# Patient Record
Sex: Female | Born: 1960 | Race: White | Hispanic: No | Marital: Married | Smoking: Former smoker
Health system: Southern US, Community
[De-identification: ages and names within clinical notes are randomized; demographics above are authoritative.]

## PROBLEM LIST (undated history)

## (undated) DIAGNOSIS — I10 Essential (primary) hypertension: Secondary | ICD-10-CM

## (undated) DIAGNOSIS — E785 Hyperlipidemia, unspecified: Secondary | ICD-10-CM

## (undated) DIAGNOSIS — I219 Acute myocardial infarction, unspecified: Secondary | ICD-10-CM

## (undated) HISTORY — DX: Hyperlipidemia, unspecified: E78.5

## (undated) HISTORY — DX: Essential (primary) hypertension: I10

## (undated) HISTORY — DX: Acute myocardial infarction, unspecified: I21.9

## (undated) HISTORY — PX: SPINE SURGERY: SHX786

---

## 1999-05-10 ENCOUNTER — Ambulatory Visit (HOSPITAL_COMMUNITY): Admission: RE | Admit: 1999-05-10 | Discharge: 1999-05-11 | Payer: Self-pay | Admitting: Neurosurgery

## 1999-07-21 ENCOUNTER — Encounter: Payer: Self-pay | Admitting: Urology

## 1999-07-21 ENCOUNTER — Encounter: Admission: RE | Admit: 1999-07-21 | Discharge: 1999-07-21 | Payer: Self-pay | Admitting: Urology

## 2007-02-21 ENCOUNTER — Other Ambulatory Visit: Payer: Self-pay

## 2007-02-21 ENCOUNTER — Emergency Department: Payer: Self-pay | Admitting: Internal Medicine

## 2007-02-21 DIAGNOSIS — I219 Acute myocardial infarction, unspecified: Secondary | ICD-10-CM

## 2007-02-21 HISTORY — DX: Acute myocardial infarction, unspecified: I21.9

## 2007-05-09 ENCOUNTER — Ambulatory Visit: Payer: Self-pay | Admitting: Internal Medicine

## 2007-05-09 LAB — HM MAMMOGRAPHY: HM Mammogram: NORMAL

## 2009-10-21 ENCOUNTER — Ambulatory Visit: Payer: Self-pay | Admitting: Internal Medicine

## 2009-10-27 ENCOUNTER — Ambulatory Visit: Payer: Self-pay | Admitting: Internal Medicine

## 2009-11-05 ENCOUNTER — Ambulatory Visit: Payer: Self-pay | Admitting: Internal Medicine

## 2010-09-30 IMAGING — CR DG KNEE COMPLETE 4+V*R*
1 series · 4 of 4 positions shown · non-contrast
Comparison: none

REASON FOR EXAM: fell twisted not able to bear weight
COMMENTS:

PROCEDURE:     MDR - MDR KNEE RT COMPLETE W/OBLIQUES  - October 21, 2009  [DATE]
RESULT:     No fracture, dislocation or other acute bony abnormality is
identified. The knee joint space is well maintained. The patella is intact.

[Series 1: view not recorded · 0.17mm/px · 4 of 4 slices shown]
[im 1/4]
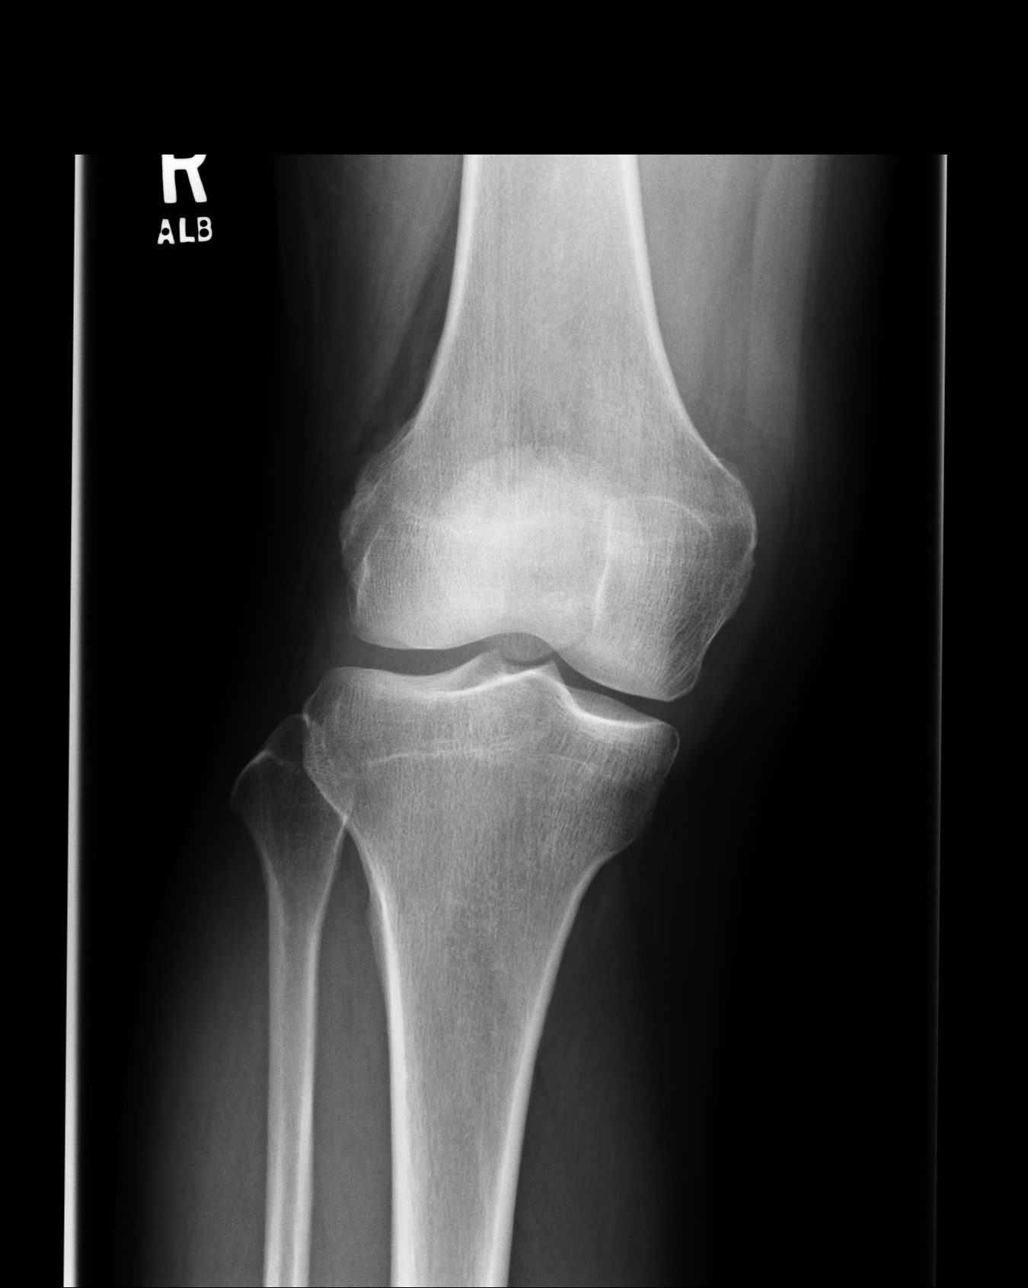
[im 2/4]
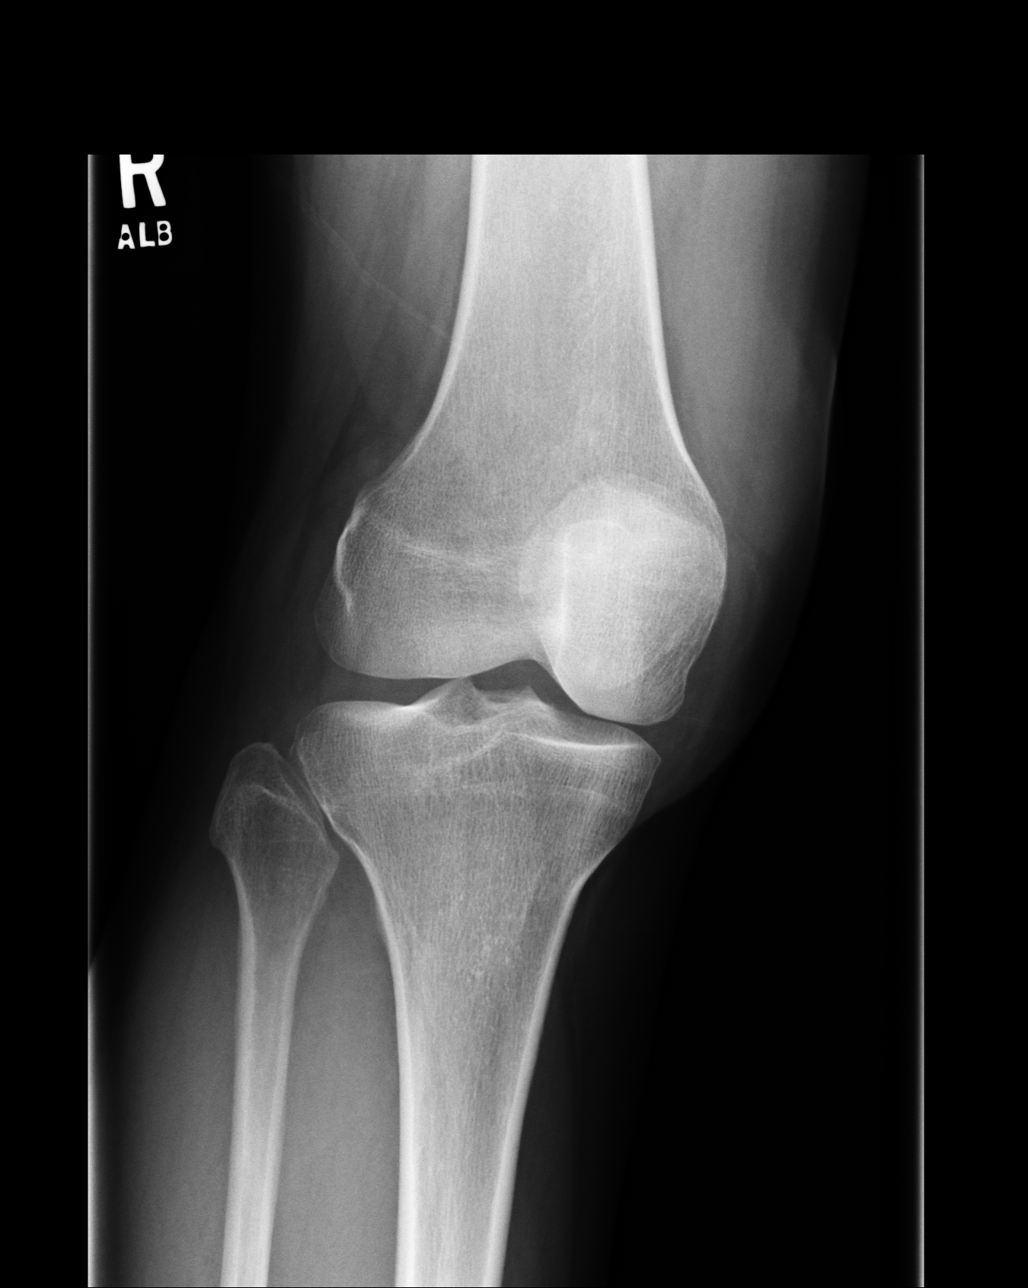
[im 3/4]
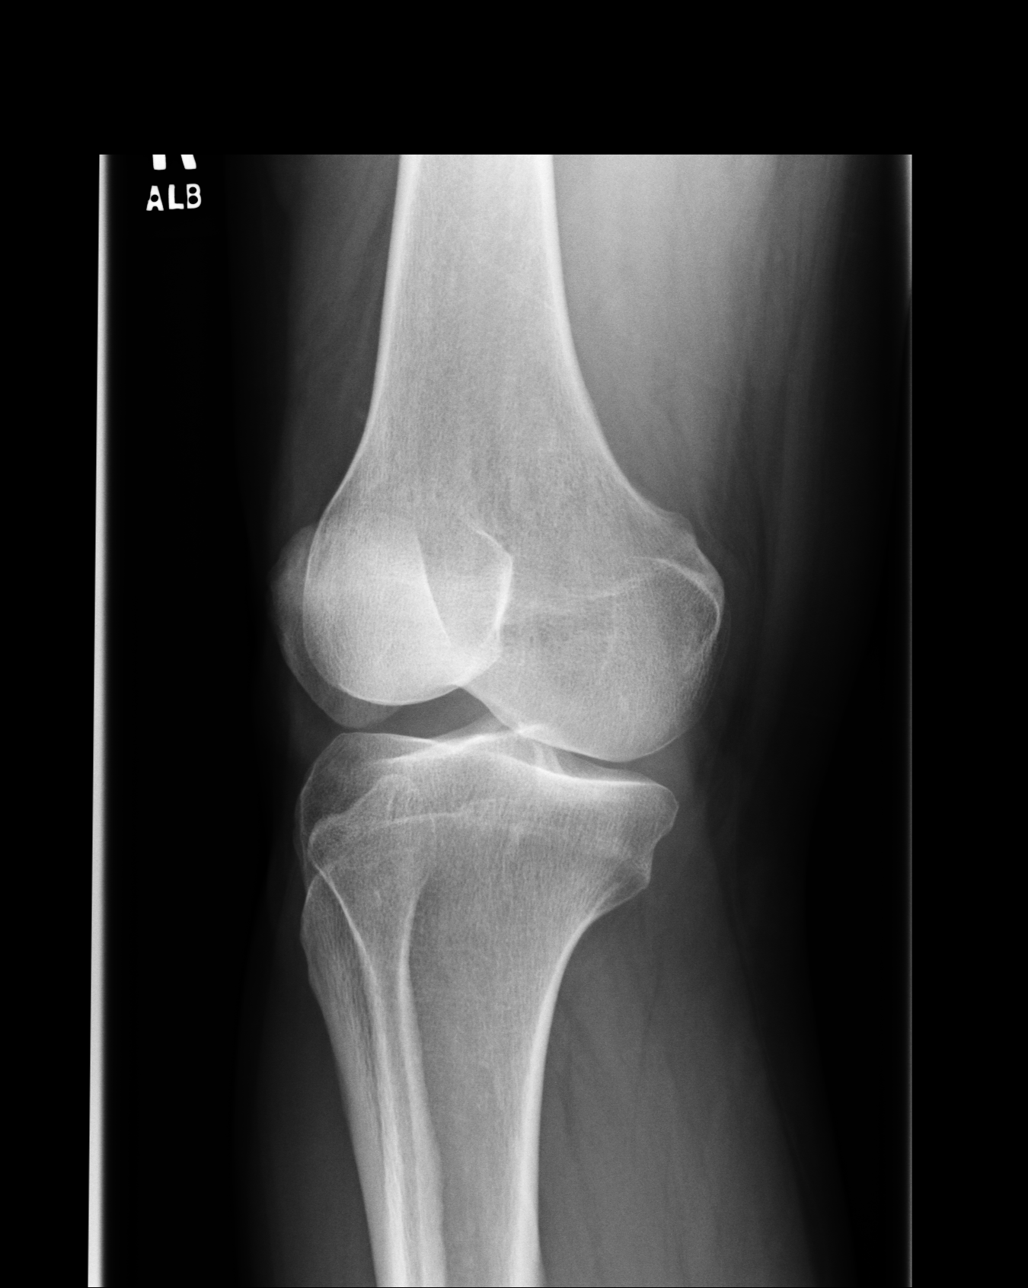
[im 4/4]
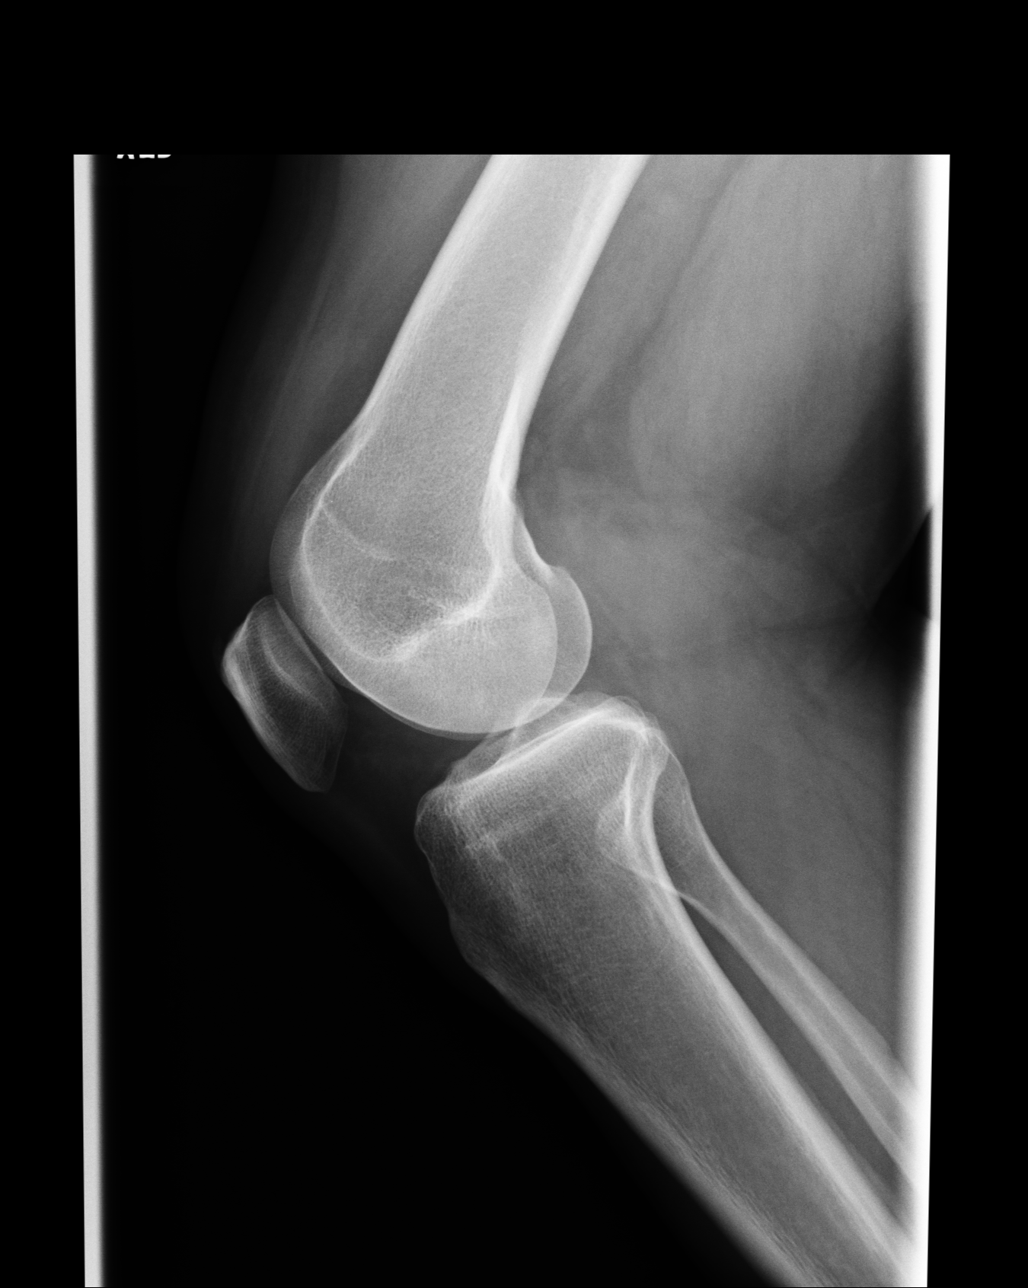

[4 of 4 positions shown; findings below may reference images not displayed]

IMPRESSION: No significant abnormalities are noted.

## 2010-11-17 ENCOUNTER — Ambulatory Visit: Payer: Self-pay | Admitting: Internal Medicine

## 2010-12-14 ENCOUNTER — Ambulatory Visit: Payer: Self-pay | Admitting: Internal Medicine

## 2011-01-14 ENCOUNTER — Other Ambulatory Visit: Payer: Self-pay | Admitting: *Deleted

## 2011-01-15 MED ORDER — ALPRAZOLAM 0.5 MG PO TABS: ORAL_TABLET | ORAL | Status: DC

## 2011-05-02 ENCOUNTER — Other Ambulatory Visit: Payer: Self-pay | Admitting: Internal Medicine

## 2011-05-03 ENCOUNTER — Other Ambulatory Visit: Payer: Self-pay | Admitting: Internal Medicine

## 2011-05-04 ENCOUNTER — Other Ambulatory Visit: Payer: Self-pay | Admitting: Internal Medicine

## 2011-05-04 MED ORDER — ATORVASTATIN CALCIUM 20 MG PO TABS: 20.0000 mg | ORAL_TABLET | Freq: Every day | ORAL | Status: DC

## 2011-05-04 MED ORDER — METOPROLOL SUCCINATE ER 100 MG PO TB24
100.0000 mg | ORAL_TABLET | Freq: Every day | ORAL | Status: DC
Start: 2011-05-03 — End: 2012-05-02

## 2011-05-04 MED ORDER — AMLODIPINE BESYLATE 10 MG PO TABS: 10.0000 mg | ORAL_TABLET | Freq: Every day | ORAL | Status: DC

## 2011-05-04 MED ORDER — CLOPIDOGREL BISULFATE 75 MG PO TABS: 75.0000 mg | ORAL_TABLET | Freq: Every day | ORAL | Status: AC

## 2011-05-18 ENCOUNTER — Telehealth: Payer: Self-pay | Admitting: Internal Medicine

## 2011-05-18 NOTE — Telephone Encounter (Signed)
Yes, ok to refill for 30 days only.  She has not been seen since I changed offices so i do not have her rx on file re frequency of dosing,

## 2011-05-18 NOTE — Telephone Encounter (Signed)
Patient requesting RF of xanax to [facility] [place]. OK to call in?

## 2011-05-19 NOTE — Telephone Encounter (Signed)
Called in.

## 2011-06-14 ENCOUNTER — Other Ambulatory Visit: Payer: Self-pay | Admitting: *Deleted

## 2011-06-15 ENCOUNTER — Other Ambulatory Visit: Payer: Self-pay | Admitting: *Deleted

## 2011-06-15 MED ORDER — ALPRAZOLAM 0.5 MG PO TABS: ORAL_TABLET | ORAL | Status: DC

## 2011-06-15 NOTE — Telephone Encounter (Signed)
Faxed request from [facility], last filled date not given.

## 2011-06-17 MED ORDER — ALPRAZOLAM 0.5 MG PO TABS: ORAL_TABLET | ORAL | Status: DC

## 2011-06-17 NOTE — Telephone Encounter (Signed)
Ok to refill per order.  Patient needs to be seen priore to any more refills

## 2011-06-17 NOTE — Telephone Encounter (Signed)
Medicine called to pharmacy.  They will let patient know that she needs to be seen.

## 2011-08-01 ENCOUNTER — Encounter: Payer: Self-pay | Admitting: Internal Medicine

## 2011-08-01 ENCOUNTER — Ambulatory Visit (INDEPENDENT_AMBULATORY_CARE_PROVIDER_SITE_OTHER): Payer: Managed Care, Other (non HMO) | Admitting: Internal Medicine

## 2011-08-01 DIAGNOSIS — Z79899 Other long term (current) drug therapy: Secondary | ICD-10-CM

## 2011-08-01 DIAGNOSIS — I10 Essential (primary) hypertension: Secondary | ICD-10-CM | POA: Insufficient documentation

## 2011-08-01 DIAGNOSIS — E785 Hyperlipidemia, unspecified: Secondary | ICD-10-CM | POA: Insufficient documentation

## 2011-08-01 DIAGNOSIS — F411 Generalized anxiety disorder: Secondary | ICD-10-CM

## 2011-08-01 DIAGNOSIS — R5383 Other fatigue: Secondary | ICD-10-CM

## 2011-08-01 DIAGNOSIS — I2510 Atherosclerotic heart disease of native coronary artery without angina pectoris: Secondary | ICD-10-CM | POA: Insufficient documentation

## 2011-08-01 DIAGNOSIS — R5381 Other malaise: Secondary | ICD-10-CM

## 2011-08-01 LAB — TSH: TSH: 0.94 u[IU]/mL (ref 0.35–5.50)

## 2011-08-01 MED ORDER — ALPRAZOLAM 0.5 MG PO TABS: ORAL_TABLET | ORAL | Status: DC

## 2011-08-01 NOTE — Assessment & Plan Note (Signed)
Well controlled on current medications.  No changes today. 

## 2011-08-01 NOTE — Assessment & Plan Note (Addendum)
Managed with low dose alprazolam once daily.  No changes today.  Refills given

## 2011-08-01 NOTE — Assessment & Plan Note (Signed)
Her LDL is 133, not at goal, on current Lipitor dose.  Willincrease Lipitor for goal LDl 70.

## 2011-08-01 NOTE — Progress Notes (Signed)
Subjective:    Patient ID: Sharon Melton, female    DOB: [birth year], 51 y.o.   MRN: [id number]  HPI Ms. Krass iis a 51 yr old white female with a history of CAD,  Remote tobacco abuse, and generalized anxiety who is here for 6 month follow up.  Her anxiety has improved.  She is sleeping better and experiencing less road rage on her current medication. She Continues to abstain from  Tobacco. No new issues,  No chest pain, exertional dyspnea.   Past Medical History  Diagnosis Date  . Myocardial infarction 02/2007    Inferior STEMI with bare metal stent to the PDA placed at [facility]  . Hypertension   . Hyperlipidemia    Current Outpatient Prescriptions on File Prior to Visit  Medication Sig Dispense Refill  . amLODipine (NORVASC) 10 MG tablet Take 1 tablet (10 mg total) by mouth daily.  30 tablet  3  . atorvastatin (LIPITOR) 20 MG tablet Take 1 tablet (20 mg total) by mouth daily.  30 tablet  5  . clopidogrel (PLAVIX) 75 MG tablet Take 1 tablet (75 mg total) by mouth daily.  90 tablet  3  . metoprolol (TOPROL XL) 100 MG 24 hr tablet Take 1 tablet (100 mg total) by mouth daily.  90 tablet  3   Review of Systems  Constitutional: Negative for fever, chills and unexpected weight change.  HENT: Negative for hearing loss, ear pain, nosebleeds, congestion, sore throat, facial swelling, rhinorrhea, sneezing, mouth sores, trouble swallowing, neck pain, neck stiffness, voice change, postnasal drip, sinus pressure, tinnitus and ear discharge.   Eyes: Negative for pain, discharge, redness and visual disturbance.  Respiratory: Negative for cough, chest tightness, shortness of breath, wheezing and stridor.   Cardiovascular: Negative for chest pain, palpitations and leg swelling.  Musculoskeletal: Negative for myalgias and arthralgias.  Skin: Negative for color change and rash.  Neurological: Negative for dizziness, weakness, light-headedness and headaches.  Hematological: Negative for adenopathy.      Objective:   Physical Exam  Constitutional: She is oriented to person, place, and time. She appears well-developed and well-nourished.  HENT:  Mouth/Throat: Oropharynx is clear and moist.  Eyes: EOM are normal. Pupils are equal, round, and reactive to light. No scleral icterus.  Neck: Normal range of motion. Neck supple. No JVD present. No thyromegaly present.  Cardiovascular: Normal rate, regular rhythm, normal heart sounds and intact distal pulses.   Pulmonary/Chest: Effort normal and breath sounds normal.  Abdominal: Soft. Bowel sounds are normal. She exhibits no mass. There is no tenderness.  Musculoskeletal: Normal range of motion. She exhibits no edema.  Lymphadenopathy:    She has no cervical adenopathy.  Neurological: She is alert and oriented to person, place, and time.  Skin: Skin is warm and dry.  Psychiatric: She has a normal mood and affect.      Assessment & Plan:   Generalized anxiety disorder Managed with low dose alprazolam once daily.  No changes today.  Refills given  Hypertension diastolic Well controlled on current medications.  No changes today.  Hyperlipidemia LDL goal < 70 Her LDL is 133, not at goal, on current Lipitor dose.  Willincrease Lipitor for goal LDl 70.  Coronary artery disease With prior PTCA/stent ,  No recent follwoup but no recent chest pain.  Continue statin, asa plavix, and beta blocker.      Updated Medication List Outpatient Encounter Prescriptions as of 08/01/2011  Medication Sig Dispense Refill  . ALPRAZolam (XANAX) 0.5 MG  tablet Take 1/2 to 1 tablet by mouth daily as needed for panic symptoms.  30 tablet  5  . amLODipine (NORVASC) 10 MG tablet Take 1 tablet (10 mg total) by mouth daily.  30 tablet  3  . atorvastatin (LIPITOR) 20 MG tablet Take 1 tablet (20 mg total) by mouth daily.  30 tablet  5  . clopidogrel (PLAVIX) 75 MG tablet Take 1 tablet (75 mg total) by mouth daily.  90 tablet  3  . metoprolol (TOPROL XL) 100 MG 24 hr  tablet Take 1 tablet (100 mg total) by mouth daily.  90 tablet  3  . DISCONTD: ALPRAZolam (XANAX) 0.5 MG tablet Take 1/2 to 1 tablet by mouth daily as needed for panic symptoms.  30 tablet  1  . DISCONTD: atorvastatin (LIPITOR) 20 MG tablet Take 1 tablet (20 mg total) by mouth daily.  90 tablet  3

## 2011-08-01 NOTE — Patient Instructions (Signed)
Return in July/August for your annual physical with smear.   We will send your your labs via MyChart if you sign up today

## 2011-08-01 NOTE — Assessment & Plan Note (Signed)
With prior PTCA/stent ,  No recent follwoup but no recent chest pain.  Continue statin, asa plavix, and beta blocker.

## 2011-08-02 ENCOUNTER — Other Ambulatory Visit: Payer: Self-pay | Admitting: Internal Medicine

## 2011-08-02 DIAGNOSIS — E785 Hyperlipidemia, unspecified: Secondary | ICD-10-CM

## 2011-08-02 LAB — COMPLETE METABOLIC PANEL WITH GFR
Albumin: 4.7 g/dL (ref 3.5–5.2)
CO2: 24 mEq/L (ref 19–32)
Chloride: 105 mEq/L (ref 96–112)
GFR, Est African American: >89 mL/min
GFR, Est Non African American: >89 mL/min
Glucose, Bld: 173 mg/dL — ABNORMAL HIGH (ref 70–99)
Potassium: 4.3 mEq/L (ref 3.5–5.3)
Sodium: 138 mEq/L (ref 135–145)
Total Protein: 6.9 g/dL (ref 6.0–8.3)

## 2011-08-02 MED ORDER — ATORVASTATIN CALCIUM 40 MG PO TABS: 40.0000 mg | ORAL_TABLET | Freq: Every day | ORAL | Status: DC

## 2011-08-12 ENCOUNTER — Encounter: Payer: Self-pay | Admitting: Internal Medicine

## 2011-08-25 ENCOUNTER — Other Ambulatory Visit: Payer: Self-pay | Admitting: Internal Medicine

## 2011-08-25 MED ORDER — LOSARTAN POTASSIUM 100 MG PO TABS: 100.0000 mg | ORAL_TABLET | Freq: Every day | ORAL | Status: DC

## 2011-10-03 ENCOUNTER — Other Ambulatory Visit: Payer: Self-pay | Admitting: Internal Medicine

## 2011-10-03 MED ORDER — ESCITALOPRAM OXALATE 10 MG PO TABS: 10.0000 mg | ORAL_TABLET | Freq: Every day | ORAL | Status: DC

## 2011-10-03 MED ORDER — AMLODIPINE BESYLATE 10 MG PO TABS: 10.0000 mg | ORAL_TABLET | Freq: Every day | ORAL | Status: DC

## 2011-12-01 ENCOUNTER — Encounter: Payer: Self-pay | Admitting: Internal Medicine

## 2011-12-01 ENCOUNTER — Telehealth: Payer: Self-pay | Admitting: Internal Medicine

## 2011-12-01 ENCOUNTER — Ambulatory Visit (INDEPENDENT_AMBULATORY_CARE_PROVIDER_SITE_OTHER): Payer: Managed Care, Other (non HMO) | Admitting: Internal Medicine

## 2011-12-01 VITALS — BP 122/66 | HR 66 | Temp 98.4°F | Resp 16 | Wt 179.5 lb

## 2011-12-01 DIAGNOSIS — Z1239 Encounter for other screening for malignant neoplasm of breast: Secondary | ICD-10-CM

## 2011-12-01 DIAGNOSIS — E119 Type 2 diabetes mellitus without complications: Secondary | ICD-10-CM | POA: Insufficient documentation

## 2011-12-01 DIAGNOSIS — R7309 Other abnormal glucose: Secondary | ICD-10-CM

## 2011-12-01 DIAGNOSIS — Z1211 Encounter for screening for malignant neoplasm of colon: Secondary | ICD-10-CM

## 2011-12-01 DIAGNOSIS — E1165 Type 2 diabetes mellitus with hyperglycemia: Secondary | ICD-10-CM

## 2011-12-01 DIAGNOSIS — R739 Hyperglycemia, unspecified: Secondary | ICD-10-CM

## 2011-12-01 DIAGNOSIS — I2510 Atherosclerotic heart disease of native coronary artery without angina pectoris: Secondary | ICD-10-CM

## 2011-12-01 DIAGNOSIS — E669 Obesity, unspecified: Secondary | ICD-10-CM | POA: Insufficient documentation

## 2011-12-01 DIAGNOSIS — IMO0001 Reserved for inherently not codable concepts without codable children: Secondary | ICD-10-CM

## 2011-12-01 DIAGNOSIS — E785 Hyperlipidemia, unspecified: Secondary | ICD-10-CM

## 2011-12-01 DIAGNOSIS — I10 Essential (primary) hypertension: Secondary | ICD-10-CM

## 2011-12-01 LAB — COMPREHENSIVE METABOLIC PANEL
ALT: 25 U/L (ref 0–35)
Albumin: 4.2 g/dL (ref 3.5–5.2)
CO2: 25 mEq/L (ref 19–32)
Chloride: 102 mEq/L (ref 96–112)
GFR: 87.36 mL/min (ref >60.00)
Glucose, Bld: 220 mg/dL — ABNORMAL HIGH (ref 70–99)
Potassium: 4 mEq/L (ref 3.5–5.1)
Sodium: 137 mEq/L (ref 135–145)
Total Bilirubin: 0.4 mg/dL (ref 0.3–1.2)
Total Protein: 7.3 g/dL (ref 6.0–8.3)

## 2011-12-01 LAB — HEMOGLOBIN A1C: Hgb A1c MFr Bld: 9.9 % — ABNORMAL HIGH (ref 4.6–6.5)

## 2011-12-01 MED ORDER — INSULIN DETEMIR 100 UNIT/ML ~~LOC~~ SOLN: 15.0000 [IU] | Freq: Every day | SUBCUTANEOUS | Status: DC

## 2011-12-01 MED ORDER — METFORMIN HCL 500 MG PO TABS: 500.0000 mg | ORAL_TABLET | Freq: Two times a day (BID) | ORAL | Status: DC

## 2011-12-01 NOTE — Assessment & Plan Note (Signed)
Last LDl was above goal and lipitor dose was increased.  Repeat is due.

## 2011-12-01 NOTE — Telephone Encounter (Signed)
Sharon Melton's test for diabetes was positive.  She will need to take insulin for a little while to get her sugars under control ASAP while we get medications (opral) on board.  I have called metfromin in to her pharamcy, to taek twice daily, but pleaase have her come back fora nurse visit to 1) learn how to check blood suagrs and 2) give herself insulin  Shot with Levemir use, if we have samples, preferably, Or Lantus, starting with 15 units daily. Also needs an appt in 3 weeks with me to see how things are going.  The diet I printed out for her at her visit is going to help a lot.

## 2011-12-01 NOTE — Assessment & Plan Note (Signed)
Screening discussed.,  Deferring colonoscopy.  Stool cards.

## 2011-12-01 NOTE — Assessment & Plan Note (Addendum)
2 prior stents placed at [facility] 2008.  She is asymptomatic,  Has had no cardiology follow up in years,  Has been tobacco free for 5 years,  Since her

## 2011-12-01 NOTE — Assessment & Plan Note (Signed)
I have addressed  BMI and recommended a low glycemic index diet utilizing smaller more frequent meals to increase metabolism.  I have also recommended that patient start exercising with a goal of 30 minutes of aerobic exercise a minimum of 5 days per week. Screening for lipid disorders, thyroid and diabetes to be done today.   

## 2011-12-01 NOTE — Assessment & Plan Note (Signed)
New diagnosis, with A1c 9.9.  Will start basal for initial management until INR is < 7.5 and transition to oral meds and low GI diet.

## 2011-12-01 NOTE — Assessment & Plan Note (Signed)
Well controlled on current regimen. Renal function stable, no changes today. 

## 2011-12-01 NOTE — Progress Notes (Signed)
Patient ID: Sharon Melton, female   DOB: 1960/08/29, 51 y.o.   MRN: [id number]  Patient Active Problem List  Diagnosis  . Generalized anxiety disorder  . Hypertension diastolic  . Hyperlipidemia LDL goal < 70  . Coronary artery disease  . Screening for colon cancer  . Obesity  . Diabetes mellitus type II, uncontrolled    Subjective:  CC:   Chief Complaint  Patient presents with  . Follow-up    4 month    HPI:   Sharon Melton a 51 y.o. female who presents for 6 month follow up on chronic conditions.  She has had a lot of family stressors recently, and continues to worry about her job being cut.  Her maternal uncle in [place] became very ill,  And she had to go there to sign a DNR.  Her MGM passed away 3 months ago and because she did not have the $5K for attorney/court fees to obtain general guardianship,  her grandmother's estate went to the state.  Despite these setbacks she reports continued adequate sleep using the medication prescribed .  She does report fatigue band wight gain but is not watching her diet or getting regular or even sporadic exercise. She has a history of CAD but denies chest pain, orthopnea and fluid retention. Continued abstinence from tobacco and alcohol.      Past Medical History  Diagnosis Date  . Myocardial infarction 02/2007    Inferior STEMI with bare metal stent to the PDA placed at [facility]  . Hypertension   . Hyperlipidemia     Past Surgical History  Procedure Date  . Spine surgery     lumbar      The following portions of the patient's history were reviewed and updated as appropriate: Allergies, current medications, and problem list.    Review of Systems:   12 Pt  review of systems was negative except those addressed in the HPI,     History   Social History  . Marital Status: Married    Spouse Name: N/A    Number of Children: N/A  . Years of Education: N/A   Occupational History  . Not on file.   Social History Main  Topics  . Smoking status: Former Smoker    Quit date: 02/21/2007  . Smokeless tobacco: Never Used  . Alcohol Use: No  . Drug Use: Yes     occasional  . Sexually Active: Not on file   Other Topics Concern  . Not on file   Social History Narrative  . No narrative on file    Objective:  BP 122/66  Pulse 66  Temp 98.4 F (36.9 C) (Oral)  Resp 16  Wt 179 lb 8 oz (81.421 kg)  SpO2 95%  General appearance: alert, cooperative and appears stated age Ears: normal TM's and external ear canals both ears Throat: lips, mucosa, and tongue normal; teeth and gums normal Neck: no adenopathy, no carotid bruit, supple, symmetrical, trachea midline and thyroid not enlarged, symmetric, no tenderness/mass/nodules Back: symmetric, no curvature. ROM normal. No CVA tenderness. Lungs: clear to auscultation bilaterally Heart: regular rate and rhythm, S1, S2 normal, no murmur, click, rub or gallop Abdomen: soft, non-tender; bowel sounds normal; no masses,  no organomegaly Pulses: 2+ and symmetric Skin: Skin color, texture, turgor normal. No rashes or lesions Lymph nodes: Cervical, supraclavicular, and axillary nodes normal.  Assessment and Plan:  Screening for colon cancer Screening discussed.,  Deferring colonoscopy.  Stool cards.  Coronary artery disease 2 prior stents placed at [facility] 2008.  She is asymptomatic,  Has had no cardiology follow up in years,  Has been tobacco free for 5 years,  Since her   Hyperlipidemia LDL goal < 70 Last LDl was above goal and lipitor dose was increased.  Repeat is due.   Hypertension diastolic Well controlled on current regimen. Renal function stable, no changes today.  Obesity I have addressed  BMI and recommended a low glycemic index diet utilizing smaller more frequent meals to increase metabolism.  I have also recommended that patient start exercising with a goal of 30 minutes of aerobic exercise a minimum of 5 days per week. Screening for lipid  disorders, thyroid and diabetes to be done today.    Diabetes mellitus type II, uncontrolled New diagnosis, with A1c 9.9.  Will start basal for initial management until INR is < 7.5 and transition to oral meds and low GI diet.    Updated Medication List Outpatient Encounter Prescriptions as of 12/01/2011  Medication Sig Dispense Refill  . ALPRAZolam (XANAX) 0.5 MG tablet Take 1/2 to 1 tablet by mouth daily as needed for panic symptoms.  30 tablet  5  . amLODipine (NORVASC) 10 MG tablet Take 1 tablet (10 mg total) by mouth daily.  30 tablet  3  . atorvastatin (LIPITOR) 40 MG tablet Take 1 tablet (40 mg total) by mouth daily.  30 tablet  5  . clopidogrel (PLAVIX) 75 MG tablet Take 1 tablet (75 mg total) by mouth daily.  90 tablet  3  . escitalopram (LEXAPRO) 10 MG tablet Take 1 tablet (10 mg total) by mouth daily.  30 tablet  3  . losartan (COZAAR) 100 MG tablet Take 1 tablet (100 mg total) by mouth daily.  30 tablet  6  . metoprolol (TOPROL XL) 100 MG 24 hr tablet Take 1 tablet (100 mg total) by mouth daily.  90 tablet  3  . insulin detemir (LEVEMIR) 100 UNIT/ML injection Inject 15 Units into the skin at bedtime.  10 mL  12  . metFORMIN (GLUCOPHAGE) 500 MG tablet Take 1 tablet (500 mg total) by mouth 2 (two) times daily with a meal.  180 tablet  3     Orders Placed This Encounter  Procedures  . HM MAMMOGRAPHY  . MM Digital Screening  . Hemoglobin A1c  . Comprehensive metabolic panel    No Follow-up on file.

## 2011-12-01 NOTE — Patient Instructions (Addendum)
Consider the Low Glycemic Index Diet and 6 smaller meals daily .  This boosts your metabolism and regulates your sugars:   7 AM Low carbohydrate Protein  Shakes (EAS AdvantEdge Carb Control  Or Atkins ,  Available everywhere,   In  cases at [employer] )  2.5 carbs  (Add or substitute a toasted sandwhich thin w/ peanut butter) Avoid, cereal/bananas.  Try scrambled eggs in a low carb tortilla  10 AM: Protein bar by Atkins (snack size,  Chocolate lover's variety at  [employer])    Lunch: sandwich on pita bread or flatbread (Joseph's makes a pita bread and a flat bread , available at [employer] and [employer]; Toufayan makes a low carb flatbread available at [employer] and [employer]) Mission makes a low carb whole wheat tortilla available at [employer] most grocery stores   3 PM:  Mid day :  Another protein bar,  Or a  cheese stick, 1/4 cup of almonds, walnuts, pistachios, pecans, peanuts,  Macadamia nuts  6 PM  Dinner:  "mean and green:"  Meat/chicken/fish, salad, and green veggie : use ranch, vinagrette,  Blue cheese, etc  9 PM snack : Breyer's low carb fudgsicle or  ice cream bar (Carb Smart), or  Weight Watcher's ice cream bar , or another protein shake

## 2011-12-02 NOTE — Telephone Encounter (Signed)
Left message asking patient to call back

## 2011-12-07 NOTE — Telephone Encounter (Signed)
Patient notified. She will come in tomorrow for nurse visit. Samples of Levemir set aside

## 2011-12-08 ENCOUNTER — Ambulatory Visit (INDEPENDENT_AMBULATORY_CARE_PROVIDER_SITE_OTHER): Payer: Managed Care, Other (non HMO) | Admitting: *Deleted

## 2011-12-08 DIAGNOSIS — E119 Type 2 diabetes mellitus without complications: Secondary | ICD-10-CM

## 2011-12-08 MED ORDER — INSULIN PEN NEEDLE 32G X 4 MM MISC: Status: DC

## 2011-12-12 ENCOUNTER — Ambulatory Visit: Payer: Managed Care, Other (non HMO)

## 2011-12-28 ENCOUNTER — Ambulatory Visit: Payer: Self-pay | Admitting: Internal Medicine

## 2011-12-28 LAB — FECAL OCCULT BLOOD, GUAIAC: Fecal Occult Blood: NEGATIVE

## 2011-12-29 ENCOUNTER — Encounter: Payer: Self-pay | Admitting: Internal Medicine

## 2011-12-29 ENCOUNTER — Other Ambulatory Visit: Payer: Managed Care, Other (non HMO)

## 2011-12-29 ENCOUNTER — Other Ambulatory Visit: Payer: Self-pay | Admitting: Internal Medicine

## 2011-12-29 ENCOUNTER — Ambulatory Visit (INDEPENDENT_AMBULATORY_CARE_PROVIDER_SITE_OTHER): Payer: Managed Care, Other (non HMO) | Admitting: Internal Medicine

## 2011-12-29 VITALS — BP 116/62 | HR 70 | Temp 98.0°F | Resp 16 | Wt 172.0 lb

## 2011-12-29 DIAGNOSIS — E669 Obesity, unspecified: Secondary | ICD-10-CM

## 2011-12-29 DIAGNOSIS — E785 Hyperlipidemia, unspecified: Secondary | ICD-10-CM

## 2011-12-29 DIAGNOSIS — E1165 Type 2 diabetes mellitus with hyperglycemia: Secondary | ICD-10-CM

## 2011-12-29 DIAGNOSIS — Z1211 Encounter for screening for malignant neoplasm of colon: Secondary | ICD-10-CM

## 2011-12-29 DIAGNOSIS — IMO0002 Reserved for concepts with insufficient information to code with codable children: Secondary | ICD-10-CM

## 2011-12-29 LAB — FECAL OCCULT BLOOD, IMMUNOCHEMICAL: Fecal Occult Bld: NEGATIVE

## 2011-12-29 MED ORDER — ATORVASTATIN CALCIUM 80 MG PO TABS: 80.0000 mg | ORAL_TABLET | Freq: Every day | ORAL | Status: DC

## 2011-12-29 NOTE — Patient Instructions (Addendum)
Please check your blood sugars twice daily for the next week, at the following times:  1) fasting (in the morning before you eat)  2) 2 hours after any meal (this is called a "post prandial" )  Record the readings in your log book .  Send me the readings either by mail, fax or e mail so we can decide if it is time to stop your insulin   If your readings are all over 200,  Increase the insulin dose by 5 units immediately.     I have increased your Lipitor dose to 80 mg to lower your LDL to the accepted goal of 70.  We will recheck your A1c and fasting lipids in October., before you return

## 2011-12-31 ENCOUNTER — Encounter: Payer: Self-pay | Admitting: Internal Medicine

## 2011-12-31 NOTE — Assessment & Plan Note (Signed)
Lipitor dose was increased in July to 80 mg for LDL of 133.

## 2011-12-31 NOTE — Assessment & Plan Note (Signed)
She has already lost 5 lbs following a low GI diet.  Encouragement and pointers given

## 2011-12-31 NOTE — Assessment & Plan Note (Addendum)
Diagonsed with hgba1c of 9.9 and fasting glucose of 220 on July 22 .  She has been using Levemir  and metformin now for 3 weeks and has lost 5 lbs following a low glycemic index diet. Will transition to glipizide and metformin in the next few weeks depending on whether her blood sugars are consistently < 200 on current regimen.  LDL was above goal at that time at 133 and lipitor was increased to 80 mg daily .  Repeat a1c and lipids  In November .  Reminder for annual eye exam given.

## 2011-12-31 NOTE — Progress Notes (Signed)
Patient ID: Sharon Melton, female   DOB: [birth year], 51 y.o.   MRN: [id number]  Subjective:     Sharon Melton is a 51 y.o. female who presents with new onset of Type 2 diabetes.. Current symptoms include: none. Patient denies foot ulcerations, paresthesia of the feet, visual disturbances and weight loss. Evaluation to date has been: fasting blood sugar, fasting lipid panel and hemoglobin A1C. Home sugars: patient does not check sugars. Current treatments: more intensive attention to diet which has been too early to assess effectiveness, Started insulin which has been too early to assess effectiveness and Started metformin which has been too early to assess effectiveness. Last dilated eye exam unknown.  The following portions of the patient's history were reviewed and updated as appropriate: allergies, current medications, past family history, past medical history, past social history, past surgical history and problem list.  Review of Systems A comprehensive review of systems was negative.    Objective:    BP 116/62  Pulse 70  Temp 98 F (36.7 C) (Oral)  Resp 16  Wt 172 lb (78.019 kg)  SpO2 95%  General Appearance:    Alert, cooperative, no distress, appears stated age  Throat:   Lips, mucosa, and tongue normal; teeth and gums normal  Neck:   Supple, symmetrical, trachea midline, no adenopathy;    thyroid:  no enlargement/tenderness/nodules; no carotid   bruit or JVD  Lungs:     Clear to auscultation bilaterally, respirations unlabored   Heart:    Regular rate and rhythm, S1 and S2 normal, no murmur, rub   or gallop  Abdomen:     Soft, non-tender, bowel sounds active all four quadrants,    no masses, no organomegaly  Extremities:   Extremities normal, atraumatic, no cyanosis or edema  Pulses:   2+ and symmetric all extremities  Skin:   Skin color, texture, turgor normal, no rashes or lesions  Lymph nodes:   Cervical, supraclavicular, and axillary nodes normal  Neurologic:   Foot  exam:  Nails are well trimmed,  No callouses,  Sensation intact to microfilament        Patient was evaluated for proper footwear and sizing.  Laboratory: No components found with this basename: A1C      Assessment:   Diabetes mellitus type II, uncontrolled Diagonsed with hgba1c of 9.9 and fasting glucose of 220 on July 22 .  She has been using Levemir  and metformin now for 3 weeks and has lost 5 lbs following a low glycemic index diet. Will transition to glipizide and metformin in the next few weeks depending on whether her blood sugars are consistently < 200 on current regimen.  LDL was above goal at that time at 133 and lipitor was increased to 80 mg daily .  Repeat a1c and lipids  In November .  Reminder for annual eye exam given.    Hyperlipidemia LDL goal < 70 Lipitor dose was increased in July to 80 mg for LDL of 133.    Obesity She has already lost 5 lbs following a low GI diet.  Encouragement and pointers given    Updated Medication List Outpatient Encounter Prescriptions as of 12/29/2011  Medication Sig Dispense Refill  . ALPRAZolam (XANAX) 0.5 MG tablet Take 1/2 to 1 tablet by mouth daily as needed for panic symptoms.  30 tablet  5  . amLODipine (NORVASC) 10 MG tablet Take 1 tablet (10 mg total) by mouth daily.  30 tablet  3  .  atorvastatin (LIPITOR) 80 MG tablet Take 1 tablet (80 mg total) by mouth daily.  30 tablet  5  . clopidogrel (PLAVIX) 75 MG tablet Take 1 tablet (75 mg total) by mouth daily.  90 tablet  3  . escitalopram (LEXAPRO) 10 MG tablet Take 1 tablet (10 mg total) by mouth daily.  30 tablet  3  . insulin detemir (LEVEMIR) 100 UNIT/ML injection Inject 15 Units into the skin at bedtime.  10 mL  12  . Insulin Pen Needle 32G X 4 MM MISC Use as directed once daily  100 each  1  . losartan (COZAAR) 100 MG tablet Take 1 tablet (100 mg total) by mouth daily.  30 tablet  6  . metFORMIN (GLUCOPHAGE) 500 MG tablet Take 1 tablet (500 mg total) by mouth 2 (two) times  daily with a meal.  180 tablet  3  . metoprolol (TOPROL XL) 100 MG 24 hr tablet Take 1 tablet (100 mg total) by mouth daily.  90 tablet  3  . DISCONTD: atorvastatin (LIPITOR) 40 MG tablet Take 1 tablet (40 mg total) by mouth daily.  30 tablet  5

## 2012-01-10 ENCOUNTER — Encounter: Payer: Self-pay | Admitting: Internal Medicine

## 2012-01-14 ENCOUNTER — Encounter: Payer: Self-pay | Admitting: Internal Medicine

## 2012-01-30 ENCOUNTER — Other Ambulatory Visit: Payer: Self-pay | Admitting: Internal Medicine

## 2012-01-31 ENCOUNTER — Other Ambulatory Visit: Payer: Self-pay | Admitting: Internal Medicine

## 2012-02-01 MED ORDER — ALPRAZOLAM 0.5 MG PO TABS: ORAL_TABLET | ORAL | Status: DC

## 2012-03-06 ENCOUNTER — Encounter: Payer: Self-pay | Admitting: Internal Medicine

## 2012-03-08 ENCOUNTER — Telehealth: Payer: Self-pay

## 2012-03-08 ENCOUNTER — Encounter: Payer: Self-pay | Admitting: Internal Medicine

## 2012-03-08 ENCOUNTER — Ambulatory Visit: Payer: Self-pay | Admitting: Internal Medicine

## 2012-03-08 NOTE — Telephone Encounter (Signed)
Patient came in office today to pick up samples Janumet, Bystolic 5 mg, Edarbi, Livalo. Patient stated that you ca

## 2012-03-08 NOTE — Progress Notes (Signed)
Patient ID: Sharon Melton, female   DOB: 1961-03-22, 51 y.o.   MRN: [id number]   Patient Active Problem List  Diagnosis  . Generalized anxiety disorder  . Hypertension diastolic  . Hyperlipidemia LDL goal < 70  . Coronary artery disease  . Screening for colon cancer  . Obesity  . Diabetes mellitus type II, uncontrolled    Subjective:  CC:   No chief complaint on file.   HPI:   Sharon Oler Mullenis a 51 y.o. female who was brought in today to make substitutions for her medications since she has lost her job and cannot afford her medications without assistance.    Past Medical History  Diagnosis Date  . Myocardial infarction 02/2007    Inferior STEMI with bare metal stent to the PDA placed at [facility]  . Hypertension   . Hyperlipidemia   . Diabetes mellitus     Past Surgical History  Procedure Date  . Spine surgery     lumbar      The following portions of the patient's history were reviewed and updated as appropriate: Allergies, current medications, and problem list.    History   Social History  . Marital Status: Married    Spouse Name: N/A    Number of Children: N/A  . Years of Education: N/A   Occupational History  . Not on file.   Social History Main Topics  . Smoking status: Former Smoker    Quit date: 02/21/2007  . Smokeless tobacco: Never Used  . Alcohol Use: No  . Drug Use: Yes     occasional  . Sexually Active: Not on file   Other Topics Concern  . Not on file   Social History Narrative  . No narrative on file    Objective:  There were no vitals taken for this visit. Mr exam was done today  Assessment and Plan:  No problem-specific assessment & plan notes found for this encounter.   Updated Medication List Outpatient Encounter Prescriptions as of 03/08/2012  Medication Sig Dispense Refill  . ALPRAZolam (XANAX) 0.5 MG tablet Take 1/2 to 1 tablet by mouth daily as needed for panic symptoms.  30 tablet  5  . amLODipine (NORVASC)  10 MG tablet TAKE 1 TABLET (10 MG TOTAL) BY MOUTH DAILY.  30 tablet  11  . atorvastatin (LIPITOR) 80 MG tablet Take 1 tablet (80 mg total) by mouth daily.  30 tablet  5  . clopidogrel (PLAVIX) 75 MG tablet Take 1 tablet (75 mg total) by mouth daily.  90 tablet  3  . escitalopram (LEXAPRO) 10 MG tablet TAKE 1 TABLET (10 MG TOTAL) BY MOUTH DAILY.  30 tablet  3  . insulin detemir (LEVEMIR) 100 UNIT/ML injection Inject 15 Units into the skin at bedtime.  10 mL  12  . Insulin Pen Needle 32G X 4 MM MISC Use as directed once daily  100 each  1  . losartan (COZAAR) 100 MG tablet Take 1 tablet (100 mg total) by mouth daily.  30 tablet  6  . metFORMIN (GLUCOPHAGE) 500 MG tablet Take 1 tablet (500 mg total) by mouth 2 (two) times daily with a meal.  180 tablet  3  . metoprolol (TOPROL XL) 100 MG 24 hr tablet Take 1 tablet (100 mg total) by mouth daily.  90 tablet  3   For her cholesterol:  She was given samples of Livalo 4 mg as a substitution for her age atorvastatin 1 tablet daily.  For  her hypertension:  She was given samples of Edarbi 80 mg as a substitution for losartan 1 tablet daily.  She was given samples of Bystolic c 5 mg as a substitution for metoprolol succinate 1 tablet daily.  She was instructed to suspend amlodipine and metoprolol initially when she substitutes Edarbi for losartan.  If her blood pressure was greater than 140/80 after one week of the [place] she was instructed to resume metoprolol or starch by systolic.  If after an additional week of Edarbi and Bystolic her blood pressure was still elevated she will call for further instructions  For her diabetes:  She was given samples of Janumet  50/1000 as a substitution for metformin and Levemir. One tablet twice daily. She was instructed to suspend her Levemir until she has been on the Janumet for one week. If her blood sugars were were still above 150 she would resume the Levemir. She was given 3 Levemir quickpens

## 2012-03-08 NOTE — Patient Instructions (Addendum)
BP: We are substituting Edarbi for Losartan. One tablet daily  Do not take amlodipine or metoprolol until you have been taking the Edarbi for a week.  If your BP is > 140 /80 after one week.  Start the Bystolic (once daily)  which takes the place of metoprolol.  You may not need amlodipine anymore but if after 1 week of edarbi and bystolic your bp is still > 150,   email me your pulse and bp  .   We are substituting Livalo for atorvastatin for cholesterol.  One tablet daily   For your diabetes,  We are going to try Janumet instead of metformin ,  One pill Twice daily .To see if we can get you off the levemir (or at least make the insulin pens last longer) Suspend the levemir until you are on the janumet for a week to prevent low blood sugars.     Livalo   4mg   #60  Janumet 50/1000   #56  Edarbi 80 mg    Around #84  Bystolic 5 mg   #56

## 2012-03-12 ENCOUNTER — Ambulatory Visit: Payer: Managed Care, Other (non HMO) | Admitting: Internal Medicine

## 2012-03-26 ENCOUNTER — Encounter: Payer: Self-pay | Admitting: Internal Medicine

## 2012-07-07 ENCOUNTER — Other Ambulatory Visit: Payer: Self-pay

## 2012-07-09 ENCOUNTER — Encounter: Payer: Self-pay | Admitting: Internal Medicine

## 2012-07-09 ENCOUNTER — Ambulatory Visit (INDEPENDENT_AMBULATORY_CARE_PROVIDER_SITE_OTHER): Payer: Self-pay | Admitting: Internal Medicine

## 2012-07-09 VITALS — BP 148/82 | HR 64 | Temp 97.6°F | Resp 16 | Wt 164.5 lb

## 2012-07-09 DIAGNOSIS — I10 Essential (primary) hypertension: Secondary | ICD-10-CM

## 2012-07-09 DIAGNOSIS — I2510 Atherosclerotic heart disease of native coronary artery without angina pectoris: Secondary | ICD-10-CM

## 2012-07-09 DIAGNOSIS — E1165 Type 2 diabetes mellitus with hyperglycemia: Secondary | ICD-10-CM

## 2012-07-09 DIAGNOSIS — IMO0001 Reserved for inherently not codable concepts without codable children: Secondary | ICD-10-CM

## 2012-07-09 LAB — COMPREHENSIVE METABOLIC PANEL
Albumin: 4.2 g/dL (ref 3.5–5.2)
BUN: 11 mg/dL (ref 6–23)
CO2: 25 mEq/L (ref 19–32)
Calcium: 9.8 mg/dL (ref 8.4–10.5)
Chloride: 104 mEq/L (ref 96–112)
Creatinine, Ser: 0.8 mg/dL (ref 0.4–1.2)
GFR: 80.83 mL/min (ref >60.00)
Glucose, Bld: 144 mg/dL — ABNORMAL HIGH (ref 70–99)
Potassium: 3.8 mEq/L (ref 3.5–5.1)

## 2012-07-09 LAB — MICROALBUMIN / CREATININE URINE RATIO
Creatinine,U: 128.6 mg/dL
Microalb Creat Ratio: 0.9 mg/g (ref 0.0–30.0)
Microalb, Ur: 1.1 mg/dL (ref 0.0–1.9)

## 2012-07-09 LAB — HM DIABETES FOOT EXAM: HM Diabetic Foot Exam: NORMAL

## 2012-07-09 MED ORDER — CLOPIDOGREL BISULFATE 75 MG PO TABS: 75.0000 mg | ORAL_TABLET | Freq: Every day | ORAL | Status: DC

## 2012-07-09 MED ORDER — AZILSARTAN-CHLORTHALIDONE 40-12.5 MG PO TABS: 1.0000 | ORAL_TABLET | Freq: Every day | ORAL | Status: DC

## 2012-07-09 MED ORDER — ROSUVASTATIN CALCIUM 20 MG PO TABS: 20.0000 mg | ORAL_TABLET | Freq: Every day | ORAL | Status: DC

## 2012-07-09 NOTE — Assessment & Plan Note (Signed)
Untreated currently,  Needs ARV, previously on losartan,  Starting edarbyclor   6 weeks of samples given.

## 2012-07-09 NOTE — Patient Instructions (Addendum)
I have given you samples of :  crestor 20 mg one tablet daily for cholesterol  janumet   50/1000 one tablet daily for diabetes   edarbyclor 40/12.5  One tablet daily for blood pressure  We will make changes based on your bloodwork and your formulary coverage   Return in 3 months for your annual physical with PAP smear

## 2012-07-09 NOTE — Progress Notes (Signed)
Patient ID: Sharon Melton, female   DOB: 26-Jun-1960, 52 y.o.   MRN: [id number]   Patient Active Problem List  Diagnosis  . Generalized anxiety disorder  . Hypertension diastolic  . Hyperlipidemia LDL goal < 70  . Coronary artery disease  . Screening for colon cancer  . Obesity  . Diabetes mellitus type II, uncontrolled    Subjective:  CC:   Chief Complaint  Patient presents with  . Follow-up    Med Refills    HPI:   Sharon Sheperd Mullenis a 52 y.o. female who presents  For 4 month follow up on diabetes, hypertension, hyperlipidemia.  She has been untreated since since November due to lack of insurance.  Has been avoiding starches.    Some insomnia  due to finances and bankruptcy . Using alprazolam for middle of the night early wakeups but not otherwise.  Taking 2 baby aspirin bc she stopped the plavix, which has been taking for over 2 years for CAD s/p PTCA/stent.     Past Medical History  Diagnosis Date  . Myocardial infarction 02/2007    Inferior STEMI with bare metal stent to the PDA placed at [facility]  . Hypertension   . Hyperlipidemia   . Diabetes mellitus     Past Surgical History  Procedure Laterality Date  . Spine surgery      lumbar     The following portions of the patient's history were reviewed and updated as appropriate: Allergies, current medications, and problem list.    Review of Systems:   Patient denies headache, fevers, malaise, unintentional weight loss, skin rash, eye pain, sinus congestion and sinus pain, sore throat, dysphagia,  hemoptysis , cough, dyspnea, wheezing, chest pain, palpitations, orthopnea, edema, abdominal pain, nausea, melena, diarrhea, constipation, flank pain, dysuria, hematuria, urinary  Frequency, nocturia, numbness, tingling, seizures,  Focal weakness, Loss of consciousness,  Tremor, insomnia, depression, anxiety, and suicidal ideation.     History   Social History  . Marital Status: Married    Spouse Name: N/A    Number  of Children: N/A  . Years of Education: N/A   Occupational History  . Not on file.   Social History Main Topics  . Smoking status: Former Smoker    Quit date: 02/21/2007  . Smokeless tobacco: Never Used  . Alcohol Use: No  . Drug Use: Yes     Comment: occasional  . Sexually Active: Not on file   Other Topics Concern  . Not on file   Social History Narrative  . No narrative on file    Objective:  BP 148/82  Pulse 64  Temp(Src) 97.6 F (36.4 C) (Oral)  Resp 16  Wt 164 lb 8 oz (74.617 kg)  BMI 27.37 kg/m2  SpO2 96%  General appearance: alert, cooperative and appears stated age Ears: normal TM's and external ear canals both ears Throat: lips, mucosa, and tongue normal; teeth and gums normal Neck: no adenopathy, no carotid bruit, supple, symmetrical, trachea midline and thyroid not enlarged, symmetric, no tenderness/mass/nodules Back: symmetric, no curvature. ROM normal. No CVA tenderness. Lungs: clear to auscultation bilaterally Heart: regular rate and rhythm, S1, S2 normal, no murmur, click, rub or gallop Abdomen: soft, non-tender; bowel sounds normal; no masses,  no organomegaly Pulses: 2+ and symmetric Skin: Skin color, texture, turgor normal. No rashes or lesions Lymph nodes: Cervical, supraclavicular, and axillary nodes normal. Foot exam:  Nails are well trimmed,  No callouses,  Sensation intact to microfilament   Assessment and Plan:  Coronary artery disease Resume Plavix, statin , and ARB.  Crestor 20 mg and edarvychlor 40 mg samples given  To tide her over for 6 weeks,   Fasting lipids today  Diabetes mellitus type II, uncontrolled Previously controlled with metformin and levemir,  Off meds since November.  hgba1c is 6.5 .  No meds needed urine negative for protein , continue ARB with samples of edarbyclor for now with urine negative for protein   Diabetic eye exam advised,  LF 188, crestor rx samples given. Foot exam normal    Hypertension,  diastolic Untreated currently,  Needs ARB, previously on losartan,  Starting edarbyclor   6 weeks of samples given.    Updated Medication List Outpatient Encounter Prescriptions as of 07/09/2012  Medication Sig Dispense Refill  . ALPRAZolam (XANAX) 0.5 MG tablet Take 1/2 to 1 tablet by mouth daily as needed for panic symptoms.  30 tablet  5  . Azilsartan-Chlorthalidone 40-12.5 MG TABS Take 1 tablet by mouth daily.  42 tablet  0  . clopidogrel (PLAVIX) 75 MG tablet Take 1 tablet (75 mg total) by mouth daily.  30 tablet  3  . Insulin Pen Needle 32G X 4 MM MISC Use as directed once daily  100 each  1  . rosuvastatin (CRESTOR) 20 MG tablet Take 1 tablet (20 mg total) by mouth daily.  42 tablet  0  . [DISCONTINUED] amLODipine (NORVASC) 10 MG tablet TAKE 1 TABLET (10 MG TOTAL) BY MOUTH DAILY.  30 tablet  11  . [DISCONTINUED] atorvastatin (LIPITOR) 80 MG tablet Take 1 tablet (80 mg total) by mouth daily.  30 tablet  5  . [DISCONTINUED] escitalopram (LEXAPRO) 10 MG tablet TAKE 1 TABLET (10 MG TOTAL) BY MOUTH DAILY.  30 tablet  3  . [DISCONTINUED] insulin detemir (LEVEMIR) 100 UNIT/ML injection Inject 15 Units into the skin at bedtime.  10 mL  12  . [DISCONTINUED] losartan (COZAAR) 100 MG tablet Take 1 tablet (100 mg total) by mouth daily.  30 tablet  6  . [DISCONTINUED] metFORMIN (GLUCOPHAGE) 500 MG tablet Take 1 tablet (500 mg total) by mouth 2 (two) times daily with a meal.  180 tablet  3   No facility-administered encounter medications on file as of 07/09/2012.

## 2012-07-09 NOTE — Assessment & Plan Note (Addendum)
Previously controlled with metformin and levemir,  Off meds since November.  hgba1c is 6.5 .  No meds needed urine negative for protein , continue ARB with samples of edarbyclor for now with urine negative for protein   Diabetic eye exam advised,  LF 188, crestor rx samples given. Foot exam normal

## 2012-07-09 NOTE — Assessment & Plan Note (Addendum)
Resume Plavix, statin , and ARB.  Crestor 20 mg and edarvychlor 40 mg samples given  To tide her over for 6 weeks,   Fasting lipids today

## 2012-07-10 ENCOUNTER — Encounter: Payer: Self-pay | Admitting: Internal Medicine

## 2012-07-19 ENCOUNTER — Other Ambulatory Visit: Payer: Self-pay | Admitting: Internal Medicine

## 2012-07-20 NOTE — Telephone Encounter (Signed)
Ok to refill,  Authorized in epic 

## 2012-09-14 ENCOUNTER — Emergency Department: Payer: Self-pay | Admitting: Emergency Medicine

## 2012-10-01 ENCOUNTER — Other Ambulatory Visit: Payer: Self-pay | Admitting: Internal Medicine

## 2012-10-02 MED ORDER — AZILSARTAN-CHLORTHALIDONE 40-12.5 MG PO TABS: 1.0000 | ORAL_TABLET | Freq: Every day | ORAL | Status: DC

## 2012-10-02 MED ORDER — ROSUVASTATIN CALCIUM 20 MG PO TABS: 20.0000 mg | ORAL_TABLET | Freq: Every day | ORAL | Status: DC

## 2012-10-02 NOTE — Telephone Encounter (Signed)
Medication filled last ov 2/14

## 2012-10-04 ENCOUNTER — Telehealth: Payer: Self-pay | Admitting: Internal Medicine

## 2012-10-04 NOTE — Telephone Encounter (Signed)
Pt checking on her 2 rx  crestor and bp meds.  Pt stated [facility] said this was denied.  Please confirm with pt

## 2012-10-05 ENCOUNTER — Telehealth: Payer: Self-pay | Admitting: *Deleted

## 2012-10-05 ENCOUNTER — Encounter: Payer: Self-pay | Admitting: Internal Medicine

## 2012-10-05 MED ORDER — AZILSARTAN-CHLORTHALIDONE 40-12.5 MG PO TABS: 1.0000 | ORAL_TABLET | Freq: Every day | ORAL | Status: DC

## 2012-10-05 MED ORDER — ROSUVASTATIN CALCIUM 20 MG PO TABS: 20.0000 mg | ORAL_TABLET | Freq: Every day | ORAL | Status: DC

## 2012-10-05 NOTE — Telephone Encounter (Signed)
Patient medications refilled and patient notified,

## 2012-10-05 NOTE — Telephone Encounter (Signed)
Pharmacy states that pt's medications did not need to be refilled. They need a prior auth through [place]. Medications requiring PA are: Crestor & Azilsartan-Chlorthalidine Please call [employer] @ [phone].

## 2012-10-08 NOTE — Telephone Encounter (Signed)
Edarbyclor Prior authorization fax form is being faxed over now as well

## 2012-10-08 NOTE — Telephone Encounter (Signed)
Called [place] [employer] [place] [phone] for prior authorization on the Crestor, form is being faxed over now

## 2012-10-22 ENCOUNTER — Encounter: Payer: BC Managed Care – PPO | Admitting: Internal Medicine

## 2012-10-29 ENCOUNTER — Encounter: Payer: Self-pay | Admitting: *Deleted

## 2012-11-07 ENCOUNTER — Other Ambulatory Visit: Payer: Self-pay | Admitting: Internal Medicine

## 2012-11-17 MED ORDER — LOSARTAN POTASSIUM-HCTZ 100-25 MG PO TABS: 1.0000 | ORAL_TABLET | Freq: Every day | ORAL | Status: DC

## 2012-11-17 MED ORDER — ATORVASTATIN CALCIUM 40 MG PO TABS: 40.0000 mg | ORAL_TABLET | Freq: Every day | ORAL | Status: DC

## 2012-11-22 ENCOUNTER — Other Ambulatory Visit (HOSPITAL_COMMUNITY)
Admission: RE | Admit: 2012-11-22 | Discharge: 2012-11-22 | Disposition: A | Discharge status: Home / Self Care | Payer: BC Managed Care – PPO | Source: Ambulatory Visit | Attending: Internal Medicine | Admitting: Internal Medicine

## 2012-11-22 ENCOUNTER — Encounter: Payer: Self-pay | Admitting: Internal Medicine

## 2012-11-22 ENCOUNTER — Ambulatory Visit (INDEPENDENT_AMBULATORY_CARE_PROVIDER_SITE_OTHER): Payer: BC Managed Care – PPO | Admitting: Internal Medicine

## 2012-11-22 VITALS — BP 120/78 | HR 83 | Temp 98.7°F | Resp 16 | Ht 65.0 in | Wt 162.0 lb

## 2012-11-22 DIAGNOSIS — R5381 Other malaise: Secondary | ICD-10-CM

## 2012-11-22 DIAGNOSIS — Z01419 Encounter for gynecological examination (general) (routine) without abnormal findings: Secondary | ICD-10-CM | POA: Insufficient documentation

## 2012-11-22 DIAGNOSIS — E785 Hyperlipidemia, unspecified: Secondary | ICD-10-CM

## 2012-11-22 DIAGNOSIS — Z124 Encounter for screening for malignant neoplasm of cervix: Secondary | ICD-10-CM

## 2012-11-22 DIAGNOSIS — E1165 Type 2 diabetes mellitus with hyperglycemia: Secondary | ICD-10-CM

## 2012-11-22 DIAGNOSIS — Z0000 Encounter for general adult medical examination without abnormal findings: Secondary | ICD-10-CM

## 2012-11-22 DIAGNOSIS — R5383 Other fatigue: Secondary | ICD-10-CM

## 2012-11-22 DIAGNOSIS — E119 Type 2 diabetes mellitus without complications: Secondary | ICD-10-CM

## 2012-11-22 DIAGNOSIS — E559 Vitamin D deficiency, unspecified: Secondary | ICD-10-CM

## 2012-11-22 DIAGNOSIS — I2510 Atherosclerotic heart disease of native coronary artery without angina pectoris: Secondary | ICD-10-CM

## 2012-11-22 DIAGNOSIS — Z1151 Encounter for screening for human papillomavirus (HPV): Secondary | ICD-10-CM | POA: Insufficient documentation

## 2012-11-22 NOTE — Progress Notes (Signed)
Patient ID: Sharon Melton, female   DOB: February 25, 1961, 52 y.o.   MRN: [id number]    Subjective:     Sharon Melton is a 52 y.o. female and is here for a comprehensive physical exam. The patient reports no problems.  History   Social History  . Marital Status: Married    Spouse Name: N/A    Number of Children: N/A  . Years of Education: N/A   Occupational History  . Not on file.   Social History Main Topics  . Smoking status: Former Smoker    Quit date: 02/21/2007  . Smokeless tobacco: Never Used  . Alcohol Use: No  . Drug Use: Yes     Comment: occasional  . Sexually Active: Not on file   Other Topics Concern  . Not on file   Social History Narrative  . No narrative on file   Health Maintenance  Topic Date Due  . Colonoscopy  03/25/2009  . Influenza Vaccine  01/21/2013  . Mammogram  01/06/2014  . Pap Smear  11/23/2015  . Tetanus/tdap  10/24/2022    The following portions of the patient's history were reviewed and updated as appropriate: allergies, current medications, past family history, past medical history, past social history, past surgical history and problem list.  Review of Systems A comprehensive review of systems was negative.   Objective:   BP 120/78  Pulse 83  Temp(Src) 98.7 F (37.1 C) (Oral)  Resp 16  Ht 5\' 5"  (1.651 m)  Wt 162 lb (73.483 kg)  BMI 26.96 kg/m2  SpO2 98% General Appearance:    Alert, cooperative, no distress, appears stated age  Head:    Normocephalic, without obvious abnormality, atraumatic  Eyes:    PERRL, conjunctiva/corneas clear, EOM's intact, fundi    benign, both eyes  Ears:    Normal TM's and external ear canals, both ears  Nose:   Nares normal, septum midline, mucosa normal, no drainage    or sinus tenderness  Throat:   Lips, mucosa, and tongue normal; teeth and gums normal  Neck:   Supple, symmetrical, trachea midline, no adenopathy;    thyroid:  no enlargement/tenderness/nodules; no carotid   bruit or JVD   Back:     Symmetric, no curvature, ROM normal, no CVA tenderness  Lungs:     Clear to auscultation bilaterally, respirations unlabored  Chest Wall:    No tenderness or deformity   Heart:    Regular rate and rhythm, S1 and S2 normal, no murmur, rub   or gallop  Breast Exam:    No tenderness, masses, or nipple abnormality  Abdomen:     Soft, non-tender, bowel sounds active all four quadrants,    no masses, no organomegaly  Genitalia:    Pelvic: cervix normal in appearance, external genitalia normal, no adnexal masses or tenderness, no cervical motion tenderness, rectovaginal septum normal, uterus normal size, shape, and consistency and vagina normal without discharge  Extremities:   Extremities normal, atraumatic, no cyanosis or edema  Pulses:   2+ and symmetric all extremities  Skin:   Skin color, texture, turgor normal, no rashes or lesions  Lymph nodes:   Cervical, supraclavicular, and axillary nodes normal  Neurologic:   CNII-XII intact, normal strength, sensation and reflexes    throughout     Assessment:   Diabetes mellitus type II, uncontrolled Previously controlled with metformin and levemir,  Off meds since November.  Last hgba1c is 6.5 .  No meds needed.   urine  negative for protein , continue losartin and lipitor.   Diabetic eye exam advised,   Foot exam normal  .  Return for fasting labs.     Coronary artery disease Asymptomatic currently.  Recommended asa daily, continue statin and ARB.   Routine general medical examination at a health care facility Annual comprehensive exam was done including breast, pelvic and PAP smear. All screenings have been addressed .    Updated Medication List Outpatient Encounter Prescriptions as of 11/22/2012  Medication Sig Dispense Refill  . ALPRAZolam (XANAX) 0.5 MG tablet TAKE 1/2 - 1 TABLET BY MOUTH DAILY AS NEEDED FOR PANIC SYMPTOMS  30 tablet  5  . atorvastatin (LIPITOR) 40 MG tablet Take 1 tablet (40 mg total) by mouth daily.  90  tablet  3  . clopidogrel (PLAVIX) 75 MG tablet TAKE 1 TABLET BY MOUTH EVERY DAY  30 tablet  5  . losartan-hydrochlorothiazide (HYZAAR) 100-25 MG per tablet Take 1 tablet by mouth daily.  90 tablet  3  . Insulin Pen Needle 32G X 4 MM MISC Use as directed once daily  100 each  1   No facility-administered encounter medications on file as of 11/22/2012.

## 2012-11-22 NOTE — Patient Instructions (Addendum)
Return for fasting labs ASAP  I recommend taking a baby aspirin daily   You need to have a diabetic eye exam every 12 months,  Please let me known if we cna assist you with that scheduling  Return in 3 months  See the diet below for other low carb /low GI foods that will help control your diabetes without medications    All of the foods can be found at grocery stores and in bulk at [employer].  The Atkins protein bars and shakes are available in more varieties at Target, WalMart and [employer]'s Foods.     7 AM Breakfast:  Choose from the following:  Low carbohydrate Protein  Shakes (I recommend the EAS AdvantEdge "Carb Control" shakes  Or the low carb shakes by Atkins.    2.5 carbs   Arnold's "Sandwhich Thin"toasted  w/ peanut butter (no jelly: about 20 net carbs  "Bagel Thin" with cream cheese and salmon: about 20 carbs   a scrambled egg/bacon/cheese burrito made with Mission's "carb balance" whole wheat tortilla  (about 10 net carbs )   Avoid cereal and bananas, oatmeal and cream of wheat and grits. They are loaded with carbohydrates!   10 AM: high protein snack  Protein bar by Atkins (the snack size, under 200 cal, usually < 6 net carbs).    A stick of cheese:  Around 1 carb,  100 cal     Dannon Light n Fit [place] Yogurt  (80 cal, 8 carbs)  Other so called "protein bars" and Greek yogurts tend to be loaded with carbohydrates.  Remember, in food advertising, the word "energy" is synonymous for " carbohydrate."  Lunch:   A Sandwich using the bread choices listed, Can use any  Eggs,  lunchmeat, grilled meat or canned tuna), avocado, regular mayo/mustard  and cheese.  A Salad using blue cheese, ranch,  Goddess or vinagrette,  No croutons or "confetti" and no "candied nuts" but regular nuts OK.   No pretzels or chips.  Pickles and miniature sweet peppers are a good low carb alternative that provide a "crunch"  The bread is the only source of carbohydrate in a sandwich and  can be decreased by  trying some of these alternatives to traditional loaf bread  Joseph's makes a pita bread and a flat bread that are 50 cal and 4 net carbs available at BJs and WalMart.  This can be toasted to use with hummous as well  Toufayan makes a low carb flatbread that's 100 cal and 9 net carbs available at [employer] and [employer] makes 2 sizes of  Low carb whole wheat tortilla  (The large one is 210 cal and 6 net carbs) Avoid "Low fat dressings, as well as Reyne Dumas and [address] dressings They are loaded with sugar!   3 PM/ Mid day  Snack:  Consider  1 ounce of  almonds, walnuts, pistachios, pecans, peanuts,  Macadamia nuts or a nut medley.  Avoid "granola"; the dried cranberries and raisins are loaded with carbohydrates. Mixed nuts as long as there are no raisins,  cranberries or dried fruit.     6 PM  Dinner:     Meat/fowl/fish with a green salad, and either broccoli, cauliflower, green beans, spinach, brussel sprouts or  Lima beans. DO NOT BREAD THE PROTEIN!!      There is a low carb pasta by Dreamfield's that is acceptable and tastes great: only 5 digestible carbs/serving.( All grocery stores but BJs carry it )  Try Michel Angelo's chicken piccata or chicken or eggplant parm over low carb pasta.(Lowes and BJs)   Clifton Custard Sanchez's "Carnitas" (pulled pork, no sauce,  0 carbs) or his beef pot roast to make a dinner burrito (at BJ's)  Pesto over low carb pasta (bj's sells a good quality pesto in the center refrigerated section of the deli   Whole wheat pasta is still full of digestible carbs and  Not as low in glycemic index as Dreamfield's.   Brown rice is still rice,  So skip the rice and noodles if you eat [place] or [place] (or at least limit to 1/2 cup)  9 PM snack :   Breyer's "low carb" fudgsicle or  ice cream bar (Carb Smart line), or  Weight Watcher's ice cream bar , or another "no sugar added" ice cream;  a serving of fresh berries/cherries with whipped cream   Cheese or DANNON'S LlGHT  N FIT GREEK YOGURT  Avoid bananas, pineapple, grapes  and watermelon on a regular basis because they are high in sugar.  THINK OF THEM AS DESSERT  Remember that snack Substitutions should be less than 10 NET carbs per serving and meals < 20 carbs. Remember to subtract fiber grams to get the "net carbs."

## 2012-11-24 ENCOUNTER — Encounter: Payer: Self-pay | Admitting: Internal Medicine

## 2012-11-24 DIAGNOSIS — Z299 Encounter for prophylactic measures, unspecified: Secondary | ICD-10-CM | POA: Insufficient documentation

## 2012-11-24 NOTE — Assessment & Plan Note (Signed)
Annual comprehensive exam was done including breast, pelvic and PAP smear. All screenings have been addressed .  

## 2012-11-24 NOTE — Assessment & Plan Note (Signed)
Asymptomatic currently.  Recommended asa daily, continue statin and ARB.

## 2012-11-24 NOTE — Assessment & Plan Note (Addendum)
Previously controlled with metformin and levemir,  Off meds since November.  Last hgba1c is 6.5 .  No meds needed.   urine negative for protein , continue losartin and lipitor.   Diabetic eye exam advised,   Foot exam normal  .  Return for fasting labs.

## 2012-11-27 ENCOUNTER — Encounter: Payer: Self-pay | Admitting: *Deleted

## 2012-12-01 LAB — HM PAP SMEAR: HM Pap smear: NORMAL

## 2012-12-02 ENCOUNTER — Other Ambulatory Visit: Payer: Self-pay | Admitting: Internal Medicine

## 2012-12-07 ENCOUNTER — Telehealth: Payer: Self-pay | Admitting: Internal Medicine

## 2012-12-07 ENCOUNTER — Other Ambulatory Visit (INDEPENDENT_AMBULATORY_CARE_PROVIDER_SITE_OTHER): Payer: BC Managed Care – PPO

## 2012-12-07 DIAGNOSIS — E119 Type 2 diabetes mellitus without complications: Secondary | ICD-10-CM

## 2012-12-07 DIAGNOSIS — E559 Vitamin D deficiency, unspecified: Secondary | ICD-10-CM

## 2012-12-07 DIAGNOSIS — IMO0001 Reserved for inherently not codable concepts without codable children: Secondary | ICD-10-CM

## 2012-12-07 DIAGNOSIS — E785 Hyperlipidemia, unspecified: Secondary | ICD-10-CM

## 2012-12-07 DIAGNOSIS — R5381 Other malaise: Secondary | ICD-10-CM

## 2012-12-07 LAB — COMPREHENSIVE METABOLIC PANEL
AST: 24 U/L (ref 0–37)
Albumin: 4.5 g/dL (ref 3.5–5.2)
BUN: 20 mg/dL (ref 6–23)
CO2: 28 mEq/L (ref 19–32)
Calcium: 10.3 mg/dL (ref 8.4–10.5)
Chloride: 98 mEq/L (ref 96–112)
GFR: 74.16 mL/min (ref >60.00)
Potassium: 3.8 mEq/L (ref 3.5–5.1)

## 2012-12-07 LAB — MICROALBUMIN / CREATININE URINE RATIO
Creatinine,U: 146.7 mg/dL
Microalb Creat Ratio: 0.6 mg/g (ref 0.0–30.0)
Microalb, Ur: 0.9 mg/dL (ref 0.0–1.9)

## 2012-12-07 LAB — LIPID PANEL
Cholesterol: 144 mg/dL (ref 0–200)
LDL Cholesterol: 86 mg/dL (ref 0–99)
Total CHOL/HDL Ratio: 4

## 2012-12-07 MED ORDER — ESCITALOPRAM OXALATE 10 MG PO TABS: 10.0000 mg | ORAL_TABLET | Freq: Every day | ORAL | Status: DC

## 2012-12-07 NOTE — Telephone Encounter (Signed)
Pt says at her last visit you all had discussed a medication for hot flashes and depression and a medication was going to be called in but never was ??? [facility] uses [facility] in [place].

## 2012-12-07 NOTE — Telephone Encounter (Signed)
Nothing note about medication for hot flashes on AVS or notes and patient has no medication listed for Hot flashes please advise.

## 2012-12-07 NOTE — Telephone Encounter (Signed)
Generic lexapro,  Sent to pharmacy.  Start with 1/2 tablet dailr for one week, then increase to whole tablet

## 2012-12-07 NOTE — Telephone Encounter (Signed)
Left message, advising pt and to call back with any questions or concerns.

## 2012-12-09 ENCOUNTER — Encounter: Payer: Self-pay | Admitting: Internal Medicine

## 2012-12-10 ENCOUNTER — Other Ambulatory Visit: Payer: Self-pay | Admitting: *Deleted

## 2012-12-10 ENCOUNTER — Ambulatory Visit (INDEPENDENT_AMBULATORY_CARE_PROVIDER_SITE_OTHER): Payer: BC Managed Care – PPO | Admitting: *Deleted

## 2012-12-10 DIAGNOSIS — Z1211 Encounter for screening for malignant neoplasm of colon: Secondary | ICD-10-CM

## 2012-12-11 ENCOUNTER — Encounter: Payer: Self-pay | Admitting: *Deleted

## 2012-12-11 LAB — FECAL OCCULT BLOOD, IMMUNOCHEMICAL: Fecal Occult Bld: NEGATIVE

## 2012-12-12 ENCOUNTER — Encounter: Payer: Self-pay | Admitting: Internal Medicine

## 2012-12-12 LAB — FECAL OCCULT BLOOD, GUAIAC: Fecal Occult Blood: NEGATIVE

## 2012-12-14 NOTE — Assessment & Plan Note (Signed)
Fecal occult blood test was normal.

## 2013-01-03 ENCOUNTER — Other Ambulatory Visit: Payer: Self-pay | Admitting: Internal Medicine

## 2013-01-04 LAB — HM DIABETES EYE EXAM

## 2013-03-01 ENCOUNTER — Other Ambulatory Visit: Payer: Self-pay | Admitting: Internal Medicine

## 2013-03-05 ENCOUNTER — Encounter: Payer: Self-pay | Admitting: *Deleted

## 2013-03-06 ENCOUNTER — Ambulatory Visit: Payer: BC Managed Care – PPO | Admitting: Internal Medicine

## 2013-03-26 ENCOUNTER — Encounter: Payer: Self-pay | Admitting: *Deleted

## 2013-03-27 ENCOUNTER — Ambulatory Visit: Payer: Self-pay | Admitting: Internal Medicine

## 2013-03-28 ENCOUNTER — Other Ambulatory Visit: Payer: Self-pay

## 2013-04-28 ENCOUNTER — Other Ambulatory Visit: Payer: Self-pay | Admitting: Internal Medicine

## 2013-05-27 ENCOUNTER — Other Ambulatory Visit: Payer: Self-pay | Admitting: *Deleted

## 2013-05-27 MED ORDER — ESCITALOPRAM OXALATE 10 MG PO TABS: ORAL_TABLET | ORAL | Status: DC

## 2013-06-21 ENCOUNTER — Other Ambulatory Visit: Payer: Self-pay | Admitting: Internal Medicine

## 2013-06-22 NOTE — Telephone Encounter (Signed)
Okay to refill? Pt no showed last 2 appts. Last seen in July

## 2013-06-24 ENCOUNTER — Other Ambulatory Visit: Payer: Self-pay | Admitting: Internal Medicine

## 2013-06-25 ENCOUNTER — Other Ambulatory Visit: Payer: Self-pay | Admitting: Internal Medicine

## 2013-08-20 ENCOUNTER — Other Ambulatory Visit: Payer: Self-pay | Admitting: Internal Medicine

## 2013-08-20 DIAGNOSIS — E785 Hyperlipidemia, unspecified: Secondary | ICD-10-CM

## 2013-08-20 DIAGNOSIS — E1165 Type 2 diabetes mellitus with hyperglycemia: Secondary | ICD-10-CM

## 2013-08-20 DIAGNOSIS — IMO0002 Reserved for concepts with insufficient information to code with codable children: Secondary | ICD-10-CM

## 2013-08-22 NOTE — Telephone Encounter (Signed)
Last visit 11/22/12, pt has DM, sent mychart message on need for appointment. Refill?

## 2013-08-22 NOTE — Telephone Encounter (Signed)
Mailed unread message to pt  

## 2013-08-25 NOTE — Telephone Encounter (Signed)
Refill one 30 days only.  Has not been seen in over 6 months so needs fast labs  prior to office visit.

## 2013-08-26 ENCOUNTER — Other Ambulatory Visit: Payer: Self-pay | Admitting: *Deleted

## 2013-08-26 NOTE — Telephone Encounter (Signed)
Okay to refill? Last seen in July 2014. Has an appot on 09/04/13 that was scheduled via mychart to refill medications

## 2013-08-27 MED ORDER — ESCITALOPRAM OXALATE 10 MG PO TABS: ORAL_TABLET | ORAL | Status: DC

## 2013-09-04 ENCOUNTER — Encounter: Payer: Self-pay | Admitting: Internal Medicine

## 2013-09-04 ENCOUNTER — Ambulatory Visit (INDEPENDENT_AMBULATORY_CARE_PROVIDER_SITE_OTHER): Payer: BC Managed Care – PPO | Admitting: Internal Medicine

## 2013-09-04 VITALS — BP 128/78 | HR 67 | Temp 98.4°F | Resp 16 | Wt 173.5 lb

## 2013-09-04 DIAGNOSIS — E785 Hyperlipidemia, unspecified: Secondary | ICD-10-CM

## 2013-09-04 DIAGNOSIS — I10 Essential (primary) hypertension: Secondary | ICD-10-CM

## 2013-09-04 DIAGNOSIS — E1165 Type 2 diabetes mellitus with hyperglycemia: Secondary | ICD-10-CM

## 2013-09-04 DIAGNOSIS — E669 Obesity, unspecified: Secondary | ICD-10-CM

## 2013-09-04 DIAGNOSIS — IMO0001 Reserved for inherently not codable concepts without codable children: Secondary | ICD-10-CM

## 2013-09-04 DIAGNOSIS — Z0000 Encounter for general adult medical examination without abnormal findings: Secondary | ICD-10-CM

## 2013-09-04 DIAGNOSIS — IMO0002 Reserved for concepts with insufficient information to code with codable children: Secondary | ICD-10-CM

## 2013-09-04 DIAGNOSIS — I70219 Atherosclerosis of native arteries of extremities with intermittent claudication, unspecified extremity: Secondary | ICD-10-CM

## 2013-09-04 LAB — LIPID PANEL
CHOL/HDL RATIO: 4
Cholesterol: 160 mg/dL (ref 0–200)
HDL: 36.2 mg/dL — ABNORMAL LOW (ref >39.00)
LDL CALC: 87 mg/dL (ref 0–99)
Triglycerides: 182 mg/dL — ABNORMAL HIGH (ref 0.0–149.0)
VLDL: 36.4 mg/dL (ref 0.0–40.0)

## 2013-09-04 LAB — COMPREHENSIVE METABOLIC PANEL
ALK PHOS: 60 U/L (ref 39–117)
ALT: 23 U/L (ref 0–35)
AST: 31 U/L (ref 0–37)
Albumin: 4.6 g/dL (ref 3.5–5.2)
BUN: 17 mg/dL (ref 6–23)
CHLORIDE: 96 meq/L (ref 96–112)
CO2: 29 mEq/L (ref 19–32)
Calcium: 10.7 mg/dL — ABNORMAL HIGH (ref 8.4–10.5)
Creatinine, Ser: 0.8 mg/dL (ref 0.4–1.2)
GFR: 82.89 mL/min (ref >60.00)
Glucose, Bld: 106 mg/dL — ABNORMAL HIGH (ref 70–99)
Potassium: 3.7 mEq/L (ref 3.5–5.1)
Sodium: 135 mEq/L (ref 135–145)
Total Bilirubin: 1 mg/dL (ref 0.3–1.2)
Total Protein: 7.3 g/dL (ref 6.0–8.3)

## 2013-09-04 LAB — LDL CHOLESTEROL, DIRECT: Direct LDL: 98.3 mg/dL

## 2013-09-04 LAB — MICROALBUMIN / CREATININE URINE RATIO
CREATININE, U: 36 mg/dL
Microalb Creat Ratio: 0.3 mg/g (ref 0.0–30.0)
Microalb, Ur: 0.1 mg/dL (ref 0.0–1.9)

## 2013-09-04 LAB — HEMOGLOBIN A1C: Hgb A1c MFr Bld: 7.8 % — ABNORMAL HIGH (ref 4.6–6.5)

## 2013-09-04 LAB — HM DIABETES FOOT EXAM: HM Diabetic Foot Exam: NORMAL

## 2013-09-04 NOTE — Progress Notes (Signed)
Patient ID: Sharon Melton, female   DOB: 12/04/1960, 53 y.o.   MRN: [id number]  Patient Active Problem List   Diagnosis Date Noted  . Atherosclerosis of native arteries of the extremities with intermittent claudication 09/04/2013  . Routine general medical examination at a health care facility 11/24/2012  . Screening for colon cancer 12/01/2011  . Obesity 12/01/2011  . Diabetes mellitus type II, uncontrolled 12/01/2011  . Generalized anxiety disorder 08/01/2011  . Essential hypertension 08/01/2011  . Hyperlipidemia LDL goal < 70 08/01/2011  . Coronary artery disease 08/01/2011    Subjective:  CC:   Chief Complaint  Patient presents with  . Follow-up    medication management,  medication refills    HPI:   Sharon Melton is a [age] y.o. female who presents for  follow up on multiple medical issues including DM, hyperlipidemia and hypertension,  He was last seen in July.  She and husband are going through bankruptcy.  She is depressed, anxious and having insomnia.  Using xanax to help her sleep  Taking  Metformin daily but does not check blood sugars .  No neuropathic symptoms. .  Has been developing  Bilateral thigh and hip cramping when walking .     Past Medical History  Diagnosis Date  . Myocardial infarction 02/2007    Inferior STEMI with [facility]  . Hypertension   . Hyperlipidemia   . Diabetes mellitus     Past Surgical History  Procedure Laterality Date  . Spine surgery      lumbar       The following portions of the patient's history were reviewed and updated as appropriate: Allergies, current medications, and problem list.    Review of Systems:   Patient denies headache, fevers, malaise, unintentional weight loss, skin rash, eye pain, sinus congestion and sinus pain, sore throat, dysphagia,  hemoptysis , cough, dyspnea, wheezing, chest pain, palpitations, orthopnea, edema, abdominal pain, nausea, melena, diarrhea,  constipation, flank pain, dysuria, hematuria, urinary  Frequency, nocturia, numbness, tingling, seizures,  Focal weakness, Loss of consciousness,  Tremor, insomnia, depression, anxiety, and suicidal ideation.     History   Social History  . Marital Status: Married    Spouse Name: N/A    Number of Children: N/A  . Years of Education: N/A   Occupational History  . Not on file.   Social History Main Topics  . Smoking status: Former Smoker    Quit date: 02/21/2007  . Smokeless tobacco: Never Used  . Alcohol Use: No  . Drug Use: Yes     Comment: occasional  . Sexual Activity: Not on file   Other Topics Concern  . Not on file   Social History Narrative  . No narrative on file    Objective:  Filed Vitals:   09/04/13 1420  BP: 128/78  Pulse: 67  Temp: 98.4 F (36.9 C)  Resp: 16     General appearance: alert, cooperative and appears stated age Ears: normal TM's and external ear canals both ears Throat: lips, mucosa, and tongue normal; teeth and gums normal Neck: no adenopathy, no carotid bruit, supple, symmetrical, trachea midline and thyroid not enlarged, symmetric, no tenderness/mass/nodules Back: symmetric, no curvature. ROM normal. No CVA tenderness. Lungs: clear to auscultation bilaterally Heart: regular rate and rhythm, S1, S2 normal, no murmur, click, rub or gallop Abdomen: soft, non-tender; bowel sounds normal; no masses,  no organomegaly Pulses: 2+ and symmetric Skin: Skin color, texture, turgor  normal. No rashes or lesions Lymph nodes: Cervical, supraclavicular, and axillary nodes normal.  Assessment and Plan:  Atherosclerosis of native arteries of the extremities with intermittent claudication Exertional claudication symptoms are preventing her from exercising. Referral to [facility] for arterial ultrasound.   Diabetes mellitus type II, uncontrolled Previously controlled with metformin and levemir,  Now on metformin and low GI diet..No proteinuria .   Diabetic  eye exam advised,. Foot exam normal .  Lab Results  Component Value Date   HGBA1C 7.8* 09/04/2013     Lab Results  Component Value Date   ALT 23 09/04/2013   AST 31 09/04/2013   ALKPHOS 60 09/04/2013   BILITOT 1.0 09/04/2013   Lab Results  Component Value Date   MICROALBUR 0.1 09/04/2013   Lab Results  Component Value Date   CHOL 160 09/04/2013   HDL 36.20* 09/04/2013   LDLCALC 87 09/04/2013   LDLDIRECT 98.3 09/04/2013   TRIG 182.0* 09/04/2013   CHOLHDL 4 09/04/2013      Hyperlipidemia LDL goal < 70 LDL and triglycerides are at goal on current medications. He has no side effects and liver enzymes are normal. No changes today   Essential hypertension Well controlled on current regimen. Renal function stable, no changes today.  Lab Results  Component Value Date   CREATININE 0.8 09/04/2013   Lab Results  Component Value Date   NA 135 09/04/2013   K 3.7 09/04/2013   CL 96 09/04/2013   CO2 29 09/04/2013     Obesity BMI has been reduced to 28 with low glycemic index diet. I have congratulated her in reduction of   BMI and encouraged  Continued weight loss with goal of 10% of body weigh over the next 6 months using a low glycemic index diet and regular exercise a minimum of 5 days per week.     Updated Medication List Outpatient Encounter Prescriptions as of 09/04/2013  Medication Sig  . ALPRAZolam (XANAX) 0.5 MG tablet TAKE 1/2-1 TABLET BY MOUTH AS NEEDED FOR PANIC ATTACKS  . atorvastatin (LIPITOR) 40 MG tablet Take 1 tablet (40 mg total) by mouth daily.  . clopidogrel (PLAVIX) 75 MG tablet TAKE 1 TABLET BY MOUTH EVERY DAY  . escitalopram (LEXAPRO) 10 MG tablet TAKE 1 TABLET (10 MG TOTAL) BY MOUTH DAILY.  Marland Kitchen. losartan-hydrochlorothiazide (HYZAAR) 100-25 MG per tablet Take 1 tablet by mouth daily.  . metFORMIN (GLUCOPHAGE) 500 MG tablet TAKE 1 TABLET BY MOUTH 2 TIMES DAILY WITH A MEAL.  Marland Kitchen. Insulin Pen Needle 32G X 4 MM MISC Use as directed once daily

## 2013-09-04 NOTE — Progress Notes (Signed)
Pre-visit discussion using our clinic review tool. No additional management support is needed unless otherwise documented below in the visit note.  

## 2013-09-04 NOTE — Patient Instructions (Signed)
Your thigh pain may be a circulation problem, so I am sending you for a vascular evaluation  Amber will call you with the appt  We will call you with the results of you labs today  Please return in 3 months

## 2013-09-05 ENCOUNTER — Encounter: Payer: Self-pay | Admitting: Internal Medicine

## 2013-09-05 ENCOUNTER — Telehealth: Payer: Self-pay | Admitting: Internal Medicine

## 2013-09-05 NOTE — Telephone Encounter (Signed)
Relevant patient education assigned to patient using Emmi. ° °

## 2013-09-07 NOTE — Assessment & Plan Note (Signed)
Well controlled on current regimen. Renal function stable, no changes today.  Lab Results  Component Value Date   CREATININE 0.8 09/04/2013   Lab Results  Component Value Date   NA 135 09/04/2013   K 3.7 09/04/2013   CL 96 09/04/2013   CO2 29 09/04/2013

## 2013-09-07 NOTE — Assessment & Plan Note (Signed)
Previously controlled with metformin and levemir,  Now on metformin and low GI diet..No proteinuria .   Diabetic eye exam advised,. Foot exam normal .  Lab Results  Component Value Date   HGBA1C 7.8* 09/04/2013     Lab Results  Component Value Date   ALT 23 09/04/2013   AST 31 09/04/2013   ALKPHOS 60 09/04/2013   BILITOT 1.0 09/04/2013   Lab Results  Component Value Date   MICROALBUR 0.1 09/04/2013   Lab Results  Component Value Date   CHOL 160 09/04/2013   HDL 36.20* 09/04/2013   LDLCALC 87 09/04/2013   LDLDIRECT 98.3 09/04/2013   TRIG 182.0* 09/04/2013   CHOLHDL 4 09/04/2013

## 2013-09-07 NOTE — Assessment & Plan Note (Signed)
BMI has been reduced to 28 with low glycemic index diet. I have congratulated her in reduction of   BMI and encouraged  Continued weight loss with goal of 10% of body weigh over the next 6 months using a low glycemic index diet and regular exercise a minimum of 5 days per week.

## 2013-09-07 NOTE — Assessment & Plan Note (Signed)
Exertional claudication symptoms are preventing her from exercising. Referral to [facility] for arterial ultrasound.

## 2013-09-07 NOTE — Assessment & Plan Note (Signed)
LDL and triglycerides are at goal on current medications. He has no side effects and liver enzymes are normal. No changes today  

## 2013-09-11 ENCOUNTER — Encounter: Payer: Self-pay | Admitting: Emergency Medicine

## 2013-09-17 ENCOUNTER — Other Ambulatory Visit: Payer: Self-pay | Admitting: *Deleted

## 2013-09-17 MED ORDER — CLOPIDOGREL BISULFATE 75 MG PO TABS: ORAL_TABLET | ORAL | Status: DC

## 2013-09-18 ENCOUNTER — Telehealth: Payer: Self-pay

## 2013-09-18 NOTE — Telephone Encounter (Signed)
Relevant patient education assigned to patient using Emmi. ° °

## 2013-09-29 ENCOUNTER — Other Ambulatory Visit: Payer: Self-pay | Admitting: Internal Medicine

## 2013-10-10 ENCOUNTER — Other Ambulatory Visit: Payer: Self-pay | Admitting: Internal Medicine

## 2013-11-15 ENCOUNTER — Other Ambulatory Visit: Payer: Self-pay | Admitting: Internal Medicine

## 2013-11-27 ENCOUNTER — Ambulatory Visit (INDEPENDENT_AMBULATORY_CARE_PROVIDER_SITE_OTHER): Payer: BC Managed Care – PPO | Admitting: Internal Medicine

## 2013-11-27 DIAGNOSIS — Z0000 Encounter for general adult medical examination without abnormal findings: Secondary | ICD-10-CM

## 2013-11-30 ENCOUNTER — Other Ambulatory Visit: Payer: Self-pay | Admitting: Internal Medicine

## 2013-11-30 DIAGNOSIS — E119 Type 2 diabetes mellitus without complications: Secondary | ICD-10-CM

## 2013-11-30 NOTE — Progress Notes (Signed)
Patient ID: Sharon Melton, female   DOB: 01/12/1961, 53 y.o.   MRN: [id number]  Patient no showed for a 30 minute appt (annual exam)

## 2013-11-30 NOTE — Assessment & Plan Note (Signed)
Patient no showed

## 2013-12-02 NOTE — Telephone Encounter (Signed)
Ok refill? Last in 11/27/13

## 2013-12-04 ENCOUNTER — Other Ambulatory Visit: Payer: Self-pay | Admitting: *Deleted

## 2013-12-04 NOTE — Telephone Encounter (Signed)
Last visit 11/27/13, ok refill?

## 2013-12-05 ENCOUNTER — Encounter: Payer: Self-pay | Admitting: Internal Medicine

## 2013-12-06 NOTE — Telephone Encounter (Signed)
AND she needs fasting  labs prior to next diabetes visit

## 2013-12-06 NOTE — Telephone Encounter (Signed)
Pt has appt sch 01/01/14

## 2013-12-06 NOTE — Telephone Encounter (Signed)
Correction,  She no showed for her annual exam  In July.  And has diabetes .  i will not refill the controlled substance on patients who no show  I will refill the diabetes meds but she needs to be seen in the next 60 days

## 2013-12-06 NOTE — Telephone Encounter (Signed)
Yes, the reason I did not refill the alprazolam is bc it is a controlled substance, and you No showed for your recent follow up appt. The other medicatiosn can be refilled  But per office policy I will not refill controlled substances if you don't keep your appts.   Regards,  Dr. Darrick Huntsmanullo

## 2014-01-01 ENCOUNTER — Ambulatory Visit (INDEPENDENT_AMBULATORY_CARE_PROVIDER_SITE_OTHER): Payer: BC Managed Care – PPO | Admitting: Internal Medicine

## 2014-01-01 ENCOUNTER — Encounter: Payer: Self-pay | Admitting: Internal Medicine

## 2014-01-01 VITALS — BP 134/78 | HR 81 | Temp 98.9°F | Resp 16 | Ht 66.5 in | Wt 171.5 lb

## 2014-01-01 DIAGNOSIS — IMO0001 Reserved for inherently not codable concepts without codable children: Secondary | ICD-10-CM

## 2014-01-01 DIAGNOSIS — E1165 Type 2 diabetes mellitus with hyperglycemia: Secondary | ICD-10-CM

## 2014-01-01 DIAGNOSIS — Z0000 Encounter for general adult medical examination without abnormal findings: Secondary | ICD-10-CM

## 2014-01-01 DIAGNOSIS — Z1239 Encounter for other screening for malignant neoplasm of breast: Secondary | ICD-10-CM

## 2014-01-01 DIAGNOSIS — E119 Type 2 diabetes mellitus without complications: Secondary | ICD-10-CM

## 2014-01-01 DIAGNOSIS — Z87891 Personal history of nicotine dependence: Secondary | ICD-10-CM

## 2014-01-01 DIAGNOSIS — IMO0002 Reserved for concepts with insufficient information to code with codable children: Secondary | ICD-10-CM

## 2014-01-01 MED ORDER — ALPRAZOLAM 0.5 MG PO TABS: ORAL_TABLET | ORAL | Status: DC

## 2014-01-01 NOTE — Patient Instructions (Addendum)
You had your annual  wellness exam today.  We will repeat your PAP smear in 2017, sooner if needed   We will schedule your mammogram at [facility].  We will contact you with the bloodwork results  .Health Maintenance Adopting a healthy lifestyle and getting preventive care can go a long way to promote health and wellness. Talk with your health care provider about what schedule of regular examinations is right for you. This is a good chance for you to check in with your provider about disease prevention and staying healthy. In between checkups, there are plenty of things you can do on your own. Experts have done a lot of research about which lifestyle changes and preventive measures are most likely to keep you healthy. Ask your health care provider for more information. WEIGHT AND DIET  Eat a healthy diet  Be sure to include plenty of vegetables, fruits, low-fat dairy products, and lean protein.  Do not eat a lot of foods high in solid fats, added sugars, or salt.  Get regular exercise. This is one of the most important things you can do for your health.  Most adults should exercise for at least 150 minutes each week. The exercise should increase your heart rate and make you sweat (moderate-intensity exercise).  Most adults should also do strengthening exercises at least twice a week. This is in addition to the moderate-intensity exercise.  Maintain a healthy weight  Body mass index (BMI) is a measurement that can be used to identify possible weight problems. It estimates body fat based on height and weight. Your health care provider can help determine your BMI and help you achieve or maintain a healthy weight.  For females 72 years of age and older:   A BMI below 18.5 is considered underweight.  A BMI of 18.5 to 24.9 is normal.  A BMI of 25 to 29.9 is considered overweight.  A BMI of 30 and above is considered obese.  Watch levels of cholesterol and blood lipids  You should start  having your blood tested for lipids and cholesterol at 55 years of age, then have this test every 5 years.  You may need to have your cholesterol levels checked more often if:  Your lipid or cholesterol levels are high.  You are older than 55 years of age.  You are at high risk for heart disease.  CANCER SCREENING   Lung Cancer  Lung cancer screening is recommended for adults 60-66 years old who are at high risk for lung cancer because of a history of smoking.  A yearly low-dose CT scan of the lungs is recommended for people who:  Currently smoke.  Have quit within the past 15 years.  Have at least a 30-pack-year history of smoking. A pack year is smoking an average of one pack of cigarettes a day for 1 year.  Yearly screening should continue until it has been 15 years since you quit.  Yearly screening should stop if you develop a health problem that would prevent you from having lung cancer treatment.  Breast Cancer  Practice breast self-awareness. This means understanding how your breasts normally appear and feel.  It also means doing regular breast self-exams. Let your health care provider know about any changes, no matter how small.  If you are in your 20s or 30s, you should have a clinical breast exam (CBE) by a health care provider every 1-3 years as part of a regular health exam.  If you are 40  or older, have a CBE every year. Also consider having a breast X-ray (mammogram) every year.  If you have a family history of breast cancer, talk to your health care provider about genetic screening.  If you are at high risk for breast cancer, talk to your health care provider about having an MRI and a mammogram every year.  Breast cancer gene (BRCA) assessment is recommended for women who have family members with BRCA-related cancers. BRCA-related cancers include:  Breast.  Ovarian.  Tubal.  Peritoneal cancers.  Results of the assessment will determine the need for  genetic counseling and BRCA1 and BRCA2 testing. Cervical Cancer Routine pelvic examinations to screen for cervical cancer are no longer recommended for nonpregnant women who are considered low risk for cancer of the pelvic organs (ovaries, uterus, and vagina) and who do not have symptoms. A pelvic examination may be necessary if you have symptoms including those associated with pelvic infections. Ask your health care provider if a screening pelvic exam is right for you.   The Pap test is the screening test for cervical cancer for women who are considered at risk.  If you had a hysterectomy for a problem that was not cancer or a condition that could lead to cancer, then you no longer need Pap tests.  If you are older than 65 years, and you have had normal Pap tests for the past 10 years, you no longer need to have Pap tests.  If you have had past treatment for cervical cancer or a condition that could lead to cancer, you need Pap tests and screening for cancer for at least 20 years after your treatment.  If you no longer get a Pap test, assess your risk factors if they change (such as having a new sexual partner). This can affect whether you should start being screened again.  Some women have medical problems that increase their chance of getting cervical cancer. If this is the case for you, your health care provider may recommend more frequent screening and Pap tests.  The human papillomavirus (HPV) test is another test that may be used for cervical cancer screening. The HPV test looks for the virus that can cause cell changes in the cervix. The cells collected during the Pap test can be tested for HPV.  The HPV test can be used to screen women 65 years of age and older. Getting tested for HPV can extend the interval between normal Pap tests from three to five years.  An HPV test also should be used to screen women of any age who have unclear Pap test results.  After 55 years of age, women  should have HPV testing as often as Pap tests.  Colorectal Cancer  This type of cancer can be detected and often prevented.  Routine colorectal cancer screening usually begins at 55 years of age and continues through 55 years of age.  Your health care provider may recommend screening at an earlier age if you have risk factors for colon cancer.  Your health care provider may also recommend using home test kits to check for hidden blood in the stool.  A small camera at the end of a tube can be used to examine your colon directly (sigmoidoscopy or colonoscopy). This is done to check for the earliest forms of colorectal cancer.  Routine screening usually begins at age 11.  Direct examination of the colon should be repeated every 5-10 years through 55 years of age. However, you may need to  be screened more often if early forms of precancerous polyps or small growths are found. Skin Cancer  Check your skin from head to toe regularly.  Tell your health care provider about any new moles or changes in moles, especially if there is a change in a mole's shape or color.  Also tell your health care provider if you have a mole that is larger than the size of a pencil eraser.  Always use sunscreen. Apply sunscreen liberally and repeatedly throughout the day.  Protect yourself by wearing long sleeves, pants, a wide-brimmed hat, and sunglasses whenever you are outside. HEART DISEASE, DIABETES, AND HIGH BLOOD PRESSURE   Have your blood pressure checked at least every 1-2 years. High blood pressure causes heart disease and increases the risk of stroke.  If you are between 59 years and 45 years old, ask your health care provider if you should take aspirin to prevent strokes.  Have regular diabetes screenings. This involves taking a blood sample to check your fasting blood sugar level.  If you are at a normal weight and have a low risk for diabetes, have this test once every three years after 55 years  of age.  If you are overweight and have a high risk for diabetes, consider being tested at a younger age or more often. PREVENTING INFECTION  Hepatitis B  If you have a higher risk for hepatitis B, you should be screened for this virus. You are considered at high risk for hepatitis B if:  You were born in a country where hepatitis B is common. Ask your health care provider which countries are considered high risk.  Your parents were born in a high-risk country, and you have not been immunized against hepatitis B (hepatitis B vaccine).  You have HIV or AIDS.  You use needles to inject street drugs.  You live with someone who has hepatitis B.  You have had sex with someone who has hepatitis B.  You get hemodialysis treatment.  You take certain medicines for conditions, including cancer, organ transplantation, and autoimmune conditions. Hepatitis C  Blood testing is recommended for:  Everyone born from [birth year] through 1967.  Anyone with known risk factors for hepatitis C. Sexually transmitted infections (STIs)  You should be screened for sexually transmitted infections (STIs) including gonorrhea and chlamydia if:  You are sexually active and are younger than 55 years of age.  You are older than 55 years of age and your health care provider tells you that you are at risk for this type of infection.  Your sexual activity has changed since you were last screened and you are at an increased risk for chlamydia or gonorrhea. Ask your health care provider if you are at risk.  If you do not have HIV, but are at risk, it may be recommended that you take a prescription medicine daily to prevent HIV infection. This is called pre-exposure prophylaxis (PrEP). You are considered at risk if:  You are sexually active and do not regularly use condoms or know the HIV status of your partner(s).  You take drugs by injection.  You are sexually active with a partner who has HIV. Talk with your  health care provider about whether you are at high risk of being infected with HIV. If you choose to begin PrEP, you should first be tested for HIV. You should then be tested every 3 months for as long as you are taking PrEP.  PREGNANCY   If you are premenopausal and  you may become pregnant, ask your health care provider about preconception counseling.  If you may become pregnant, take 400 to 800 micrograms (mcg) of folic acid every day.  If you want to prevent pregnancy, talk to your health care provider about birth control (contraception). OSTEOPOROSIS AND MENOPAUSE   Osteoporosis is a disease in which the bones lose minerals and strength with aging. This can result in serious bone fractures. Your risk for osteoporosis can be identified using a bone density scan.  If you are 65 years of age or older, or if you are at risk for osteoporosis and fractures, ask your health care provider if you should be screened.  Ask your health care provider whether you should take a calcium or vitamin D supplement to lower your risk for osteoporosis.  Menopause may have certain physical symptoms and risks.  Hormone replacement therapy may reduce some of these symptoms and risks. Talk to your health care provider about whether hormone replacement therapy is right for you.  HOME CARE INSTRUCTIONS   Schedule regular health, dental, and eye exams.  Stay current with your immunizations.   Do not use any tobacco products including cigarettes, chewing tobacco, or electronic cigarettes.  If you are pregnant, do not drink alcohol.  If you are breastfeeding, limit how much and how often you drink alcohol.  Limit alcohol intake to no more than 1 drink per day for nonpregnant women. One drink equals 12 ounces of beer, 5 ounces of wine, or 1 ounces of hard liquor.  Do not use street drugs.  Do not share needles.  Ask your health care provider for help if you need support or information about quitting  drugs.  Tell your health care provider if you often feel depressed.  Tell your health care provider if you have ever been abused or do not feel safe at home. Document Released: 11/22/2010 Document Revised: 09/23/2013 Document Reviewed: 04/10/2013 [employer] Patient Information 2015 [employer]. This information is not intended to replace advice given to you by your health care provider. Make sure you discuss any questions you have with your health care provider.  

## 2014-01-01 NOTE — Progress Notes (Signed)
Pre-visit discussion using our clinic review tool. No additional management support is needed unless otherwise documented below in the visit note.  

## 2014-01-03 ENCOUNTER — Other Ambulatory Visit (INDEPENDENT_AMBULATORY_CARE_PROVIDER_SITE_OTHER): Payer: BC Managed Care – PPO

## 2014-01-03 ENCOUNTER — Encounter: Payer: Self-pay | Admitting: Internal Medicine

## 2014-01-03 DIAGNOSIS — IMO0002 Reserved for concepts with insufficient information to code with codable children: Secondary | ICD-10-CM

## 2014-01-03 DIAGNOSIS — IMO0001 Reserved for inherently not codable concepts without codable children: Secondary | ICD-10-CM

## 2014-01-03 DIAGNOSIS — E785 Hyperlipidemia, unspecified: Secondary | ICD-10-CM

## 2014-01-03 DIAGNOSIS — E1165 Type 2 diabetes mellitus with hyperglycemia: Secondary | ICD-10-CM

## 2014-01-03 LAB — LIPID PANEL
Cholesterol: 164 mg/dL (ref 0–200)
HDL: 32.3 mg/dL — ABNORMAL LOW (ref >39.00)
NonHDL: 131.7
Total CHOL/HDL Ratio: 5
Triglycerides: 320 mg/dL — ABNORMAL HIGH (ref 0.0–149.0)
VLDL: 64 mg/dL — AB (ref 0.0–40.0)

## 2014-01-03 LAB — COMPREHENSIVE METABOLIC PANEL
ALK PHOS: 75 U/L (ref 39–117)
ALT: 18 U/L (ref 0–35)
AST: 28 U/L (ref 0–37)
Albumin: 4.2 g/dL (ref 3.5–5.2)
BUN: 18 mg/dL (ref 6–23)
CALCIUM: 10.4 mg/dL (ref 8.4–10.5)
CHLORIDE: 95 meq/L — AB (ref 96–112)
CO2: 29 mEq/L (ref 19–32)
Creatinine, Ser: 0.7 mg/dL (ref 0.4–1.2)
GFR: 92.41 mL/min (ref >60.00)
GLUCOSE: 219 mg/dL — AB (ref 70–99)
POTASSIUM: 3.2 meq/L — AB (ref 3.5–5.1)
SODIUM: 135 meq/L (ref 135–145)
TOTAL PROTEIN: 6.7 g/dL (ref 6.0–8.3)
Total Bilirubin: 0.5 mg/dL (ref 0.2–1.2)

## 2014-01-03 LAB — HEMOGLOBIN A1C: Hgb A1c MFr Bld: 9.2 % — ABNORMAL HIGH (ref 4.6–6.5)

## 2014-01-03 LAB — MICROALBUMIN / CREATININE URINE RATIO
Creatinine,U: 63.6 mg/dL
Microalb Creat Ratio: 0.3 mg/g (ref 0.0–30.0)
Microalb, Ur: 0.2 mg/dL (ref 0.0–1.9)

## 2014-01-03 LAB — LDL CHOLESTEROL, DIRECT: LDL DIRECT: 103.3 mg/dL

## 2014-01-03 MED ORDER — METFORMIN HCL 850 MG PO TABS: ORAL_TABLET | ORAL | Status: DC

## 2014-01-03 MED ORDER — GLIPIZIDE 5 MG PO TABS: 5.0000 mg | ORAL_TABLET | Freq: Two times a day (BID) | ORAL | Status: DC

## 2014-01-03 MED ORDER — POTASSIUM CHLORIDE CRYS ER 20 MEQ PO TBCR: 20.0000 meq | EXTENDED_RELEASE_TABLET | Freq: Every day | ORAL | Status: DC

## 2014-01-03 NOTE — Assessment & Plan Note (Addendum)
Previously controlled with metformin and levemir,  Now uncontrolled .  Will increase metformin to 850 mg bid and start glipizide 5 mg bid,   She has no proteinuria .   Diabetic eye exam advised,. Foot exam normal .  Lab Results  Component Value Date   HGBA1C 9.2* 01/03/2014     Lab Results  Component Value Date   ALT 18 01/03/2014   AST 28 01/03/2014   ALKPHOS 75 01/03/2014   BILITOT 0.5 01/03/2014   Lab Results  Component Value Date   MICROALBUR 0.2 01/03/2014   Lab Results  Component Value Date   CHOL 164 01/03/2014   HDL 32.30* 01/03/2014   LDLCALC 87 09/04/2013   LDLDIRECT 103.3 01/03/2014   TRIG 320.0* 01/03/2014   CHOLHDL 5 01/03/2014

## 2014-01-03 NOTE — Assessment & Plan Note (Signed)
Annual comprehensive exam was done including breast, excluding pelvic and PAP smear. All screenings have been addressed and a printed health maintenance schedule was given to patient.   

## 2014-01-03 NOTE — Progress Notes (Signed)
Patient ID: Sharon Melton, female   DOB: 06/26/1960, 53 y.o.   MRN: [id number]  Subjective:     Sharon Melton is a 53 y.o. female and is here for a comprehensive physical exam. The patient reports no problems.  History   Social History  . Marital Status: Married    Spouse Name: N/A    Number of Children: N/A  . Years of Education: N/A   Occupational History  . Not on file.   Social History Main Topics  . Smoking status: Former Smoker    Quit date: 02/21/2007  . Smokeless tobacco: Never Used  . Alcohol Use: No  . Drug Use: Yes     Comment: occasional  . Sexual Activity: Not on file   Other Topics Concern  . Not on file   Social History Narrative  . No narrative on file   Health Maintenance  Topic Date Due  . Pneumococcal Polysaccharide Vaccine (##1) 03/25/1961  . Colonoscopy  03/25/2009  . Influenza Vaccine  12/21/2013  . Ophthalmology Exam  01/04/2014  . Mammogram  01/06/2014  . Hemoglobin A1c  03/06/2014  . Foot Exam  09/05/2014  . Urine Microalbumin  09/05/2014  . Pap Smear  12/02/2015  . Tetanus/tdap  10/24/2022    The following portions of the patient's history were reviewed and updated as appropriate: allergies, current medications, past family history, past medical history, past social history, past surgical history and problem list.  Review of Systems A comprehensive review of systems was negative.   Objective:   BP 134/78  Pulse 81  Temp(Src) 98.9 F (37.2 C) (Oral)  Resp 16  Ht 5' 6.5" (1.689 m)  Wt 171 lb 8 oz (77.792 kg)  BMI 27.27 kg/m2  SpO2 96%  General appearance: alert, cooperative and appears stated age Head: Normocephalic, without obvious abnormality, atraumatic Eyes: conjunctivae/corneas clear. PERRL, EOM's intact. Fundi benign. Ears: normal TM's and external ear canals both ears Nose: Nares normal. Septum midline. Mucosa normal. No drainage or sinus tenderness. Throat: lips, mucosa, and tongue normal; teeth and gums  normal Neck: no adenopathy, no carotid bruit, no JVD, supple, symmetrical, trachea midline and thyroid not enlarged, symmetric, no tenderness/mass/nodules Lungs: clear to auscultation bilaterally Breasts: normal appearance, no masses or tenderness Heart: regular rate and rhythm, S1, S2 normal, no murmur, click, rub or gallop Abdomen: soft, non-tender; bowel sounds normal; no masses,  no organomegaly Extremities: extremities normal, atraumatic, no cyanosis or edema Pulses: 2+ and symmetric Skin: Skin color, texture, turgor normal. No rashes or lesions Neurologic: Alert and oriented X 3, normal strength and tone. Normal symmetric reflexes. Normal coordination and gait.    Assessment and Plan:    Routine general medical examination at a health care facility Annual comprehensive exam was done including breast, excluding pelvic and PAP smear. All screenings have been addressed and a printed health maintenance schedule was given to patient.    Diabetes mellitus type II, uncontrolled Previously controlled with metformin and levemir,  Now uncontrolled .  Will increase metformin to 850 mg bid and start glipizide 5 mg bid,   She has no proteinuria .   Diabetic eye exam advised,. Foot exam normal .  Lab Results  Component Value Date   HGBA1C 9.2* 01/03/2014     Lab Results  Component Value Date   ALT 18 01/03/2014   AST 28 01/03/2014   ALKPHOS 75 01/03/2014   BILITOT 0.5 01/03/2014   Lab Results  Component Value Date   MICROALBUR 0.2  01/03/2014   Lab Results  Component Value Date   CHOL 164 01/03/2014   HDL 32.30* 01/03/2014   LDLCALC 87 09/04/2013   LDLDIRECT 103.3 01/03/2014   TRIG 320.0* 01/03/2014   CHOLHDL 5 01/03/2014         Updated Medication List Outpatient Encounter Prescriptions as of 01/01/2014  Medication Sig  . ALPRAZolam (XANAX) 0.5 MG tablet TAKE  TABLET BY MOUTH TWICE DAILY AS NEEDED FOR PANIC ATTACKS AND INSOMNIA  . atorvastatin (LIPITOR) 40 MG tablet TAKE 1 TABLET  (40 MG TOTAL) BY MOUTH DAILY.  Marland Kitchen clopidogrel (PLAVIX) 75 MG tablet TAKE 1 TABLET BY MOUTH EVERY DAY  . escitalopram (LEXAPRO) 10 MG tablet TAKE 1 TABLET (10 MG TOTAL) BY MOUTH DAILY.  Marland Kitchen losartan-hydrochlorothiazide (HYZAAR) 100-25 MG per tablet TAKE 1 TABLET BY MOUTH DAILY.  . metFORMIN (GLUCOPHAGE) 850 MG tablet TAKE 1 TABLET BY MOUTH 2 TIMES DAILY WITH A MEAL.  . [DISCONTINUED] ALPRAZolam (XANAX) 0.5 MG tablet TAKE 1/2-1 TABLET BY MOUTH AS NEEDED FOR PANIC ATTACKS  . [DISCONTINUED] metFORMIN (GLUCOPHAGE) 500 MG tablet TAKE 1 TABLET BY MOUTH 2 TIMES DAILY WITH A MEAL.  Marland Kitchen glipiZIDE (GLUCOTROL) 5 MG tablet Take 1 tablet (5 mg total) by mouth 2 (two) times daily before a meal.  . Insulin Pen Needle 32G X 4 MM MISC Use as directed once daily  . [DISCONTINUED] escitalopram (LEXAPRO) 10 MG tablet TAKE 1 TABLET BY MOUTH EVERY DAY

## 2014-01-05 ENCOUNTER — Other Ambulatory Visit: Payer: Self-pay | Admitting: Internal Medicine

## 2014-01-06 NOTE — Telephone Encounter (Signed)
Called and notified pt of mychart message. Had already began new medications. Verbalized understanding of checking blood sugars. Will call back to schedule one month follow up appt.

## 2014-03-26 ENCOUNTER — Ambulatory Visit: Payer: BC Managed Care – PPO | Admitting: Internal Medicine

## 2014-03-30 ENCOUNTER — Other Ambulatory Visit: Payer: Self-pay | Admitting: Internal Medicine

## 2014-04-26 ENCOUNTER — Other Ambulatory Visit: Payer: Self-pay | Admitting: Internal Medicine

## 2014-04-30 ENCOUNTER — Ambulatory Visit (INDEPENDENT_AMBULATORY_CARE_PROVIDER_SITE_OTHER): Payer: BC Managed Care – PPO | Admitting: Internal Medicine

## 2014-04-30 ENCOUNTER — Ambulatory Visit (INDEPENDENT_AMBULATORY_CARE_PROVIDER_SITE_OTHER): Payer: BC Managed Care – PPO | Admitting: *Deleted

## 2014-04-30 ENCOUNTER — Encounter: Payer: Self-pay | Admitting: Internal Medicine

## 2014-04-30 VITALS — BP 118/66 | HR 67 | Temp 98.1°F | Resp 14 | Ht 65.0 in | Wt 168.2 lb

## 2014-04-30 DIAGNOSIS — Z23 Encounter for immunization: Secondary | ICD-10-CM

## 2014-04-30 DIAGNOSIS — E785 Hyperlipidemia, unspecified: Secondary | ICD-10-CM

## 2014-04-30 DIAGNOSIS — IMO0002 Reserved for concepts with insufficient information to code with codable children: Secondary | ICD-10-CM

## 2014-04-30 DIAGNOSIS — I10 Essential (primary) hypertension: Secondary | ICD-10-CM

## 2014-04-30 DIAGNOSIS — E1165 Type 2 diabetes mellitus with hyperglycemia: Secondary | ICD-10-CM

## 2014-04-30 LAB — COMPREHENSIVE METABOLIC PANEL
ALT: 17 U/L (ref 0–35)
AST: 31 U/L (ref 0–37)
Albumin: 4.7 g/dL (ref 3.5–5.2)
Alkaline Phosphatase: 59 U/L (ref 39–117)
BILIRUBIN TOTAL: 0.3 mg/dL (ref 0.2–1.2)
BUN: 19 mg/dL (ref 6–23)
CO2: 30 meq/L (ref 19–32)
Calcium: 10 mg/dL (ref 8.4–10.5)
Chloride: 96 mEq/L (ref 96–112)
Creatinine, Ser: 0.8 mg/dL (ref 0.4–1.2)
GFR: 74.79 mL/min (ref >60.00)
Glucose, Bld: 167 mg/dL — ABNORMAL HIGH (ref 70–99)
POTASSIUM: 4.2 meq/L (ref 3.5–5.1)
SODIUM: 135 meq/L (ref 135–145)
TOTAL PROTEIN: 7.4 g/dL (ref 6.0–8.3)

## 2014-04-30 LAB — LIPID PANEL
Cholesterol: 181 mg/dL (ref 0–200)
HDL: 35.7 mg/dL — AB (ref >39.00)
NONHDL: 145.3
Total CHOL/HDL Ratio: 5
Triglycerides: 505 mg/dL — ABNORMAL HIGH (ref 0.0–149.0)
VLDL: 101 mg/dL — ABNORMAL HIGH (ref 0.0–40.0)

## 2014-04-30 LAB — LDL CHOLESTEROL, DIRECT: Direct LDL: 94.1 mg/dL

## 2014-04-30 LAB — HM DIABETES FOOT EXAM: HM Diabetic Foot Exam: NORMAL

## 2014-04-30 LAB — HEMOGLOBIN A1C: Hgb A1c MFr Bld: 7.5 % — ABNORMAL HIGH (ref 4.6–6.5)

## 2014-04-30 NOTE — Progress Notes (Signed)
Pre-visit discussion using our clinic review tool. No additional management support is needed unless otherwise documented below in the visit note.  

## 2014-04-30 NOTE — Progress Notes (Signed)
Patient ID: Sharon Melton, female   DOB: 04/06/1961, 53 y.o.   MRN: [id number]  Patient Active Problem List   Diagnosis Date Noted  . History of tobacco abuse 01/01/2014  . Atherosclerosis of native arteries of the extremities with intermittent claudication 09/04/2013  . Routine general medical examination at a health care facility 11/24/2012  . Screening for colon cancer 12/01/2011  . Obesity 12/01/2011  . Diabetes mellitus type II, uncontrolled 12/01/2011  . Generalized anxiety disorder 08/01/2011  . Essential hypertension 08/01/2011  . Hyperlipidemia with target LDL less than 70 08/01/2011  . Coronary artery disease 08/01/2011    Subjective:  CC:   Chief Complaint  Patient presents with  . Follow-up    Glipizide caused diarrhea  . Diabetes    HPI:   Sharon Brandyheresa J Reith is a 53 y.o. female who presents for  Diabetes follow up .  Patient has been taking metformin and metformin twice daily but has stopped the glipzide due to recurrent lower abd pain and loose stools.   She reports that her fasting sugars have ranged from  60 to 77,  Post prandials 130 to 270 depending on who is cooking dinner. Her mother in law frequently cooks and does not provide healthy choices.  She has not had her annual eye exam, , flu or pneumonia vaccines  .  She is not exercising .   Past Medical History  Diagnosis Date  . Myocardial infarction 02/2007    Inferior STEMI with bare metal [facility]  . Hypertension   . Hyperlipidemia   . Diabetes mellitus     Past Surgical History  Procedure Laterality Date  . Spine surgery      lumbar       The following portions of the patient's history were reviewed and updated as appropriate: Allergies, current medications, and problem list.    Review of Systems:   Patient denies headache, fevers, malaise, unintentional weight loss, skin rash, eye pain, sinus congestion and sinus pain, sore throat, dysphagia,  hemoptysis , cough,  dyspnea, wheezing, chest pain, palpitations, orthopnea, edema, abdominal pain, nausea, melena, diarrhea, constipation, flank pain, dysuria, hematuria, urinary  Frequency, nocturia, numbness, tingling, seizures,  Focal weakness, Loss of consciousness,  Tremor, insomnia, depression, anxiety, and suicidal ideation.     History   Social History  . Marital Status: Married    Spouse Name: N/A    Number of Children: N/A  . Years of Education: N/A   Occupational History  . Not on file.   Social History Main Topics  . Smoking status: Former Smoker    Quit date: 02/21/2007  . Smokeless tobacco: Never Used  . Alcohol Use: No  . Drug Use: Yes     Comment: occasional  . Sexual Activity: Not on file   Other Topics Concern  . Not on file   Social History Narrative    Objective:  Filed Vitals:   04/30/14 0815  BP: 118/66  Pulse: 67  Temp: 98.1 F (36.7 C)  Resp: 14     General appearance: alert, cooperative and appears stated age Ears: normal TM's and external ear canals both ears Throat: lips, mucosa, and tongue normal; teeth and gums normal Neck: no adenopathy, no carotid bruit, supple, symmetrical, trachea midline and thyroid not enlarged, symmetric, no tenderness/mass/nodules Back: symmetric, no curvature. ROM normal. No CVA tenderness. Lungs: clear to auscultation bilaterally Heart: regular rate and rhythm, S1, S2 normal, no murmur, click, rub or gallop  Abdomen: soft, non-tender; bowel sounds normal; no masses,  no organomegaly Pulses: 2+ and symmetric Skin: Skin color, texture, turgor normal. No rashes or lesions Lymph nodes: Cervical, supraclavicular, and axillary nodes normal.  Assessment and Plan:  Essential hypertension Well controlled on current regimen. Renal function stable, no changes today.  Lab Results  Component Value Date   CREATININE 0.8 04/30/2014   Lab Results  Component Value Date   NA 135 04/30/2014   K 4.2 04/30/2014   CL 96 04/30/2014   CO2  30 04/30/2014     Diabetes mellitus type II, uncontrolled Improving control with metformin and 850 mg bid and reduced use of glipizde due to reports of diarrhea.  She has no proteinuria .   Diabetic eye exam advised,. Foot exam normal . Will start fenofibrate to triglycerides > 500  Lab Results  Component Value Date   HGBA1C 7.5* 04/30/2014     Lab Results  Component Value Date   ALT 17 04/30/2014   AST 31 04/30/2014   ALKPHOS 59 04/30/2014   BILITOT 0.3 04/30/2014   Lab Results  Component Value Date   MICROALBUR 0.2 01/03/2014   Lab Results  Component Value Date   CHOL 181 04/30/2014   HDL 35.70* 04/30/2014   LDLCALC 87 09/04/2013   LDLDIRECT 94.1 04/30/2014   TRIG * 04/30/2014    505.0 Triglyceride is over 400; calculations on Lipids are invalid.   CHOLHDL 5 04/30/2014          Hyperlipidemia with target LDL less than 70 Adding fenofibrate for trigs > 500   Updated Medication List Outpatient Encounter Prescriptions as of 04/30/2014  Medication Sig  . ALPRAZolam (XANAX) 0.5 MG tablet TAKE  TABLET BY MOUTH TWICE DAILY AS NEEDED FOR PANIC ATTACKS AND INSOMNIA  . atorvastatin (LIPITOR) 40 MG tablet TAKE 1 TABLET (40 MG TOTAL) BY MOUTH DAILY.  Marland Kitchen. clopidogrel (PLAVIX) 75 MG tablet TAKE 1 TABLET BY MOUTH EVERY DAY  . escitalopram (LEXAPRO) 10 MG tablet TAKE 1 TABLET (10 MG TOTAL) BY MOUTH DAILY.  Marland Kitchen. escitalopram (LEXAPRO) 10 MG tablet TAKE 1 TABLET BY MOUTH EVERY DAY  . KLOR-CON M20 20 MEQ tablet TAKE 1 TABLET (20 MEQ TOTAL) BY MOUTH DAILY.  Marland Kitchen. losartan-hydrochlorothiazide (HYZAAR) 100-25 MG per tablet TAKE 1 TABLET BY MOUTH DAILY.  . metFORMIN (GLUCOPHAGE) 850 MG tablet TAKE 1 TABLET BY MOUTH 2 TIMES DAILY WITH A MEAL.  . fenofibrate (TRICOR) 145 MG tablet Take 1 tablet (145 mg total) by mouth daily.  Marland Kitchen. glipiZIDE (GLUCOTROL) 5 MG tablet TAKE 1 TABLET (5 MG TOTAL) BY MOUTH 2 (TWO) TIMES DAILY BEFORE A MEAL. (Patient not taking: Reported on 04/30/2014)  . [DISCONTINUED]  Insulin Pen Needle 32G X 4 MM MISC Use as directed once daily (Patient not taking: Reported on 04/30/2014)     Orders Placed This Encounter  Procedures  . Pneumococcal polysaccharide vaccine 23-valent greater than or equal to 2yo subcutaneous/IM  . Comprehensive metabolic panel  . Hemoglobin A1c  . Lipid panel  . LDL cholesterol, direct  . HM DIABETES FOOT EXAM    Return in about 3 months (around 07/30/2014) for follow up diabetes.

## 2014-04-30 NOTE — Patient Instructions (Signed)
Please confirm that the glipizide,  Not the metformin is the one causing loose stools and crmaping ,  Because usually the metformin does that and does NOT cause low blood sugars  PLEASE GET YOUR EYES EXAMINED PRIOR TO YOUR NEXT VISIT

## 2014-05-01 ENCOUNTER — Encounter: Payer: Self-pay | Admitting: Internal Medicine

## 2014-05-01 MED ORDER — FENOFIBRATE 145 MG PO TABS: 145.0000 mg | ORAL_TABLET | Freq: Every day | ORAL | Status: DC

## 2014-05-01 NOTE — Assessment & Plan Note (Signed)
Well controlled on current regimen. Renal function stable, no changes today.  Lab Results  Component Value Date   CREATININE 0.8 04/30/2014   Lab Results  Component Value Date   NA 135 04/30/2014   K 4.2 04/30/2014   CL 96 04/30/2014   CO2 30 04/30/2014

## 2014-05-01 NOTE — Assessment & Plan Note (Addendum)
Improving control with metformin and 850 mg bid and glipizide 5 mg bid,   She has no proteinuria .   Diabetic eye exam advised,. Foot exam normal . Will start fenofibrate to triglycerides > 500  Lab Results  Component Value Date   HGBA1C 7.5* 04/30/2014     Lab Results  Component Value Date   ALT 17 04/30/2014   AST 31 04/30/2014   ALKPHOS 59 04/30/2014   BILITOT 0.3 04/30/2014   Lab Results  Component Value Date   MICROALBUR 0.2 01/03/2014   Lab Results  Component Value Date   CHOL 181 04/30/2014   HDL 35.70* 04/30/2014   LDLCALC 87 09/04/2013   LDLDIRECT 94.1 04/30/2014   TRIG * 04/30/2014    505.0 Triglyceride is over 400; calculations on Lipids are invalid.   CHOLHDL 5 04/30/2014

## 2014-05-01 NOTE — Assessment & Plan Note (Signed)
Adding fenofibrate for trigs > 500

## 2014-05-29 ENCOUNTER — Other Ambulatory Visit: Payer: Self-pay | Admitting: Internal Medicine

## 2014-06-25 ENCOUNTER — Other Ambulatory Visit: Payer: Self-pay | Admitting: Internal Medicine

## 2014-06-28 ENCOUNTER — Other Ambulatory Visit: Payer: Self-pay | Admitting: Internal Medicine

## 2014-06-30 ENCOUNTER — Other Ambulatory Visit: Payer: Self-pay | Admitting: *Deleted

## 2014-06-30 MED ORDER — ALPRAZOLAM 0.5 MG PO TABS: ORAL_TABLET | ORAL | Status: DC

## 2014-06-30 NOTE — Progress Notes (Signed)
Faxed to pharmacy

## 2014-07-26 ENCOUNTER — Other Ambulatory Visit: Payer: Self-pay | Admitting: Internal Medicine

## 2014-07-28 ENCOUNTER — Telehealth: Payer: Self-pay | Admitting: *Deleted

## 2014-07-28 NOTE — Telephone Encounter (Signed)
PA denied. It can be approved if patient has a history of failure or intolerance to fenofibrate 54mg  or 160mg .

## 2014-07-28 NOTE — Telephone Encounter (Signed)
Fax from [facility], needing PA for Fenofibrate 145 mg. Started online, pending response.

## 2014-08-23 ENCOUNTER — Other Ambulatory Visit: Payer: Self-pay | Admitting: Internal Medicine

## 2014-08-25 ENCOUNTER — Other Ambulatory Visit: Payer: Self-pay | Admitting: Internal Medicine

## 2014-09-19 ENCOUNTER — Other Ambulatory Visit: Payer: Self-pay | Admitting: Internal Medicine

## 2014-09-22 ENCOUNTER — Other Ambulatory Visit: Payer: Self-pay | Admitting: Internal Medicine

## 2014-10-20 ENCOUNTER — Other Ambulatory Visit: Payer: Self-pay | Admitting: Internal Medicine

## 2014-11-19 ENCOUNTER — Other Ambulatory Visit: Payer: Self-pay | Admitting: Internal Medicine

## 2014-11-19 NOTE — Telephone Encounter (Signed)
Sent mychart on need for appt 

## 2014-12-15 ENCOUNTER — Other Ambulatory Visit: Payer: Self-pay | Admitting: Internal Medicine

## 2014-12-15 DIAGNOSIS — IMO0002 Reserved for concepts with insufficient information to code with codable children: Secondary | ICD-10-CM

## 2014-12-15 DIAGNOSIS — E785 Hyperlipidemia, unspecified: Secondary | ICD-10-CM

## 2014-12-15 DIAGNOSIS — E1165 Type 2 diabetes mellitus with hyperglycemia: Secondary | ICD-10-CM

## 2014-12-15 NOTE — Telephone Encounter (Signed)
Last OV 12/15 ok to fill? 

## 2014-12-17 NOTE — Telephone Encounter (Signed)
Refill for 30 days only.  Will need six month follow up WITH FASTING LABS AND a1C prior to any more refills

## 2014-12-17 NOTE — Telephone Encounter (Signed)
Pt scheduled  

## 2014-12-18 ENCOUNTER — Other Ambulatory Visit: Payer: Self-pay | Admitting: Internal Medicine

## 2014-12-19 ENCOUNTER — Other Ambulatory Visit: Payer: Self-pay | Admitting: Internal Medicine

## 2014-12-22 NOTE — Telephone Encounter (Signed)
Last OV 12.9.15, next OV 8.3.16.  Please advise refill

## 2014-12-22 NOTE — Telephone Encounter (Signed)
Ok to refill,  printed rx  

## 2014-12-22 NOTE — Telephone Encounter (Signed)
rx faxed

## 2014-12-23 ENCOUNTER — Other Ambulatory Visit (INDEPENDENT_AMBULATORY_CARE_PROVIDER_SITE_OTHER): Payer: 59

## 2014-12-23 DIAGNOSIS — E1165 Type 2 diabetes mellitus with hyperglycemia: Secondary | ICD-10-CM | POA: Diagnosis not present

## 2014-12-23 DIAGNOSIS — IMO0002 Reserved for concepts with insufficient information to code with codable children: Secondary | ICD-10-CM

## 2014-12-23 LAB — COMPREHENSIVE METABOLIC PANEL
ALT: 22 U/L (ref 0–35)
AST: 28 U/L (ref 0–37)
Albumin: 4.9 g/dL (ref 3.5–5.2)
Alkaline Phosphatase: 65 U/L (ref 39–117)
BUN: 15 mg/dL (ref 6–23)
CHLORIDE: 99 meq/L (ref 96–112)
CO2: 31 mEq/L (ref 19–32)
CREATININE: 0.89 mg/dL (ref 0.40–1.20)
Calcium: 10.4 mg/dL (ref 8.4–10.5)
GFR: 69.8 mL/min (ref >60.00)
Glucose, Bld: 152 mg/dL — ABNORMAL HIGH (ref 70–99)
POTASSIUM: 3.9 meq/L (ref 3.5–5.1)
Sodium: 137 mEq/L (ref 135–145)
Total Bilirubin: 0.6 mg/dL (ref 0.2–1.2)
Total Protein: 7.8 g/dL (ref 6.0–8.3)

## 2014-12-23 LAB — LDL CHOLESTEROL, DIRECT: Direct LDL: 113 mg/dL

## 2014-12-23 LAB — LIPID PANEL
Cholesterol: 176 mg/dL (ref 0–200)
HDL: 39.1 mg/dL (ref >39.00)
LDL Cholesterol: 101 mg/dL — ABNORMAL HIGH (ref 0–99)
NonHDL: 137.19
Total CHOL/HDL Ratio: 5
Triglycerides: 179 mg/dL — ABNORMAL HIGH (ref 0.0–149.0)
VLDL: 35.8 mg/dL (ref 0.0–40.0)

## 2014-12-23 LAB — HEMOGLOBIN A1C: Hgb A1c MFr Bld: 7.6 % — ABNORMAL HIGH (ref 4.6–6.5)

## 2014-12-23 LAB — MICROALBUMIN / CREATININE URINE RATIO
Creatinine,U: 177.8 mg/dL
MICROALB/CREAT RATIO: 0.9 mg/g (ref 0.0–30.0)
Microalb, Ur: 1.6 mg/dL (ref 0.0–1.9)

## 2014-12-24 ENCOUNTER — Ambulatory Visit (INDEPENDENT_AMBULATORY_CARE_PROVIDER_SITE_OTHER): Payer: 59 | Admitting: Internal Medicine

## 2014-12-24 ENCOUNTER — Encounter: Payer: Self-pay | Admitting: Internal Medicine

## 2014-12-24 VITALS — BP 120/74 | HR 62 | Temp 98.2°F | Wt 167.0 lb

## 2014-12-24 DIAGNOSIS — E785 Hyperlipidemia, unspecified: Secondary | ICD-10-CM

## 2014-12-24 DIAGNOSIS — I10 Essential (primary) hypertension: Secondary | ICD-10-CM

## 2014-12-24 DIAGNOSIS — E1165 Type 2 diabetes mellitus with hyperglycemia: Secondary | ICD-10-CM

## 2014-12-24 DIAGNOSIS — Z1239 Encounter for other screening for malignant neoplasm of breast: Secondary | ICD-10-CM

## 2014-12-24 DIAGNOSIS — IMO0002 Reserved for concepts with insufficient information to code with codable children: Secondary | ICD-10-CM

## 2014-12-24 MED ORDER — METFORMIN HCL 850 MG PO TABS: 850.0000 mg | ORAL_TABLET | Freq: Two times a day (BID) | ORAL | Status: DC

## 2014-12-24 MED ORDER — ALPRAZOLAM 0.5 MG PO TABS: 0.5000 mg | ORAL_TABLET | Freq: Two times a day (BID) | ORAL | Status: DC | PRN

## 2014-12-24 NOTE — Patient Instructions (Addendum)
Add 1/2 tablet of glipizide twice daily before breakfast and dinner  Continue the metformin as you are doing   Mammogram and cologuard ordered

## 2014-12-24 NOTE — Progress Notes (Signed)
Subjective:  Patient ID: Sharon Melton, female    DOB: May 01, 1961  Age: 54 y.o. MRN: [id number]  CC: The primary encounter diagnosis was Screening for breast cancer. Diagnoses of Diabetes mellitus type II, uncontrolled, Hyperlipidemia with target LDL less than 70, and Essential hypertension were also pertinent to this visit.  HPI Sharon Melton presents for diabetes follow up on Type 2 diabetes, hyperlipidemia, hypertension and CAD .  She was last seen in December.     She  feels generally well, is not exercising regularly  and checking blood sugars infrequentlyat variable times.  Denies any recent hypoglyemic events.  Taking her medications as directed. Following a carbohydrate modified diet 6 days per week. Denies numbness, burning and tingling of extremities. Appetite is good.  She is overdue for eye exam   She denies any recent episodes of chest pain, orthopnea or claudication.  She has maintained a tobacco free lifestyle since 2008.     Outpatient Prescriptions Prior to Visit  Medication Sig Dispense Refill  . atorvastatin (LIPITOR) 40 MG tablet TAKE 1 TABLET (40 MG TOTAL) BY MOUTH DAILY. 90 tablet 0  . clopidogrel (PLAVIX) 75 MG tablet TAKE 1 TABLET BY MOUTH EVERY DAY 30 tablet 5  . escitalopram (LEXAPRO) 10 MG tablet TAKE 1 TABLET BY MOUTH EVERY DAY 30 tablet 0  . KLOR-CON M20 20 MEQ tablet TAKE 1 TABLET (20 MEQ TOTAL) BY MOUTH DAILY. 30 tablet 5  . losartan-hydrochlorothiazide (HYZAAR) 100-25 MG per tablet TAKE 1 TABLET BY MOUTH DAILY. 90 tablet 1  . ALPRAZolam (XANAX) 0.5 MG tablet Take 1 tablet (0.5 mg total) by mouth 2 (two) times daily as needed. 60 tablet 0  . losartan-hydrochlorothiazide (HYZAAR) 100-25 MG per tablet TAKE 1 TABLET BY MOUTH DAILY. 30 tablet 0  . losartan-hydrochlorothiazide (HYZAAR) 100-25 MG per tablet TAKE 1 TABLET BY MOUTH DAILY. 90 tablet 0  . metFORMIN (GLUCOPHAGE) 850 MG tablet TAKE 1 TABLET BY MOUTH 2 TIMES DAILY WITH A MEAL. 180 tablet 1  .  fenofibrate (TRICOR) 145 MG tablet Take 1 tablet (145 mg total) by mouth daily. (Patient not taking: Reported on 12/24/2014) 90 tablet 1  . glipiZIDE (GLUCOTROL) 5 MG tablet TAKE 1 TABLET (5 MG TOTAL) BY MOUTH 2 (TWO) TIMES DAILY BEFORE A MEAL. (Patient not taking: Reported on 04/30/2014) 60 tablet 3   No facility-administered medications prior to visit.    Review of Systems;  Patient denies headache, fevers, malaise, unintentional weight loss, skin rash, eye pain, sinus congestion and sinus pain, sore throat, dysphagia,  hemoptysis , cough, dyspnea, wheezing, chest pain, palpitations, orthopnea, edema, abdominal pain, nausea, melena, diarrhea, constipation, flank pain, dysuria, hematuria, urinary  Frequency, nocturia, numbness, tingling, seizures,  Focal weakness, Loss of consciousness,  Tremor, insomnia, depression, anxiety, and suicidal ideation.      Objective:  BP 120/74 mmHg  Pulse 62  Temp(Src) 98.2 F (36.8 C) (Oral)  Wt 167 lb (75.751 kg)  SpO2 98%  BP Readings from Last 3 Encounters:  12/24/14 120/74  04/30/14 118/66  01/01/14 134/78    Wt Readings from Last 3 Encounters:  12/24/14 167 lb (75.751 kg)  04/30/14 168 lb 4 oz (76.318 kg)  01/01/14 171 lb 8 oz (77.792 kg)    General appearance: alert, cooperative and appears stated age Ears: normal TM's and external ear canals both ears Throat: lips, mucosa, and tongue normal; teeth and gums normal Neck: no adenopathy, no carotid bruit, supple, symmetrical, trachea midline and thyroid not enlarged, symmetric, no  tenderness/mass/nodules Back: symmetric, no curvature. ROM normal. No CVA tenderness. Lungs: clear to auscultation bilaterally Heart: regular rate and rhythm, S1, S2 normal, no murmur, click, rub or gallop Abdomen: soft, non-tender; bowel sounds normal; no masses,  no organomegaly Pulses: 2+ and symmetric Skin: Skin color, texture, turgor normal. No rashes or lesions Lymph nodes: Cervical, supraclavicular, and  axillary nodes normal. Foot exam:  Nails are well trimmed,  No callouses,  Sensation intact to microfilament  Lab Results  Component Value Date   HGBA1C 7.6* 12/23/2014   HGBA1C 7.5* 04/30/2014   HGBA1C 9.2* 01/03/2014    Lab Results  Component Value Date   CREATININE 0.89 12/23/2014   CREATININE 0.8 04/30/2014   CREATININE 0.7 01/03/2014    Lab Results  Component Value Date   GLUCOSE 152* 12/23/2014   CHOL 176 12/23/2014   TRIG 179.0* 12/23/2014   HDL 39.10 12/23/2014   LDLDIRECT 113.0 12/23/2014   LDLCALC 101* 12/23/2014   ALT 22 12/23/2014   AST 28 12/23/2014   NA 137 12/23/2014   K 3.9 12/23/2014   CL 99 12/23/2014   CREATININE 0.89 12/23/2014   BUN 15 12/23/2014   CO2 31 12/23/2014   TSH 0.87 12/07/2012   HGBA1C 7.6* 12/23/2014   MICROALBUR 1.6 12/23/2014    No results found.  Assessment & Plan:   Problem List Items Addressed This Visit      Unprioritized   Essential hypertension    Well controlled on current regimen. Renal function stable, no changes today.  Lab Results  Component Value Date   CREATININE 0.89 12/23/2014   Lab Results  Component Value Date   NA 137 12/23/2014   K 3.9 12/23/2014   CL 99 12/23/2014   CO2 31 12/23/2014         Hyperlipidemia with target LDL less than 70    LDL and triglycerides have been addressed with high potency statin and fenofibrate.   Lab Results  Component Value Date   CHOL 176 12/23/2014   HDL 39.10 12/23/2014   LDLCALC 101* 12/23/2014   LDLDIRECT 113.0 12/23/2014   TRIG 179.0* 12/23/2014   CHOLHDL 5 12/23/2014   Lab Results  Component Value Date   ALT 22 12/23/2014   AST 28 12/23/2014   ALKPHOS 65 12/23/2014   BILITOT 0.6 12/23/2014           Diabetes mellitus type II, uncontrolled    Improving but not yet qt goal on metformin 850 mg bid .  Adding back glipizide 2.5 mg bid, . She has no proteinuria .   Diabetic eye exam advised,. Foot exam normal .   Lab Results  Component Value Date    HGBA1C 7.6* 12/23/2014     Lab Results  Component Value Date   ALT 22 12/23/2014   AST 28 12/23/2014   ALKPHOS 65 12/23/2014   BILITOT 0.6 12/23/2014   Lab Results  Component Value Date   MICROALBUR 1.6 12/23/2014   Lab Results  Component Value Date   CHOL 176 12/23/2014   HDL 39.10 12/23/2014   LDLCALC 101* 12/23/2014   LDLDIRECT 113.0 12/23/2014   TRIG 179.0* 12/23/2014   CHOLHDL 5 12/23/2014                Relevant Medications   metFORMIN (GLUCOPHAGE) 850 MG tablet    Other Visit Diagnoses    Screening for breast cancer    -  Primary    Relevant Orders    MM DIGITAL SCREENING BILATERAL  I have changed Ms. Pulice's metFORMIN. I am also having her maintain her glipiZIDE, fenofibrate, KLOR-CON M20, losartan-hydrochlorothiazide, clopidogrel, escitalopram, atorvastatin, and ALPRAZolam.  Meds ordered this encounter  Medications  . metFORMIN (GLUCOPHAGE) 850 MG tablet    Sig: Take 1 tablet (850 mg total) by mouth 2 (two) times daily with a meal.    Dispense:  180 tablet    Refill:  1  . ALPRAZolam (XANAX) 0.5 MG tablet    Sig: Take 1 tablet (0.5 mg total) by mouth 2 (two) times daily as needed.    Dispense:  60 tablet    Refill:  0    May refill on or after Sept 1 2016    Medications Discontinued During This Encounter  Medication Reason  . losartan-hydrochlorothiazide (HYZAAR) 100-25 MG per tablet Duplicate  . losartan-hydrochlorothiazide (HYZAAR) 100-25 MG per tablet Duplicate  . metFORMIN (GLUCOPHAGE) 850 MG tablet Reorder  . ALPRAZolam (XANAX) 0.5 MG tablet Reorder    Follow-up: Return in about 3 months (around 03/26/2015) for follow up diabetes.   Sherlene Shams, MD

## 2014-12-26 ENCOUNTER — Encounter: Payer: Self-pay | Admitting: Internal Medicine

## 2014-12-26 NOTE — Assessment & Plan Note (Addendum)
Improving but not yet qt goal on metformin 850 mg bid .  Adding back glipizide 2.5 mg bid, . She has no proteinuria .   Diabetic eye exam advised,. Foot exam normal .   Lab Results  Component Value Date   HGBA1C 7.6* 12/23/2014     Lab Results  Component Value Date   ALT 22 12/23/2014   AST 28 12/23/2014   ALKPHOS 65 12/23/2014   BILITOT 0.6 12/23/2014   Lab Results  Component Value Date   MICROALBUR 1.6 12/23/2014   Lab Results  Component Value Date   CHOL 176 12/23/2014   HDL 39.10 12/23/2014   LDLCALC 101* 12/23/2014   LDLDIRECT 113.0 12/23/2014   TRIG 179.0* 12/23/2014   CHOLHDL 5 12/23/2014

## 2014-12-26 NOTE — Assessment & Plan Note (Signed)
LDL and triglycerides have been addressed with high potency statin and fenofibrate.   Lab Results  Component Value Date   CHOL 176 12/23/2014   HDL 39.10 12/23/2014   LDLCALC 101* 12/23/2014   LDLDIRECT 113.0 12/23/2014   TRIG 179.0* 12/23/2014   CHOLHDL 5 12/23/2014   Lab Results  Component Value Date   ALT 22 12/23/2014   AST 28 12/23/2014   ALKPHOS 65 12/23/2014   BILITOT 0.6 12/23/2014

## 2014-12-26 NOTE — Assessment & Plan Note (Signed)
Well controlled on current regimen. Renal function stable, no changes today.  Lab Results  Component Value Date   CREATININE 0.89 12/23/2014   Lab Results  Component Value Date   NA 137 12/23/2014   K 3.9 12/23/2014   CL 99 12/23/2014   CO2 31 12/23/2014

## 2014-12-27 ENCOUNTER — Encounter: Payer: Self-pay | Admitting: Internal Medicine

## 2015-01-13 NOTE — Telephone Encounter (Signed)
Mailed unread message to patient.  

## 2015-01-19 ENCOUNTER — Other Ambulatory Visit: Payer: Self-pay | Admitting: Internal Medicine

## 2015-02-18 ENCOUNTER — Other Ambulatory Visit: Payer: Self-pay | Admitting: Internal Medicine

## 2015-03-05 ENCOUNTER — Other Ambulatory Visit: Payer: Self-pay | Admitting: Internal Medicine

## 2015-03-09 ENCOUNTER — Other Ambulatory Visit: Payer: Self-pay | Admitting: *Deleted

## 2015-03-09 MED ORDER — POTASSIUM CHLORIDE CRYS ER 20 MEQ PO TBCR: EXTENDED_RELEASE_TABLET | ORAL | Status: DC

## 2015-03-09 NOTE — Progress Notes (Signed)
Refill sent.

## 2015-03-18 ENCOUNTER — Other Ambulatory Visit: Payer: Self-pay | Admitting: Internal Medicine

## 2015-03-19 ENCOUNTER — Other Ambulatory Visit: Payer: Self-pay

## 2015-03-19 MED ORDER — ALPRAZOLAM 0.5 MG PO TABS: ORAL_TABLET | ORAL | Status: DC

## 2015-03-19 NOTE — Telephone Encounter (Signed)
Please advise on refill of Xanax and how many refills?

## 2015-03-19 NOTE — Telephone Encounter (Signed)
Please advise? Pt was seen last in august.

## 2015-03-19 NOTE — Telephone Encounter (Signed)
Ok to refill,  printed rx  

## 2015-03-23 ENCOUNTER — Telehealth: Payer: Self-pay | Admitting: *Deleted

## 2015-03-23 ENCOUNTER — Other Ambulatory Visit (INDEPENDENT_AMBULATORY_CARE_PROVIDER_SITE_OTHER): Payer: 59

## 2015-03-23 DIAGNOSIS — E119 Type 2 diabetes mellitus without complications: Secondary | ICD-10-CM

## 2015-03-23 LAB — LIPID PANEL
CHOL/HDL RATIO: 7
Cholesterol: 253 mg/dL — ABNORMAL HIGH (ref 0–200)
HDL: 38.8 mg/dL — ABNORMAL LOW (ref >39.00)
NonHDL: 214.46
TRIGLYCERIDES: 315 mg/dL — AB (ref 0.0–149.0)
VLDL: 63 mg/dL — ABNORMAL HIGH (ref 0.0–40.0)

## 2015-03-23 LAB — COMPREHENSIVE METABOLIC PANEL
ALBUMIN: 4.6 g/dL (ref 3.5–5.2)
ALK PHOS: 70 U/L (ref 39–117)
ALT: 18 U/L (ref 0–35)
AST: 22 U/L (ref 0–37)
BILIRUBIN TOTAL: 0.3 mg/dL (ref 0.2–1.2)
BUN: 16 mg/dL (ref 6–23)
CALCIUM: 11 mg/dL — AB (ref 8.4–10.5)
CHLORIDE: 99 meq/L (ref 96–112)
CO2: 29 mEq/L (ref 19–32)
CREATININE: 0.79 mg/dL (ref 0.40–1.20)
GFR: 80.02 mL/min (ref >60.00)
Glucose, Bld: 190 mg/dL — ABNORMAL HIGH (ref 70–99)
Potassium: 3.8 mEq/L (ref 3.5–5.1)
SODIUM: 139 meq/L (ref 135–145)
TOTAL PROTEIN: 7.4 g/dL (ref 6.0–8.3)

## 2015-03-23 LAB — LDL CHOLESTEROL, DIRECT: Direct LDL: 169 mg/dL

## 2015-03-23 LAB — HEMOGLOBIN A1C: Hgb A1c MFr Bld: 7.4 % — ABNORMAL HIGH (ref 4.6–6.5)

## 2015-03-23 NOTE — Telephone Encounter (Signed)
Yes it can be held til then

## 2015-03-23 NOTE — Telephone Encounter (Signed)
Labs and dx?  

## 2015-03-23 NOTE — Telephone Encounter (Signed)
She is too early.  a1c not due until nov 2nd  Or insurance may not pay .  cna the blood be held until Wednesday?

## 2015-03-25 ENCOUNTER — Ambulatory Visit (INDEPENDENT_AMBULATORY_CARE_PROVIDER_SITE_OTHER): Payer: 59 | Admitting: Internal Medicine

## 2015-03-25 ENCOUNTER — Encounter: Payer: Self-pay | Admitting: Internal Medicine

## 2015-03-25 VITALS — BP 126/78 | HR 72 | Temp 98.7°F | Resp 12 | Ht 65.0 in | Wt 167.5 lb

## 2015-03-25 DIAGNOSIS — I10 Essential (primary) hypertension: Secondary | ICD-10-CM

## 2015-03-25 DIAGNOSIS — I70219 Atherosclerosis of native arteries of extremities with intermittent claudication, unspecified extremity: Secondary | ICD-10-CM

## 2015-03-25 DIAGNOSIS — I739 Peripheral vascular disease, unspecified: Secondary | ICD-10-CM

## 2015-03-25 DIAGNOSIS — Z23 Encounter for immunization: Secondary | ICD-10-CM | POA: Diagnosis not present

## 2015-03-25 DIAGNOSIS — E785 Hyperlipidemia, unspecified: Secondary | ICD-10-CM

## 2015-03-25 DIAGNOSIS — I70213 Atherosclerosis of native arteries of extremities with intermittent claudication, bilateral legs: Secondary | ICD-10-CM

## 2015-03-25 NOTE — Patient Instructions (Signed)
Your diabetes control is improving .  No changes needed to your medications.   Your exertional hip pain may be coming from a blocked artery so [facility] Dr Wyn Qu[place]rew for further evaluation   Please return in a week to repeat your calcium level

## 2015-03-25 NOTE — Progress Notes (Signed)
Subjective:  Patient ID: Sharon Melton, female    DOB: 03/16/1961  Age: 54 y.o. MRN: [id number]  CC: The primary encounter diagnosis was Need for prophylactic vaccination against Streptococcus pneumoniae (pneumococcus). Diagnoses of Encounter for immunization, Atherosclerotic peripheral vascular disease with intermittent claudication (HCC), Hypercalcemia, Essential hypertension, Atherosclerosis of native artery of both lower extremities with intermittent claudication (HCC), and Hyperlipidemia with target LDL less than 70 were also pertinent to this visit.  HPI Sharon Melton presents for diabetes follow up  On Type 2 DM, hypertension and hyperlipidemia.  Last seen in august,.   sHe feels generally well, is exercising several times per week and checking blood sugars once daily at variable times.  BS have been under 130 fasting and < 150 post prandially.  Denies any recent hypoglyemic events.  Taking her medications as directed. Following a carbohydrate modified diet 6 days per week. Denies numbness, burning and tingling of extremities. Appetite is good.    She is tolerating her medications without side effects but has been having intermittent pain involving both hips and buttocks that occurs if she walks faster than a leisurely pace.  The pain resolves with rest .  She describes it as an intense heaviness  That stops her in itw tracks.  It has limited her tolerance for exercise.    She has not smoked since her AMI over 5 years ago.    Has been taking plavix, lipitor and asa regularly  Lab Results  Component Value Date   HGBA1C 7.4* 03/23/2015     Outpatient Prescriptions Prior to Visit  Medication Sig Dispense Refill  . ALPRAZolam (XANAX) 0.5 MG tablet take 1 tablet by mouth twice a day if needed 60 tablet 3  . atorvastatin (LIPITOR) 40 MG tablet TAKE 1 TABLET (40 MG TOTAL) BY MOUTH DAILY. 90 tablet 0  . clopidogrel (PLAVIX) 75 MG tablet TAKE 1 TABLET BY MOUTH EVERY DAY 30 tablet 5    . escitalopram (LEXAPRO) 10 MG tablet take 1 tablet by mouth once daily SCHEDULE OFFICE APPOINTMENT ASAP 30 tablet 0  . escitalopram (LEXAPRO) 10 MG tablet take 1 tablet by mouth once daily SCHEDULE OFFICE APPOINTMENT ASAP 30 tablet 0  . fenofibrate (TRICOR) 145 MG tablet Take 1 tablet (145 mg total) by mouth daily. 90 tablet 1  . glipiZIDE (GLUCOTROL) 5 MG tablet TAKE 1 TABLET (5 MG TOTAL) BY MOUTH 2 (TWO) TIMES DAILY BEFORE A MEAL. 60 tablet 3  . losartan-hydrochlorothiazide (HYZAAR) 100-25 MG per tablet TAKE 1 TABLET BY MOUTH DAILY. 90 tablet 1  . metFORMIN (GLUCOPHAGE) 850 MG tablet Take 1 tablet (850 mg total) by mouth 2 (two) times daily with a meal. 180 tablet 1  . potassium chloride SA (KLOR-CON M20) 20 MEQ tablet TAKE 1 TABLET (20 MEQ TOTAL) BY MOUTH DAILY. 30 tablet 2   No facility-administered medications prior to visit.    Review of Systems;  Patient denies headache, fevers, malaise, unintentional weight loss, skin rash, eye pain, sinus congestion and sinus pain, sore throat, dysphagia,  hemoptysis , cough, dyspnea, wheezing, chest pain, palpitations, orthopnea, edema, abdominal pain, nausea, melena, diarrhea, constipation, flank pain, dysuria, hematuria, urinary  Frequency, nocturia, numbness, tingling, seizures,  Focal weakness, Loss of consciousness,  Tremor, insomnia, depression, anxiety, and suicidal ideation.      Objective:  BP 126/78 mmHg  Pulse 72  Temp(Src) 98.7 F (37.1 C) (Oral)  Resp 12  Ht  (1.651 m)  Wt 167 lb 8 oz (75.978 kg)  BMI 27.87 kg/m2  SpO2 98%  BP Readings from Last 3 Encounters:  03/25/15 126/78  12/24/14 120/74  04/30/14 118/66    Wt Readings from Last 3 Encounters:  03/25/15 167 lb 8 oz (75.978 kg)  12/24/14 167 lb (75.751 kg)  04/30/14 168 lb 4 oz (76.318 kg)    General appearance: alert, cooperative and appears stated age Ears: normal TM's and external ear canals both ears Throat: lips, mucosa, and tongue normal; teeth and  gums normal Neck: no adenopathy, no carotid bruit, supple, symmetrical, trachea midline and thyroid not enlarged, symmetric, no tenderness/mass/nodules Back: symmetric, no curvature. ROM normal. No CVA tenderness. Lungs: clear to auscultation bilaterally Heart: regular rate and rhythm, S1, S2 normal, no murmur, click, rub or gallop Abdomen: soft, non-tender; bowel sounds normal; no masses,  no organomegaly Pulses: 2+ and symmetric Skin: Skin color, texture, turgor normal. No rashes or lesions Lymph nodes: Cervical, supraclavicular, and axillary nodes normal.  Lab Results  Component Value Date   HGBA1C 7.4* 03/23/2015   HGBA1C 7.6* 12/23/2014   HGBA1C 7.5* 04/30/2014    Lab Results  Component Value Date   CREATININE 0.79 03/23/2015   CREATININE 0.89 12/23/2014   CREATININE 0.8 04/30/2014    Lab Results  Component Value Date   GLUCOSE 190* 03/23/2015   CHOL 253* 03/23/2015   TRIG 315.0* 03/23/2015   HDL 38.80* 03/23/2015   LDLDIRECT 169.0 03/23/2015   LDLCALC 101* 12/23/2014   ALT 18 03/23/2015   AST 22 03/23/2015   NA 139 03/23/2015   K 3.8 03/23/2015   CL 99 03/23/2015   CREATININE 0.79 03/23/2015   BUN 16 03/23/2015   CO2 29 03/23/2015   TSH 0.87 12/07/2012   HGBA1C 7.4* 03/23/2015   MICROALBUR 1.6 12/23/2014    No results found.  Assessment & Plan:   Problem List Items Addressed This Visit    Essential hypertension    Well controlled on current regimen. Renal function stable, no changes today.  Lab Results  Component Value Date   CREATININE 0.79 03/23/2015   Lab Results  Component Value Date   NA 139 03/23/2015   K 3.8 03/23/2015   CL 99 03/23/2015   CO2 29 03/23/2015         Hyperlipidemia with target LDL less than 70    LDL and triglycerides have been addressed with high potency statin and fenofibrate.  She is taking medication as directed but had a cookie binge last evening which ,may explain the elevated triglycerides today  Lab Results   Component Value Date   CHOL 253* 03/23/2015   HDL 38.80* 03/23/2015   LDLCALC 101* 12/23/2014   LDLDIRECT 169.0 03/23/2015   TRIG 315.0* 03/23/2015   CHOLHDL 7 03/23/2015   Lab Results  Component Value Date   ALT 18 03/23/2015   AST 22 03/23/2015   ALKPHOS 70 03/23/2015   BILITOT 0.3 03/23/2015             Atherosclerosis of native artery of extremity with intermittent claudication (HCC)    Symptom of thigh and buttock pain with exercise concerning for iliac artery stenosis.  Referral to [facility] for testing .       Other Visit Diagnoses    Need for prophylactic vaccination against Streptococcus pneumoniae (pneumococcus)    -  Primary    Relevant Orders    Pneumococcal conjugate vaccine 13-valent (Completed)    Encounter for immunization        Atherosclerotic peripheral vascular disease with intermittent claudication (  HCC)        Relevant Orders    Ambulatory referral to Vascular Surgery    Hypercalcemia        Relevant Orders    Calcium, ionized    PTH, intact (no Ca)      A total of 25 minutes of face to face time was spent with patient more than half of which was spent in counselling about the above mentioned conditions  and coordination of care   I am having Ms. Stenerson maintain her glipiZIDE, fenofibrate, losartan-hydrochlorothiazide, clopidogrel, atorvastatin, metFORMIN, escitalopram, escitalopram, potassium chloride SA, and ALPRAZolam.  No orders of the defined types were placed in this encounter.    There are no discontinued medications.  Follow-up: No Follow-up on file.   Sherlene ShamsULLO, TERESA L, MD

## 2015-03-25 NOTE — Progress Notes (Signed)
Pre-visit discussion using our clinic review tool. No additional management support is needed unless otherwise documented below in the visit note.  

## 2015-03-26 ENCOUNTER — Encounter: Payer: Self-pay | Admitting: Internal Medicine

## 2015-03-28 ENCOUNTER — Encounter: Payer: Self-pay | Admitting: Internal Medicine

## 2015-03-28 NOTE — Assessment & Plan Note (Signed)
LDL and triglycerides have been addressed with high potency statin and fenofibrate.  She is taking medication as directed but had a cookie binge last evening which ,may explain the elevated triglycerides today  Lab Results  Component Value Date   CHOL 253* 03/23/2015   HDL 38.80* 03/23/2015   LDLCALC 101* 12/23/2014   LDLDIRECT 169.0 03/23/2015   TRIG 315.0* 03/23/2015   CHOLHDL 7 03/23/2015   Lab Results  Component Value Date   ALT 18 03/23/2015   AST 22 03/23/2015   ALKPHOS 70 03/23/2015   BILITOT 0.3 03/23/2015

## 2015-03-28 NOTE — Assessment & Plan Note (Addendum)
Improving on metformin 850 mg bid  And glipizide 2.5 mg bid, . She has no proteinuria .   Diabetic eye exam advised,. Foot exam normal .   Lab Results  Component Value Date   HGBA1C 7.4* 03/23/2015     Lab Results  Component Value Date   MICROALBUR 1.6 12/23/2014

## 2015-03-28 NOTE — Assessment & Plan Note (Signed)
Symptom of thigh and buttock pain with exercise concerning for iliac artery stenosis.  Referral to [facility] for testing .

## 2015-03-28 NOTE — Assessment & Plan Note (Signed)
Well controlled on current regimen. Renal function stable, no changes today.  Lab Results  Component Value Date   CREATININE 0.79 03/23/2015   Lab Results  Component Value Date   NA 139 03/23/2015   K 3.8 03/23/2015   CL 99 03/23/2015   CO2 29 03/23/2015

## 2015-04-01 ENCOUNTER — Other Ambulatory Visit: Payer: 59

## 2015-04-06 ENCOUNTER — Other Ambulatory Visit: Payer: Self-pay | Admitting: Internal Medicine

## 2015-04-19 ENCOUNTER — Other Ambulatory Visit: Payer: Self-pay | Admitting: Internal Medicine

## 2015-04-24 NOTE — Telephone Encounter (Signed)
Mailed unread message to patient.  

## 2015-05-08 ENCOUNTER — Other Ambulatory Visit: Payer: Self-pay | Admitting: Internal Medicine

## 2015-06-02 ENCOUNTER — Ambulatory Visit (INDEPENDENT_AMBULATORY_CARE_PROVIDER_SITE_OTHER): Payer: Managed Care, Other (non HMO) | Admitting: Family Medicine

## 2015-06-02 ENCOUNTER — Encounter: Payer: Self-pay | Admitting: Family Medicine

## 2015-06-02 VITALS — BP 136/78 | HR 89 | Temp 98.2°F | Ht 65.0 in | Wt 160.8 lb

## 2015-06-02 DIAGNOSIS — J209 Acute bronchitis, unspecified: Secondary | ICD-10-CM | POA: Diagnosis not present

## 2015-06-02 MED ORDER — HYDROCOD POLST-CPM POLST ER 10-8 MG/5ML PO SUER: 5.0000 mL | Freq: Two times a day (BID) | ORAL | Status: DC | PRN

## 2015-06-02 MED ORDER — PREDNISONE 50 MG PO TABS: ORAL_TABLET | ORAL | Status: DC

## 2015-06-02 NOTE — Assessment & Plan Note (Signed)
New problem. Treating with Tussionex and prednisone. No indication for antibiotic at this time.

## 2015-06-02 NOTE — Patient Instructions (Signed)
Take the prednisone as prescribed.  Use the cough medication as directed (start with it at night).  Follow up if you fail to improve or worsen.  Take care  Dr. Adriana Simasook   Acute Bronchitis Bronchitis is inflammation of the airways that extend from the windpipe into the lungs (bronchi). The inflammation often causes mucus to develop. This leads to a cough, which is the most common symptom of bronchitis.  In acute bronchitis, the condition usually develops suddenly and goes away over time, usually in a couple weeks. Smoking, allergies, and asthma can make bronchitis worse. Repeated episodes of bronchitis may cause further lung problems.  CAUSES Acute bronchitis is most often caused by the same virus that causes a cold. The virus can spread from person to person (contagious) through coughing, sneezing, and touching contaminated objects. SIGNS AND SYMPTOMS   Cough.   Fever.   Coughing up mucus.   Body aches.   Chest congestion.   Chills.   Shortness of breath.   Sore throat.  DIAGNOSIS  Acute bronchitis is usually diagnosed through a physical exam. Your health care provider will also ask you questions about your medical history. Tests, such as chest X-rays, are sometimes done to rule out other conditions.  TREATMENT  Acute bronchitis usually goes away in a couple weeks. Oftentimes, no medical treatment is necessary. Medicines are sometimes given for relief of fever or cough. Antibiotic medicines are usually not needed but may be prescribed in certain situations. In some cases, an inhaler may be recommended to help reduce shortness of breath and control the cough. A cool mist vaporizer may also be used to help thin bronchial secretions and make it easier to clear the chest.  HOME CARE INSTRUCTIONS  Get plenty of rest.   Drink enough fluids to keep your urine clear or pale yellow (unless you have a medical condition that requires fluid restriction). Increasing fluids may help  thin your respiratory secretions (sputum) and reduce chest congestion, and it will prevent dehydration.   Take medicines only as directed by your health care provider.  If you were prescribed an antibiotic medicine, finish it all even if you start to feel better.  Avoid smoking and secondhand smoke. Exposure to cigarette smoke or irritating chemicals will make bronchitis worse. If you are a smoker, consider using nicotine gum or skin patches to help control withdrawal symptoms. Quitting smoking will help your lungs heal faster.   Reduce the chances of another bout of acute bronchitis by washing your hands frequently, avoiding people with cold symptoms, and trying not to touch your hands to your mouth, nose, or eyes.   Keep all follow-up visits as directed by your health care provider.  SEEK MEDICAL CARE IF: Your symptoms do not improve after 1 week of treatment.  SEEK IMMEDIATE MEDICAL CARE IF:  You develop an increased fever or chills.   You have chest pain.   You have severe shortness of breath.  You have bloody sputum.   You develop dehydration.  You faint or repeatedly feel like you are going to pass out.  You develop repeated vomiting.  You develop a severe headache. MAKE SURE YOU:   Understand these instructions.  Will watch your condition.  Will get help right away if you are not doing well or get worse.   This information is not intended to replace advice given to you by your health care provider. Make sure you discuss any questions you have with your health care provider.  Document Released: 06/16/2004 Document Revised: 05/30/2014 Document Reviewed: 10/30/2012 Elsevier Interactive Patient Education [employer].

## 2015-06-02 NOTE — Progress Notes (Signed)
Subjective:  Patient ID: Sharon Melton, female    DOB: 03/06/1961  Age: 55 y.o. MRN: [id number]  CC: Cough  HPI:  55 year old female with a past medical history of hypertension, CAD, and a history of tobacco abuse presents to the clinic today with complaints of cough.  Patient states that she has not felt well since his past Thursday. She's been experiencing mildly productive cough as well as sore throat. She's also been experiencing sinus congestion. She states that her primary concern is the cough at this time. She states the cough is severe and unrelenting. She's been using Robitussin with no relief. She states that it is worsening. She denies any associated fever but reports chills. No known exacerbating factors. No other complaints at this time.  Social Hx   Social History   Social History  . Marital Status: Married    Spouse Name: N/A  . Number of Children: N/A  . Years of Education: N/A   Social History Main Topics  . Smoking status: Former Smoker    Quit date: 02/21/2007  . Smokeless tobacco: Never Used  . Alcohol Use: No  . Drug Use: Yes     Comment: occasional  . Sexual Activity: Not Asked   Other Topics Concern  . None   Social History Narrative   Review of Systems  Constitutional: Positive for chills. Negative for fever.  HENT: Positive for congestion and sore throat.   Respiratory: Positive for cough. Negative for shortness of breath.    Objective:  BP 136/78 mmHg  Pulse 89  Temp(Src) 98.2 F (36.8 C) (Oral)  Ht 5\' 5"  (1.651 m)  Wt 160 lb 12.8 oz (72.938 kg)  BMI 26.76 kg/m2  SpO2 96%  BP/Weight 06/02/2015 03/25/2015 12/24/2014  Systolic BP 136 126 120  Diastolic BP 78 78 74  Wt. (Lbs) 160.8 167.5 167  BMI 26.76 27.87 27.79    Physical Exam  Constitutional: She appears well-developed. No distress.  HENT:  Head: Normocephalic and atraumatic.  Mouth/Throat: Oropharynx is clear and moist.  Normal TM's bilaterally.   Neck: Neck supple.    Cardiovascular: Normal rate and regular rhythm.   Pulmonary/Chest: Effort normal and breath sounds normal. No respiratory distress.  Lymphadenopathy:    She has no cervical adenopathy.  Neurological: She is alert.  Psychiatric: She has a normal mood and affect.  Vitals reviewed.  Lab Results  Component Value Date   GLUCOSE 190* 03/23/2015   CHOL 253* 03/23/2015   TRIG 315.0* 03/23/2015   HDL 38.80* 03/23/2015   LDLDIRECT 169.0 03/23/2015   LDLCALC 101* 12/23/2014   ALT 18 03/23/2015   AST 22 03/23/2015   NA 139 03/23/2015   K 3.8 03/23/2015   CL 99 03/23/2015   CREATININE 0.79 03/23/2015   BUN 16 03/23/2015   CO2 29 03/23/2015   TSH 0.87 12/07/2012   HGBA1C 7.4* 03/23/2015   MICROALBUR 1.6 12/23/2014    Assessment & Plan:   Problem List Items Addressed This Visit    Acute bronchitis - Primary    New problem. Treating with Tussionex and prednisone. No indication for antibiotic at this time.         Meds ordered this encounter  Medications  . chlorpheniramine-HYDROcodone (TUSSIONEX PENNKINETIC ER) 10-8 MG/5ML SUER    Sig: Take 5 mLs by mouth every 12 (twelve) hours as needed.    Dispense:  115 mL    Refill:  0  . predniSONE (DELTASONE) 50 MG tablet    Sig: 1  tablet daily x 5 days.    Dispense:  5 tablet    Refill:  0    Follow-up: PRN  Everlene Other DO [facility]

## 2015-06-02 NOTE — Progress Notes (Signed)
Pre visit review using our clinic review tool, if applicable. No additional management support is needed unless otherwise documented below in the visit note. 

## 2015-06-15 ENCOUNTER — Other Ambulatory Visit: Payer: Self-pay | Admitting: Internal Medicine

## 2015-07-01 ENCOUNTER — Ambulatory Visit (INDEPENDENT_AMBULATORY_CARE_PROVIDER_SITE_OTHER): Payer: Managed Care, Other (non HMO) | Admitting: Internal Medicine

## 2015-07-01 DIAGNOSIS — E119 Type 2 diabetes mellitus without complications: Secondary | ICD-10-CM

## 2015-07-01 NOTE — Progress Notes (Signed)
Patient failed to keep scheduled appointment and will be charged a no show fee.   

## 2015-07-04 NOTE — Assessment & Plan Note (Signed)
Patient failed to keep scheduled appointment and will be charged a no show fee.   

## 2015-07-16 ENCOUNTER — Other Ambulatory Visit: Payer: Self-pay

## 2015-07-16 ENCOUNTER — Other Ambulatory Visit: Payer: Self-pay | Admitting: Internal Medicine

## 2015-07-16 MED ORDER — ALPRAZOLAM 0.5 MG PO TABS: ORAL_TABLET | ORAL | Status: DC

## 2015-07-16 MED ORDER — LOSARTAN POTASSIUM-HCTZ 100-25 MG PO TABS: 1.0000 | ORAL_TABLET | Freq: Every day | ORAL | Status: DC

## 2015-07-17 ENCOUNTER — Other Ambulatory Visit: Payer: Self-pay

## 2015-07-17 NOTE — Telephone Encounter (Signed)
Refill faxed

## 2015-07-17 NOTE — Telephone Encounter (Signed)
Medication filled to pharmacy as requested.   

## 2015-07-18 ENCOUNTER — Other Ambulatory Visit: Payer: Self-pay | Admitting: Internal Medicine

## 2015-07-19 MED ORDER — ATORVASTATIN CALCIUM 40 MG PO TABS: 40.0000 mg | ORAL_TABLET | Freq: Every day | ORAL | Status: DC

## 2015-09-15 ENCOUNTER — Other Ambulatory Visit: Payer: Self-pay | Admitting: Surgical

## 2015-09-15 MED ORDER — CLOPIDOGREL BISULFATE 75 MG PO TABS: 75.0000 mg | ORAL_TABLET | Freq: Every day | ORAL | Status: DC

## 2015-10-14 ENCOUNTER — Other Ambulatory Visit: Payer: Self-pay | Admitting: Internal Medicine

## 2015-10-15 NOTE — Telephone Encounter (Signed)
No OV since 10/16 and patient no showed last appointment 2/17 advise to refill plavix and lexapro.

## 2015-10-15 NOTE — Telephone Encounter (Signed)
Refill for 30 days only.  OFFICE VISIT NEEDED prior to any more refills 

## 2015-10-20 NOTE — Telephone Encounter (Signed)
Sent Mychart message.

## 2015-11-13 ENCOUNTER — Other Ambulatory Visit: Payer: Self-pay | Admitting: *Deleted

## 2015-11-13 MED ORDER — CLOPIDOGREL BISULFATE 75 MG PO TABS: 75.0000 mg | ORAL_TABLET | Freq: Every day | ORAL | Status: DC

## 2015-11-13 MED ORDER — ALPRAZOLAM 0.5 MG PO TABS: ORAL_TABLET | ORAL | Status: DC

## 2015-11-13 MED ORDER — ESCITALOPRAM OXALATE 10 MG PO TABS: 10.0000 mg | ORAL_TABLET | Freq: Every day | ORAL | Status: DC

## 2016-03-16 ENCOUNTER — Other Ambulatory Visit: Payer: Self-pay | Admitting: Internal Medicine

## 2016-03-16 NOTE — Telephone Encounter (Signed)
Last filled 02/15/16. Last seen 07/01/15. No follow up visit on file.

## 2016-03-17 ENCOUNTER — Other Ambulatory Visit: Payer: Self-pay | Admitting: Internal Medicine

## 2016-04-13 ENCOUNTER — Other Ambulatory Visit: Payer: Self-pay | Admitting: Internal Medicine

## 2016-04-21 ENCOUNTER — Other Ambulatory Visit: Payer: Self-pay | Admitting: Internal Medicine

## 2016-05-11 ENCOUNTER — Other Ambulatory Visit: Payer: Self-pay | Admitting: Internal Medicine

## 2016-05-11 NOTE — Telephone Encounter (Signed)
Okay to refill?   Appt history attached & No future appt scheduled  Department Type Appt Notes     07/01/2015 9:30 AM No Show Sherlene Shamseresa L Tullo, MD [facility] follow up/jjb Appt reminder left on voicemail 06/30/15/jjb  06/02/2015 4:00 PM Comp Tommie SamsJayce G Cook, DO [facility]30 Bad cold/cough/sore throat/rm  04/01/2015 10:30 AM No Show [facility] lab/jjb  03/25/2015 3:00 PM Comp Sherlene Shamseresa L Tullo, MD

## 2016-07-14 ENCOUNTER — Other Ambulatory Visit: Payer: Self-pay | Admitting: Internal Medicine

## 2016-07-22 ENCOUNTER — Ambulatory Visit (INDEPENDENT_AMBULATORY_CARE_PROVIDER_SITE_OTHER): Payer: Managed Care, Other (non HMO) | Admitting: Internal Medicine

## 2016-07-22 ENCOUNTER — Encounter: Payer: Self-pay | Admitting: Internal Medicine

## 2016-07-22 VITALS — BP 138/90 | HR 77 | Resp 16 | Wt 165.0 lb

## 2016-07-22 DIAGNOSIS — E785 Hyperlipidemia, unspecified: Secondary | ICD-10-CM | POA: Diagnosis not present

## 2016-07-22 DIAGNOSIS — E119 Type 2 diabetes mellitus without complications: Secondary | ICD-10-CM

## 2016-07-22 DIAGNOSIS — F411 Generalized anxiety disorder: Secondary | ICD-10-CM | POA: Diagnosis not present

## 2016-07-22 DIAGNOSIS — I10 Essential (primary) hypertension: Secondary | ICD-10-CM | POA: Diagnosis not present

## 2016-07-22 LAB — COMPREHENSIVE METABOLIC PANEL
ALT: 30 U/L (ref 0–35)
AST: 44 U/L — AB (ref 0–37)
Albumin: 4.9 g/dL (ref 3.5–5.2)
Alkaline Phosphatase: 63 U/L (ref 39–117)
BUN: 19 mg/dL (ref 6–23)
CHLORIDE: 102 meq/L (ref 96–112)
CO2: 28 meq/L (ref 19–32)
CREATININE: 0.84 mg/dL (ref 0.40–1.20)
Calcium: 10.6 mg/dL — ABNORMAL HIGH (ref 8.4–10.5)
GFR: 74.19 mL/min (ref >60.00)
GLUCOSE: 199 mg/dL — AB (ref 70–99)
Potassium: 4.1 mEq/L (ref 3.5–5.1)
SODIUM: 137 meq/L (ref 135–145)
Total Bilirubin: 0.3 mg/dL (ref 0.2–1.2)
Total Protein: 7.6 g/dL (ref 6.0–8.3)

## 2016-07-22 LAB — LIPID PANEL
Cholesterol: 237 mg/dL — ABNORMAL HIGH (ref 0–200)
HDL: 42.5 mg/dL (ref >39.00)
LDL CALC: 157 mg/dL — AB (ref 0–99)
NONHDL: 194.18
Total CHOL/HDL Ratio: 6
Triglycerides: 184 mg/dL — ABNORMAL HIGH (ref 0.0–149.0)
VLDL: 36.8 mg/dL (ref 0.0–40.0)

## 2016-07-22 LAB — POCT GLYCOSYLATED HEMOGLOBIN (HGB A1C): Hemoglobin A1C: 8.1

## 2016-07-22 LAB — MICROALBUMIN / CREATININE URINE RATIO
Creatinine,U: 89.5 mg/dL
Microalb Creat Ratio: 4.7 mg/g (ref 0.0–30.0)
Microalb, Ur: 4.2 mg/dL — ABNORMAL HIGH (ref 0.0–1.9)

## 2016-07-22 LAB — HEMOGLOBIN A1C: Hgb A1c MFr Bld: 8.6 % — ABNORMAL HIGH (ref 4.6–6.5)

## 2016-07-22 LAB — LDL CHOLESTEROL, DIRECT: Direct LDL: 164 mg/dL

## 2016-07-22 MED ORDER — ESCITALOPRAM OXALATE 20 MG PO TABS: 20.0000 mg | ORAL_TABLET | Freq: Every day | ORAL | 5 refills | Status: DC

## 2016-07-22 MED ORDER — DAPAGLIFLOZIN PROPANEDIOL 5 MG PO TABS: 5.0000 mg | ORAL_TABLET | Freq: Every day | ORAL | 5 refills | Status: DC

## 2016-07-22 MED ORDER — ALPRAZOLAM 0.5 MG PO TABS: 0.5000 mg | ORAL_TABLET | Freq: Two times a day (BID) | ORAL | 3 refills | Status: DC | PRN

## 2016-07-22 MED ORDER — METFORMIN HCL 850 MG PO TABS: 850.0000 mg | ORAL_TABLET | Freq: Two times a day (BID) | ORAL | 1 refills | Status: DC

## 2016-07-22 NOTE — Progress Notes (Signed)
Subjective:  Patient ID: Sharon Brandyheresa J Knippel, female    DOB: 04/28/1961  Age: 56 y.o. MRN: [id number]  CC: The primary encounter diagnosis was Essential hypertension. Diagnoses of Hyperlipidemia with target LDL less than 70, Type 2 diabetes mellitus with hemoglobin A1c goal of less than 7.5% (HCC), and Generalized anxiety disorder were also pertinent to this visit.  HPI Sharon Melton presents for follow up on type 2 DM , hyperlipidemia,  HTN and GAD  Last seen Feb 2017. Has been lost to follow up due to high deductible .  Last labs oct 2016  Declines flu vaccine  Lots of family stressors.  Having panic attacks during the day,  Using alprazolam but it makes her too relaxed at work to function well.  Uses it to help her sleep  Family:  Daughter and grandson living with her  Who is 619 months old.  Her daaghter was suddenly widowed, when her  58 yr old husband died suddenly. Patient's daughter has been diagnosed with ptsd  Complicated by bipolar disorder . daughter and husband are constantly in strife.   Not checking blood sugars very often.  Lab Results  Component Value Date   HGBA1C 8.1 07/22/2016     Outpatient Medications Prior to Visit  Medication Sig Dispense Refill  . clopidogrel (PLAVIX) 75 MG tablet take 1 tablet by mouth once daily 30 tablet 5  . fenofibrate (TRICOR) 145 MG tablet Take 1 tablet (145 mg total) by mouth daily. 90 tablet 1  . glipiZIDE (GLUCOTROL) 5 MG tablet TAKE 1 TABLET (5 MG TOTAL) BY MOUTH 2 (TWO) TIMES DAILY BEFORE A MEAL. 60 tablet 3  . losartan-hydrochlorothiazide (HYZAAR) 100-25 MG tablet take 1 tablet by mouth once daily 90 tablet 2  . potassium chloride SA (K-DUR,KLOR-CON) 20 MEQ tablet take 1 tablet by mouth once daily 30 tablet 2  . ALPRAZolam (XANAX) 0.5 MG tablet take 1 tablet by mouth twice a day if needed 60 tablet 3  . atorvastatin (LIPITOR) 40 MG tablet Take 1 tablet (40 mg total) by mouth daily. 90 tablet 0  . atorvastatin (LIPITOR) 40 MG  tablet take 1 tablet by mouth once daily 90 tablet 1  . chlorpheniramine-HYDROcodone (TUSSIONEX PENNKINETIC ER) 10-8 MG/5ML SUER Take 5 mLs by mouth every 12 (twelve) hours as needed. 115 mL 0  . escitalopram (LEXAPRO) 10 MG tablet Take 1 tablet (10 mg total) by mouth daily. 30 tablet 5  . metFORMIN (GLUCOPHAGE) 850 MG tablet Take 1 tablet (850 mg total) by mouth 2 (two) times daily with a meal. 180 tablet 1  . predniSONE (DELTASONE) 50 MG tablet 1 tablet daily x 5 days. 5 tablet 0   No facility-administered medications prior to visit.     Review of Systems;  Patient denies headache, fevers, malaise, unintentional weight loss, skin rash, eye pain, sinus congestion and sinus pain, sore throat, dysphagia,  hemoptysis , cough, dyspnea, wheezing, chest pain, palpitations, orthopnea, edema, abdominal pain, nausea, melena, diarrhea, constipation, flank pain, dysuria, hematuria, urinary  Frequency, nocturia, numbness, tingling, seizures,  Focal weakness, Loss of consciousness,  Tremor,  depression, and suicidal ideation.     Objective:  BP 138/90   Pulse 77   Resp 16   Wt 165 lb (74.8 kg)   SpO2 95%   BMI 27.46 kg/m   BP Readings from Last 3 Encounters:  07/22/16 138/90  06/02/15 136/78  03/25/15 126/78    Wt Readings from Last 3 Encounters:  07/22/16 165 lb (74.8 kg)  06/02/15 160 lb 12.8 oz (72.9 kg)  03/25/15 167 lb 8 oz (76 kg)    General appearance: alert, cooperative and appears stated age Ears: normal TM's and external ear canals both ears Throat: lips, mucosa, and tongue normal; teeth and gums normal Neck: no adenopathy, no carotid bruit, supple, symmetrical, trachea midline and thyroid not enlarged, symmetric, no tenderness/mass/nodules Back: symmetric, no curvature. ROM normal. No CVA tenderness. Lungs: clear to auscultation bilaterally Heart: regular rate and rhythm, S1, S2 normal, no murmur, click, rub or gallop Abdomen: soft, non-tender; bowel sounds normal; no masses,   no organomegaly Pulses: 2+ and symmetric Skin: Skin color, texture, turgor normal. No rashes or lesions Lymph nodes: Cervical, supraclavicular, and axillary nodes normal.  Lab Results  Component Value Date   HGBA1C 8.1 07/22/2016   HGBA1C 8.6 (H) 07/22/2016   HGBA1C 7.4 (H) 03/23/2015    Lab Results  Component Value Date   CREATININE 0.84 07/22/2016   CREATININE 0.79 03/23/2015   CREATININE 0.89 12/23/2014    Lab Results  Component Value Date   GLUCOSE 199 (H) 07/22/2016   CHOL 237 (H) 07/22/2016   TRIG 184.0 (H) 07/22/2016   HDL 42.50 07/22/2016   LDLDIRECT 164.0 07/22/2016   LDLCALC 157 (H) 07/22/2016   ALT 30 07/22/2016   AST 44 (H) 07/22/2016   NA 137 07/22/2016   K 4.1 07/22/2016   CL 102 07/22/2016   CREATININE 0.84 07/22/2016   BUN 19 07/22/2016   CO2 28 07/22/2016   TSH 0.87 12/07/2012   HGBA1C 8.1 07/22/2016   MICROALBUR 4.2 (H) 07/22/2016    No results found.  Assessment & Plan:   Problem List Items Addressed This Visit    Essential hypertension - Primary    Above goal on current medications. losartan dose is maximal.  Will add metoprolol xl       Relevant Medications   atorvastatin (LIPITOR) 80 MG tablet   metoprolol succinate (TOPROL-XL) 25 MG 24 hr tablet   Other Relevant Orders   Comprehensive metabolic panel (Completed)   Microalbumin / creatinine urine ratio (Completed)   Generalized anxiety disorder    Uncontrolled,  With frequent panic attacks.  lexapro dose increased to 20 mg daily ,  daytie alprazolam dose reduced to 1/2 tablet for panic attacks,  Full tablet for insomnia.       Hyperlipidemia with target LDL less than 70    LDL and triglycerides have been addressed with high potency statin and fenofibrate.   However, her LDL is still elevated,  Will increase dose to 80 mg daily  Lab Results  Component Value Date   CHOL 237 (H) 07/22/2016   HDL 42.50 07/22/2016   LDLCALC 157 (H) 07/22/2016   LDLDIRECT 164.0 07/22/2016   TRIG  184.0 (H) 07/22/2016   CHOLHDL 6 07/22/2016   Lab Results  Component Value Date   ALT 30 07/22/2016   AST 44 (H) 07/22/2016   ALKPHOS 63 07/22/2016   BILITOT 0.3 07/22/2016             Relevant Medications   atorvastatin (LIPITOR) 80 MG tablet   metoprolol succinate (TOPROL-XL) 25 MG 24 hr tablet   Other Relevant Orders   LDL cholesterol, direct (Completed)   Lipid panel (Completed)   Type 2 diabetes mellitus with hemoglobin A1c goal of less than 7.5% (HCC)    Uncontrolled,  POCT vs serum a1c:  8.1 vs 8.6   Will add Farxiga since other meds are at maximal doses.  Lab Results  Component Value Date   HGBA1C 8.1 07/22/2016    Lab Results  Component Value Date   MICROALBUR 4.2 (H) 07/22/2016         Relevant Medications   metFORMIN (GLUCOPHAGE) 850 MG tablet   dapagliflozin propanediol (FARXIGA) 5 MG TABS tablet   atorvastatin (LIPITOR) 80 MG tablet   Other Relevant Orders   Hemoglobin A1c (Completed)   POCT glycosylated hemoglobin (Hb A1C) (Completed)     A total of 40 minutes was spent with patient more than half of which was spent in counseling patient on the above mentioned issues , reviewing and explaining recent labs and imaging studies done, and coordination of care.  I have discontinued Ms. Abid's metoprolol succinate, chlorpheniramine-HYDROcodone, predniSONE, atorvastatin, and atorvastatin. I have also changed her ALPRAZolam and escitalopram. Additionally, I am having her start on dapagliflozin propanediol, atorvastatin, and metoprolol succinate. Lastly, I am having her maintain her glipiZIDE, fenofibrate, potassium chloride SA, losartan-hydrochlorothiazide, clopidogrel, and metFORMIN.  Meds ordered this encounter  Medications  . ALPRAZolam (XANAX) 0.5 MG tablet    Sig: Take 1 tablet (0.5 mg total) by mouth 2 (two) times daily as needed for anxiety.    Dispense:  60 tablet    Refill:  3  . escitalopram (LEXAPRO) 20 MG tablet    Sig: Take 1 tablet (20  mg total) by mouth daily.    Dispense:  30 tablet    Refill:  5  . metFORMIN (GLUCOPHAGE) 850 MG tablet    Sig: Take 1 tablet (850 mg total) by mouth 2 (two) times daily with a meal.    Dispense:  180 tablet    Refill:  1  . dapagliflozin propanediol (FARXIGA) 5 MG TABS tablet    Sig: Take 5 mg by mouth daily.    Dispense:  30 tablet    Refill:  5  . atorvastatin (LIPITOR) 80 MG tablet    Sig: Take 1 tablet (80 mg total) by mouth daily.    Dispense:  90 tablet    Refill:  3  . metoprolol succinate (TOPROL-XL) 25 MG 24 hr tablet    Sig: Take 1 tablet (25 mg total) by mouth daily.    Dispense:  90 tablet    Refill:  3    Medications Discontinued During This Encounter  Medication Reason  . atorvastatin (LIPITOR) 40 MG tablet Duplicate  . predniSONE (DELTASONE) 50 MG tablet Completed Course  . chlorpheniramine-HYDROcodone (TUSSIONEX PENNKINETIC ER) 10-8 MG/5ML SUER Patient has not taken in last 30 days  . ALPRAZolam (XANAX) 0.5 MG tablet Reorder  . escitalopram (LEXAPRO) 10 MG tablet Reorder  . metFORMIN (GLUCOPHAGE) 850 MG tablet Reorder  . atorvastatin (LIPITOR) 40 MG tablet   . metoprolol (TOPROL XL) 100 MG 24 hr tablet Expired Prescription    Follow-up: Return in about 3 months (around 10/22/2016) for follow up diabetes.   Sherlene Shams, MD

## 2016-07-22 NOTE — Patient Instructions (Addendum)
Increase the lexapro to 20 mg daily  Reduce the xanax dose  To 1/2 tablet for daytime panic attacks.  You can take 1.5 pills at bedtime    Please check your blood sugars twice daily:  1) fasting and 2) 2 hours after a meal (you can vary from day to day which meal you check)  Send me your blood sugars in 2 weeks through Mychart or fax/call the office. If your sugars are still too high we will need to change your medications or add additional medications depending on your blood sugars.     starting you on a new medication Marcelline DeistFarxiga (if it is affordable)  One tablet daily .   Dapagliflozin tablets What is this medicine? DAPAGLIFLOZIN (DAP a gli FLOE zin) helps to treat type 2 diabetes. It helps to control blood sugar. Treatment is combined with diet and exercise. This medicine may be used for other purposes; ask your health care provider or pharmacist if you have questions. COMMON BRAND NAME(S): Marcelline DeistFarxiga What should I tell my health care provider before I take this medicine? They need to know if you have any of these conditions: -bladder cancer -dehydration -diabetic ketoacidosis -diet low in salt -eating less due to illness, surgery, dieting, or any other reason -having surgery -high cholesterol -history of pancreatitis or pancreas problems -history of yeast infection of the penis or vagina -if you often drink alcohol -infections in the bladder, kidneys, or urinary tract -kidney disease -low blood pressure -on hemodialysis -problems urinating -type 1 diabetes -uncircumcised female -an unusual or allergic reaction to dapagliflozin, other medicines, foods, dyes, or preservatives -pregnant or trying to get pregnant -breast-feeding How should I use this medicine? Take this medicine by mouth with a glass of water. Follow the directions on the prescription label. You can take it with or without food. If it upsets your stomach, take it with food. Take this medicine in the morning. Take  your dose at the same time each day. Do not take more often than directed. Do not stop taking except on your doctor's advice. A special MedGuide will be given to you by the pharmacist with each prescription and refill. Be sure to read this information carefully each time. Talk to your pediatrician regarding the use of this medicine in children. Special care may be needed. Overdosage: If you think you have taken too much of this medicine contact a poison control center or emergency room at once. NOTE: This medicine is only for you. Do not share this medicine with others. What if I miss a dose? If you miss a dose, take it as soon as you can. If it is almost time for your next dose, take only that dose. Do not take double or extra doses. What may interact with this medicine? Do not take this medicine with any of the following medications: -gatifloxacin This medicine may also interact with the following medications: -alcohol -certain medicines for blood pressure, heart disease -diuretics -insulin -nateglinide -pioglitazone -quinolone antibiotics like ciprofloxacin, levofloxacin, ofloxacin -repaglinide -some herbal dietary supplements -steroid medicines like prednisone or cortisone -sulfonylureas like glimepiride, glipizide, glyburide -thyroid medicine This list may not describe all possible interactions. Give your health care provider a list of all the medicines, herbs, non-prescription drugs, or dietary supplements you use. Also tell them if you smoke, drink alcohol, or use illegal drugs. Some items may interact with your medicine. What should I watch for while using this medicine? Visit your doctor or health care professional for regular checks on  your progress. This medicine can cause a serious condition in which there is too much acid in the blood. If you develop nausea, vomiting, stomach pain, unusual tiredness, or breathing problems, stop taking this medicine and call your doctor right  away. If possible, use a ketone dipstick to check for ketones in your urine. A test called the HbA1C (A1C) will be monitored. This is a simple blood test. It measures your blood sugar control over the last 2 to 3 months. You will receive this test every 3 to 6 months. Learn how to check your blood sugar. Learn the symptoms of low and high blood sugar and how to manage them. Always carry a quick-source of sugar with you in case you have symptoms of low blood sugar. Examples include hard sugar candy or glucose tablets. Make sure others know that you can choke if you eat or drink when you develop serious symptoms of low blood sugar, such as seizures or unconsciousness. They must get medical help at once. Tell your doctor or health care professional if you have high blood sugar. You might need to change the dose of your medicine. If you are sick or exercising more than usual, you might need to change the dose of your medicine. Do not skip meals. Ask your doctor or health care professional if you should avoid alcohol. Many nonprescription cough and cold products contain sugar or alcohol. These can affect blood sugar. Wear a medical ID bracelet or chain, and carry a card that describes your disease and details of your medicine and dosage times. What side effects may I notice from receiving this medicine? Side effects that you should report to your doctor or health care professional as soon as possible: -allergic reactions like skin rash, itching or hives, swelling of the face, lips, or tongue -breathing problems -dizziness -feeling faint or lightheaded, falls -muscle weakness -nausea, vomiting, unusual stomach upset or pain -signs and symptoms of low blood sugar such as feeling anxious, confusion, dizziness, increased hunger, unusually weak or tired, sweating, shakiness, cold, irritable, headache, blurred vision, fast heartbeat, loss of consciousness -signs and symptoms of a urinary tract infection, such  as fever, chills, a burning feeling when urinating, blood in the urine, back pain -trouble passing urine or change in the amount of urine, including an urgent need to urinate more often, in larger amounts, or at night -penile discharge, itching, or pain in men -unusual tiredness -vaginal discharge, itching, or odor in women Side effects that usually do not require medical attention (report to your doctor or health care professional if they continue or are bothersome): -constipation -mild increase in urination -stuffy or runny nose -sore throat -thirsty This list may not describe all possible side effects. Call your doctor for medical advice about side effects. You may report side effects to FDA at 1-800-FDA-1088. Where should I keep my medicine? Keep out of the reach of children. Store at room temperature between 15 and 30 degrees C (59 and 86 degrees F). Throw away any unused medicine after the expiration date. NOTE: This sheet is a summary. It may not cover all possible information. If you have questions about this medicine, talk to your doctor, pharmacist, or health care provider.  2018 [employer]vier/Gold Standard (2015-06-11 08:46:40)

## 2016-07-22 NOTE — Progress Notes (Signed)
Pre visit review using our clinic review tool, if applicable. No additional management support is needed unless otherwise documented below in the visit note. 

## 2016-07-24 ENCOUNTER — Encounter: Payer: Self-pay | Admitting: Internal Medicine

## 2016-07-24 MED ORDER — ATORVASTATIN CALCIUM 80 MG PO TABS: 80.0000 mg | ORAL_TABLET | Freq: Every day | ORAL | 3 refills | Status: DC

## 2016-07-24 MED ORDER — METOPROLOL SUCCINATE ER 25 MG PO TB24: 25.0000 mg | ORAL_TABLET | Freq: Every day | ORAL | 3 refills | Status: DC

## 2016-07-24 NOTE — Assessment & Plan Note (Addendum)
LDL and triglycerides have been addressed with high potency statin and fenofibrate.   However, her LDL is still elevated,  Will increase dose to 80 mg daily  Lab Results  Component Value Date   CHOL 237 (H) 07/22/2016   HDL 42.50 07/22/2016   LDLCALC 157 (H) 07/22/2016   LDLDIRECT 164.0 07/22/2016   TRIG 184.0 (H) 07/22/2016   CHOLHDL 6 07/22/2016   Lab Results  Component Value Date   ALT 30 07/22/2016   AST 44 (H) 07/22/2016   ALKPHOS 63 07/22/2016   BILITOT 0.3 07/22/2016

## 2016-07-24 NOTE — Assessment & Plan Note (Signed)
Uncontrolled,  With frequent panic attacks.  lexapro dose increased to 20 mg daily ,  daytie alprazolam dose reduced to 1/2 tablet for panic attacks,  Full tablet for insomnia.

## 2016-07-24 NOTE — Assessment & Plan Note (Signed)
Above goal on current medications. losartan dose is maximal.  Will add metoprolol xl

## 2016-07-24 NOTE — Assessment & Plan Note (Addendum)
Uncontrolled,  POCT vs serum a1c:  8.1 vs 8.6   Will add Farxiga since other meds are at maximal doses.   Lab Results  Component Value Date   HGBA1C 8.1 07/22/2016    Lab Results  Component Value Date   MICROALBUR 4.2 (H) 07/22/2016

## 2016-08-08 NOTE — Telephone Encounter (Signed)
Mailed unread message to patient.  

## 2016-10-18 ENCOUNTER — Other Ambulatory Visit: Payer: Self-pay | Admitting: Internal Medicine

## 2016-10-18 ENCOUNTER — Other Ambulatory Visit: Payer: Self-pay

## 2016-10-18 MED ORDER — ATORVASTATIN CALCIUM 80 MG PO TABS: 80.0000 mg | ORAL_TABLET | Freq: Every day | ORAL | 3 refills | Status: DC

## 2016-10-25 ENCOUNTER — Ambulatory Visit: Payer: Managed Care, Other (non HMO) | Admitting: Internal Medicine

## 2016-11-17 ENCOUNTER — Other Ambulatory Visit: Payer: Self-pay | Admitting: Internal Medicine

## 2016-11-20 ENCOUNTER — Other Ambulatory Visit: Payer: Self-pay | Admitting: Internal Medicine

## 2016-12-14 ENCOUNTER — Telehealth: Payer: Self-pay | Admitting: Internal Medicine

## 2016-12-14 ENCOUNTER — Ambulatory Visit: Payer: Managed Care, Other (non HMO) | Admitting: Internal Medicine

## 2016-12-14 NOTE — Telephone Encounter (Signed)
If the slot cannot be filled she will be charged the fee

## 2016-12-14 NOTE — Telephone Encounter (Signed)
Pt called and cancelled her appt because she has to babysit her grandchildren.

## 2016-12-14 NOTE — Telephone Encounter (Signed)
Per Dr. Darrick Huntsmanullo charge No Show Fee

## 2016-12-14 NOTE — Telephone Encounter (Signed)
The spot was not filled.

## 2016-12-14 NOTE — Telephone Encounter (Signed)
FYI

## 2017-01-12 ENCOUNTER — Other Ambulatory Visit: Payer: Self-pay | Admitting: Internal Medicine

## 2017-02-13 ENCOUNTER — Telehealth: Payer: Self-pay | Admitting: *Deleted

## 2017-03-20 ENCOUNTER — Other Ambulatory Visit: Payer: Self-pay | Admitting: Internal Medicine

## 2017-04-10 ENCOUNTER — Ambulatory Visit (INDEPENDENT_AMBULATORY_CARE_PROVIDER_SITE_OTHER): Payer: Managed Care, Other (non HMO) | Admitting: Internal Medicine

## 2017-04-10 ENCOUNTER — Encounter: Payer: Self-pay | Admitting: Internal Medicine

## 2017-04-10 VITALS — BP 168/90 | HR 67 | Temp 97.9°F | Resp 14 | Ht 65.0 in | Wt 158.4 lb

## 2017-04-10 DIAGNOSIS — E119 Type 2 diabetes mellitus without complications: Secondary | ICD-10-CM | POA: Diagnosis not present

## 2017-04-10 DIAGNOSIS — Z1231 Encounter for screening mammogram for malignant neoplasm of breast: Secondary | ICD-10-CM | POA: Diagnosis not present

## 2017-04-10 DIAGNOSIS — I10 Essential (primary) hypertension: Secondary | ICD-10-CM

## 2017-04-10 DIAGNOSIS — F411 Generalized anxiety disorder: Secondary | ICD-10-CM | POA: Diagnosis not present

## 2017-04-10 DIAGNOSIS — Z79899 Other long term (current) drug therapy: Secondary | ICD-10-CM

## 2017-04-10 DIAGNOSIS — E1121 Type 2 diabetes mellitus with diabetic nephropathy: Secondary | ICD-10-CM | POA: Diagnosis not present

## 2017-04-10 DIAGNOSIS — Z1239 Encounter for other screening for malignant neoplasm of breast: Secondary | ICD-10-CM

## 2017-04-10 LAB — POCT GLYCOSYLATED HEMOGLOBIN (HGB A1C): HEMOGLOBIN A1C: 7.6

## 2017-04-10 MED ORDER — ALPRAZOLAM 0.5 MG PO TABS: ORAL_TABLET | ORAL | 3 refills | Status: DC

## 2017-04-10 MED ORDER — PAROXETINE HCL 10 MG PO TABS: 10.0000 mg | ORAL_TABLET | Freq: Every day | ORAL | 1 refills | Status: DC

## 2017-04-10 NOTE — Progress Notes (Signed)
76

## 2017-04-10 NOTE — Progress Notes (Signed)
Subjective:  Patient ID: Sharon Melton, female    DOB: 06/01/1960  Age: 56 y.o. MRN: [id number]  CC: The primary encounter diagnosis was Screening breast examination. Diagnoses of Type 2 diabetes mellitus with hemoglobin A1c goal of less than 7.5% (HCC), Long-term use of high-risk medication, Generalized anxiety disorder, Diabetic nephropathy associated with type 2 diabetes mellitus (HCC), and Essential hypertension were also pertinent to this visit.  HPI Sharon Melton presents for FOLLOW UP ON MULTIPLE ISSUES including uncontrolled type 2 DM, hypertension hyperlipidenia,  And depression with GAD. Patient was last seen in march , at which time her anxiety was uncontrolled .  Her lexapro was increased to 20 mg daily and her alprazolam was reduced.    She states that she has stopped taking the Lexapro due to a preference for feeling some emotions rather than  "feeling nothing."  However, she has a significantly Positive depression screen today and is tearful in the office,  She cites the constant daily unresolved conflict between her husband and her newly widowed , unemployed bipolar daughter as the cause.  She has had fleeting thoughts of driving her car into a tree on the way to work , but assures me that she is not contemplating suicide because her 58 year old grandson is her "life joy." Her daughter has been   .uncontrolled DM:  TAKING METFORMIN AND GLIPIZIDE  unintentional weight loss due to uncontrolled anxiety causing anorexia.    Outpatient Medications Prior to Visit  Medication Sig Dispense Refill  . atorvastatin (LIPITOR) 80 MG tablet Take 1 tablet (80 mg total) by mouth daily. 90 tablet 3  . clopidogrel (PLAVIX) 75 MG tablet take 1 tablet by mouth once daily 30 tablet 5  . fenofibrate (TRICOR) 145 MG tablet Take 1 tablet (145 mg total) by mouth daily. 90 tablet 1  . glipiZIDE (GLUCOTROL) 5 MG tablet TAKE 1 TABLET (5 MG TOTAL) BY MOUTH 2 (TWO) TIMES DAILY BEFORE A MEAL. 60 tablet 3    . losartan-hydrochlorothiazide (HYZAAR) 100-25 MG tablet take 1 tablet by mouth once daily 90 tablet 2  . metFORMIN (GLUCOPHAGE) 850 MG tablet take 1 tablet by mouth twice a day WITH A MEAL 180 tablet 1  . metoprolol succinate (TOPROL-XL) 25 MG 24 hr tablet Take 1 tablet (25 mg total) by mouth daily. 90 tablet 3  . potassium chloride SA (K-DUR,KLOR-CON) 20 MEQ tablet take 1 tablet by mouth once daily 30 tablet 2  . ALPRAZolam (XANAX) 0.5 MG tablet take 1 tablet by mouth twice a day if needed for anxiety 60 tablet 3  . dapagliflozin propanediol (FARXIGA) 5 MG TABS tablet Take 5 mg by mouth daily. 30 tablet 5  . escitalopram (LEXAPRO) 20 MG tablet Take 1 tablet (20 mg total) by mouth daily. (Patient not taking: Reported on 04/10/2017) 30 tablet 5   No facility-administered medications prior to visit.     Review of Systems;  Patient denies headache, fevers, malaise, unintentional weight loss, skin rash, eye pain, sinus congestion and sinus pain, sore throat, dysphagia,  hemoptysis , cough, dyspnea, wheezing, chest pain, palpitations, orthopnea, edema, abdominal pain, nausea, melena, diarrhea, constipation, flank pain, dysuria, hematuria, urinary  Frequency, nocturia, numbness, tingling, seizures,  Focal weakness, Loss of consciousness,  Tremor, insomnia, depression, anxiety, and suicidal ideation.      Objective:  BP (!) 168/90 (BP Location: Left Arm, Patient Position: Sitting, Cuff Size: Normal)   Pulse 67   Temp 97.9 F (36.6 C) (Oral)   Resp  14   Ht 5\' 5"  (1.651 m)   Wt 158 lb 6.4 oz (71.8 kg)   SpO2 97%   BMI 26.36 kg/m   BP Readings from Last 3 Encounters:  04/10/17 (!) 168/90  07/22/16 138/90  06/02/15 136/78    Wt Readings from Last 3 Encounters:  04/10/17 158 lb 6.4 oz (71.8 kg)  07/22/16 165 lb (74.8 kg)  06/02/15 160 lb 12.8 oz (72.9 kg)    General appearance: alert, cooperative and appears stated age Ears: normal TM's and external ear canals both ears Throat:  lips, mucosa, and tongue normal; teeth and gums normal Neck: no adenopathy, no carotid bruit, supple, symmetrical, trachea midline and thyroid not enlarged, symmetric, no tenderness/mass/nodules Back: symmetric, no curvature. ROM normal. No CVA tenderness. Lungs: clear to auscultation bilaterally Heart: regular rate and rhythm, S1, S2 normal, no murmur, click, rub or gallop Abdomen: soft, non-tender; bowel sounds normal; no masses,  no organomegaly Pulses: 2+ and symmetric Skin: Skin color, texture, turgor normal. No rashes or lesions Lymph nodes: Cervical, supraclavicular, and axillary nodes normal.  Lab Results  Component Value Date   HGBA1C 7.6 04/10/2017   HGBA1C 8.1 07/22/2016   HGBA1C 8.6 (H) 07/22/2016    Lab Results  Component Value Date   CREATININE 0.84 04/10/2017   CREATININE 0.84 07/22/2016   CREATININE 0.79 03/23/2015    Lab Results  Component Value Date   GLUCOSE 123 (H) 04/10/2017   CHOL 237 (H) 07/22/2016   TRIG 184.0 (H) 07/22/2016   HDL 42.50 07/22/2016   LDLDIRECT 164.0 07/22/2016   LDLCALC 157 (H) 07/22/2016   ALT 16 04/10/2017   AST 25 04/10/2017   NA 137 04/10/2017   K 3.8 04/10/2017   CL 99 04/10/2017   CREATININE 0.84 04/10/2017   BUN 21 04/10/2017   CO2 29 04/10/2017   TSH 0.87 12/07/2012   HGBA1C 7.6 04/10/2017   MICROALBUR 4.2 (H) 07/22/2016    No results found.  Assessment & Plan:   Problem List Items Addressed This Visit    Diabetic nephropathy associated with type 2 diabetes mellitus (HCC)    Continue losartan      Essential hypertension    Not well controlled on current regimen. Will increase metoprolol dose to 50 mg daily if home readings are  > 130      Generalized anxiety disorder    Adding paxil withdose titration in 2 weeks.  Alprazolam refilled.       Relevant Medications   ALPRAZolam (XANAX) 0.5 MG tablet   PARoxetine (PAXIL) 10 MG tablet   Type 2 diabetes mellitus with hemoglobin A1c goal of less than 7.5% (HCC)     imporved control with metformin and glipizide.  No changes today.  Lab Results  Component Value Date   HGBA1C 7.6 04/10/2017   Lab Results  Component Value Date   MICROALBUR 4.2 (H) 07/22/2016         Relevant Orders   POCT HgB A1C (Completed)    Other Visit Diagnoses    Screening breast examination    -  Primary   Relevant Orders   MM SCREENING BREAST TOMO BILATERAL   Long-term use of high-risk medication       Relevant Orders   Comprehensive metabolic panel (Completed)      I have discontinued Rhea Pinkheresa J. Aubuchon's escitalopram and dapagliflozin propanediol. I am also having her start on PARoxetine. Additionally, I am having her maintain her glipiZIDE, fenofibrate, potassium chloride SA, metoprolol succinate, atorvastatin, clopidogrel, losartan-hydrochlorothiazide,  metFORMIN, and ALPRAZolam.  Meds ordered this encounter  Medications  . ALPRAZolam (XANAX) 0.5 MG tablet    Sig: take 1 tablet by mouth twice a day if needed for anxiety    Dispense:  60 tablet    Refill:  3    KEEP ON FILE FOR FUTURE REFILLS  . PARoxetine (PAXIL) 10 MG tablet    Sig: Take 1 tablet (10 mg total) daily by mouth.    Dispense:  90 tablet    Refill:  1   A total of 25 minutes of face to face time was spent with patient more than half of which was spent in counselling about the above mentioned conditions  and coordination of care  Medications Discontinued During This Encounter  Medication Reason  . dapagliflozin propanediol (FARXIGA) 5 MG TABS tablet Patient has not taken in last 30 days  . escitalopram (LEXAPRO) 20 MG tablet Patient has not taken in last 30 days  . ALPRAZolam (XANAX) 0.5 MG tablet     Follow-up: No Follow-up on file.   Sherlene Shams, MD

## 2017-04-10 NOTE — Patient Instructions (Addendum)
Your blood pressure today is very high.  If your check it over  the next 2-3 weeks and it is still over 140/80,  Increase the dose of the metoprolol  to 50 mg daily  .  We are resuming a medication for your anxiety called Paxil (paroxetine) start it in the evening at 10 mg daily. After two weeks you can  increase it to 20 mg daily if your general mood is no better  Use your Mychart to let me know how you are doing    Dont' skip meals when you are not hungry. try drinking a premixed protein drink called Premier Protein. It is great tasting,   very low sugar and available of < $2 serving at [facility] and  In bulk for $1.50/serving at [employer] and [employer]  .    Nutritional analysis :  160 cal  30 g protein  1 g sugar 50% calcium needs

## 2017-04-11 ENCOUNTER — Encounter: Payer: Self-pay | Admitting: Internal Medicine

## 2017-04-11 DIAGNOSIS — E785 Hyperlipidemia, unspecified: Secondary | ICD-10-CM | POA: Insufficient documentation

## 2017-04-11 DIAGNOSIS — E1169 Type 2 diabetes mellitus with other specified complication: Secondary | ICD-10-CM | POA: Insufficient documentation

## 2017-04-11 DIAGNOSIS — E1121 Type 2 diabetes mellitus with diabetic nephropathy: Secondary | ICD-10-CM | POA: Insufficient documentation

## 2017-04-11 LAB — COMPREHENSIVE METABOLIC PANEL
ALT: 16 U/L (ref 0–35)
AST: 25 U/L (ref 0–37)
Albumin: 4.7 g/dL (ref 3.5–5.2)
Alkaline Phosphatase: 52 U/L (ref 39–117)
BUN: 21 mg/dL (ref 6–23)
CALCIUM: 10.3 mg/dL (ref 8.4–10.5)
CHLORIDE: 99 meq/L (ref 96–112)
CO2: 29 mEq/L (ref 19–32)
CREATININE: 0.84 mg/dL (ref 0.40–1.20)
GFR: 74 mL/min (ref >60.00)
Glucose, Bld: 123 mg/dL — ABNORMAL HIGH (ref 70–99)
POTASSIUM: 3.8 meq/L (ref 3.5–5.1)
SODIUM: 137 meq/L (ref 135–145)
Total Bilirubin: 0.4 mg/dL (ref 0.2–1.2)
Total Protein: 7.4 g/dL (ref 6.0–8.3)

## 2017-04-11 NOTE — Assessment & Plan Note (Signed)
Adding paxil withdose titration in 2 weeks.  Alprazolam refilled.

## 2017-04-11 NOTE — Assessment & Plan Note (Signed)
Continue losartan. 

## 2017-04-11 NOTE — Assessment & Plan Note (Signed)
Not well controlled on current regimen. Will increase metoprolol dose to 50 mg daily if home readings are  > 130

## 2017-04-11 NOTE — Assessment & Plan Note (Signed)
imporved control with metformin and glipizide.  No changes today.  Lab Results  Component Value Date   HGBA1C 7.6 04/10/2017   Lab Results  Component Value Date   MICROALBUR 4.2 (H) 07/22/2016

## 2017-05-04 ENCOUNTER — Inpatient Hospital Stay: Admission: RE | Admit: 2017-05-04 | Payer: Managed Care, Other (non HMO) | Source: Ambulatory Visit

## 2017-05-16 ENCOUNTER — Other Ambulatory Visit: Payer: Self-pay | Admitting: Internal Medicine

## 2017-07-26 ENCOUNTER — Other Ambulatory Visit: Payer: Self-pay | Admitting: *Deleted

## 2017-07-26 MED ORDER — ATORVASTATIN CALCIUM 80 MG PO TABS: 80.0000 mg | ORAL_TABLET | Freq: Every day | ORAL | 1 refills | Status: DC

## 2017-08-07 ENCOUNTER — Other Ambulatory Visit: Payer: Self-pay | Admitting: Internal Medicine

## 2017-10-27 ENCOUNTER — Other Ambulatory Visit: Payer: Self-pay

## 2017-10-27 ENCOUNTER — Telehealth: Payer: Self-pay | Admitting: Internal Medicine

## 2017-10-27 MED ORDER — LOSARTAN POTASSIUM-HCTZ 100-25 MG PO TABS: 1.0000 | ORAL_TABLET | Freq: Every day | ORAL | 0 refills | Status: DC

## 2017-10-27 NOTE — Telephone Encounter (Signed)
rx sent patient will need to schedule appointment for further refills.

## 2017-10-27 NOTE — Telephone Encounter (Unsigned)
Copied from CRM [id number]. Topic: Quick Communication - See Telephone Encounter >> Oct 27, 2017  2:59 PM Floria RavelingStovall, Shana A wrote: CRM for notification. See Telephone encounter for: 10/27/17. Pt called and is needing refill on losartan-hydrochlorothiazide (HYZAAR) 100-25 MG tablet [[id number]][[id number]]  and PARoxetine (PAXIL) 10 MG tablet [[id number]][[id number]] .  She stated they were denied.  She was last seen 03/2017.  She wanted to make an appt to get refills but no appt soon with DR.  She would like to know if she can get refills or worked in to be seen soon?    Best number [phone] Pharmacy -[facility] in [place],

## 2017-11-13 ENCOUNTER — Other Ambulatory Visit: Payer: Self-pay | Admitting: Internal Medicine

## 2017-11-13 NOTE — Telephone Encounter (Signed)
Copied from CRM [phone]. Topic: Quick Communication - Rx Refill/Question >> Nov 13, 2017  4:56 PM Raquel SarnaHayes, Teresa G wrote: ALPRAZolam Prudy Feeler(XANAX) 0.5 MG tablet  PARoxetine (PAXIL) 10 MG tablet   Needing emergency refill until July 31 appt w/ Dr. Darrick Huntsmanullo.  [facility] [phone] [place] - [address] [facility] [address] [place] [id number] Phone: [phone] Fax: [phone]

## 2017-11-14 NOTE — Telephone Encounter (Signed)
Last seen on 04-10-17, next appt on 12-20-17 med was last filled on 04-10-17. Rx refill

## 2017-11-15 ENCOUNTER — Encounter: Payer: Self-pay | Admitting: Internal Medicine

## 2017-11-15 NOTE — Telephone Encounter (Signed)
Refilled: 04/10/2017 Last OV: 04/10/2017 Next OV: 12/20/2017

## 2017-11-16 MED ORDER — ALPRAZOLAM 0.5 MG PO TABS: ORAL_TABLET | ORAL | 1 refills | Status: DC

## 2017-11-16 NOTE — Telephone Encounter (Signed)
Printed, signed and faxed.  

## 2017-11-17 MED ORDER — PAROXETINE HCL 10 MG PO TABS: 10.0000 mg | ORAL_TABLET | Freq: Every day | ORAL | 1 refills | Status: DC

## 2017-12-11 ENCOUNTER — Other Ambulatory Visit: Payer: Self-pay | Admitting: Internal Medicine

## 2017-12-20 ENCOUNTER — Ambulatory Visit: Payer: No Typology Code available for payment source | Admitting: Internal Medicine

## 2017-12-20 ENCOUNTER — Encounter: Payer: Self-pay | Admitting: Internal Medicine

## 2017-12-20 VITALS — BP 120/78 | HR 67 | Temp 98.4°F | Resp 15 | Ht 65.0 in | Wt 149.4 lb

## 2017-12-20 DIAGNOSIS — Z1239 Encounter for other screening for malignant neoplasm of breast: Secondary | ICD-10-CM

## 2017-12-20 DIAGNOSIS — T502X5A Adverse effect of carbonic-anhydrase inhibitors, benzothiadiazides and other diuretics, initial encounter: Secondary | ICD-10-CM

## 2017-12-20 DIAGNOSIS — R5383 Other fatigue: Secondary | ICD-10-CM

## 2017-12-20 DIAGNOSIS — E1165 Type 2 diabetes mellitus with hyperglycemia: Secondary | ICD-10-CM

## 2017-12-20 DIAGNOSIS — Z1231 Encounter for screening mammogram for malignant neoplasm of breast: Secondary | ICD-10-CM

## 2017-12-20 DIAGNOSIS — I10 Essential (primary) hypertension: Secondary | ICD-10-CM

## 2017-12-20 DIAGNOSIS — F411 Generalized anxiety disorder: Secondary | ICD-10-CM

## 2017-12-20 DIAGNOSIS — E1121 Type 2 diabetes mellitus with diabetic nephropathy: Secondary | ICD-10-CM

## 2017-12-20 DIAGNOSIS — E559 Vitamin D deficiency, unspecified: Secondary | ICD-10-CM

## 2017-12-20 DIAGNOSIS — E876 Hypokalemia: Secondary | ICD-10-CM

## 2017-12-20 DIAGNOSIS — T148XXA Other injury of unspecified body region, initial encounter: Secondary | ICD-10-CM

## 2017-12-20 DIAGNOSIS — E785 Hyperlipidemia, unspecified: Secondary | ICD-10-CM

## 2017-12-20 DIAGNOSIS — R748 Abnormal levels of other serum enzymes: Secondary | ICD-10-CM

## 2017-12-20 DIAGNOSIS — E119 Type 2 diabetes mellitus without complications: Secondary | ICD-10-CM

## 2017-12-20 LAB — LIPID PANEL
CHOL/HDL RATIO: 3
Cholesterol: 150 mg/dL (ref 0–200)
HDL: 45.9 mg/dL (ref >39.00)
LDL CALC: 73 mg/dL (ref 0–99)
NonHDL: 103.6
TRIGLYCERIDES: 152 mg/dL — AB (ref 0.0–149.0)
VLDL: 30.4 mg/dL (ref 0.0–40.0)

## 2017-12-20 LAB — COMPREHENSIVE METABOLIC PANEL
ALT: 42 U/L — AB (ref 0–35)
AST: 87 U/L — ABNORMAL HIGH (ref 0–37)
Albumin: 4.7 g/dL (ref 3.5–5.2)
Alkaline Phosphatase: 52 U/L (ref 39–117)
BUN: 13 mg/dL (ref 6–23)
CALCIUM: 10.5 mg/dL (ref 8.4–10.5)
CHLORIDE: 97 meq/L (ref 96–112)
CO2: 34 meq/L — AB (ref 19–32)
Creatinine, Ser: 0.92 mg/dL (ref 0.40–1.20)
GFR: 66.47 mL/min (ref >60.00)
Glucose, Bld: 168 mg/dL — ABNORMAL HIGH (ref 70–99)
POTASSIUM: 3.2 meq/L — AB (ref 3.5–5.1)
Sodium: 137 mEq/L (ref 135–145)
Total Bilirubin: 0.4 mg/dL (ref 0.2–1.2)
Total Protein: 7.4 g/dL (ref 6.0–8.3)

## 2017-12-20 LAB — CBC WITH DIFFERENTIAL/PLATELET
Basophils Absolute: 0 10*3/uL (ref 0.0–0.1)
Basophils Relative: 0.8 % (ref 0.0–3.0)
EOS ABS: 0.1 10*3/uL (ref 0.0–0.7)
Eosinophils Relative: 2.4 % (ref 0.0–5.0)
HCT: 34.8 % — ABNORMAL LOW (ref 36.0–46.0)
HEMOGLOBIN: 11.4 g/dL — AB (ref 12.0–15.0)
LYMPHS PCT: 36.6 % (ref 12.0–46.0)
Lymphs Abs: 2.3 10*3/uL (ref 0.7–4.0)
MCHC: 32.9 g/dL (ref 30.0–36.0)
MCV: 75.6 fl — ABNORMAL LOW (ref 78.0–100.0)
MONO ABS: 0.5 10*3/uL (ref 0.1–1.0)
Monocytes Relative: 8.2 % (ref 3.0–12.0)
Neutro Abs: 3.3 10*3/uL (ref 1.4–7.7)
Neutrophils Relative %: 52 % (ref 43.0–77.0)
Platelets: 262 10*3/uL (ref 150.0–400.0)
RBC: 4.6 Mil/uL (ref 3.87–5.11)
RDW: 14.3 % (ref 11.5–15.5)
WBC: 6.3 10*3/uL (ref 4.0–10.5)

## 2017-12-20 LAB — POCT GLYCOSYLATED HEMOGLOBIN (HGB A1C): HEMOGLOBIN A1C: 8.2 % — AB (ref 4.0–5.6)

## 2017-12-20 LAB — MICROALBUMIN / CREATININE URINE RATIO
CREATININE, U: 87.7 mg/dL
Microalb Creat Ratio: 0.8 mg/g (ref 0.0–30.0)

## 2017-12-20 LAB — VITAMIN D 25 HYDROXY (VIT D DEFICIENCY, FRACTURES): VITD: 12.94 ng/mL — AB (ref 30.00–100.00)

## 2017-12-20 MED ORDER — AMOXICILLIN-POT CLAVULANATE 875-125 MG PO TABS: 1.0000 | ORAL_TABLET | Freq: Two times a day (BID) | ORAL | 0 refills | Status: DC

## 2017-12-20 MED ORDER — GLIPIZIDE 5 MG PO TABS: 2.5000 mg | ORAL_TABLET | Freq: Two times a day (BID) | ORAL | 3 refills | Status: DC

## 2017-12-20 MED ORDER — METOPROLOL SUCCINATE ER 25 MG PO TB24: 12.5000 mg | ORAL_TABLET | Freq: Every day | ORAL | 3 refills | Status: DC

## 2017-12-20 NOTE — Progress Notes (Addendum)
Subjective:  Patient ID: Sharon Melton, female    DOB: 08/22/1960  Age: 57 y.o. MRN: [id number]  CC: The primary encounter diagnosis was Type 2 diabetes mellitus with hemoglobin A1c goal of less than 7.5% (HCC). Diagnoses of Breast cancer screening, Fatigue, unspecified type, Vitamin D deficiency, Elevated liver enzymes, Diabetic nephropathy associated with type 2 diabetes mellitus (HCC), Generalized anxiety disorder, Essential hypertension, Hyperlipidemia with target LDL less than 70, Uncontrolled type 2 diabetes mellitus with hyperglycemia (HCC), Diuretic-induced hypokalemia, and Animal scratch were also pertinent to this visit.  HPI Sharon Melton presents for follow up on multiple issues including diabetes and depression .  Has not been seen since March 2018 secondary to loss of insurance.   Has been Taking Paxil 10 mg  Daily since last visit . States that she is feeling much better,  HAS More energy, LOST HER JOB BUT LOVES HER  new job,  WELCOMED A  New granddaughter into the world last Friday,  Family getting along better and not involving her in their disagreements    Patient has no complaints today.  Patient is following a low glycemic index diet .   Has not been checking sugars or taking glipizide,  Just metformin,.  Patient i intentionally trying to lose weight  By "cleaning up her diet."   Patient has not  had an eye exam in the last 12 months but notes no vision changes.   She checks feet regularly for signs of infection.  Patient does occasionally walk barefoot outside,  And denies an numbness tingling or burning in feet. Patient is up to date on all recommended vaccinations  Outpatient Medications Prior to Visit  Medication Sig Dispense Refill  . ALPRAZolam (XANAX) 0.5 MG tablet take 1 tablet by mouth twice a day if needed for anxiety 60 tablet 1  . atorvastatin (LIPITOR) 80 MG tablet TAKE 1 TABLET(80 MG) BY MOUTH DAILY 90 tablet 1  . clopidogrel (PLAVIX) 75 MG tablet take 1  tablet by mouth once daily 30 tablet 5  . losartan-hydrochlorothiazide (HYZAAR) 100-25 MG tablet Take 1 tablet by mouth daily. 90 tablet 0  . metFORMIN (GLUCOPHAGE) 850 MG tablet TAKE 1 TABLET BY MOUTH TWICE A DAY WITH MEALS 180 tablet 1  . PARoxetine (PAXIL) 10 MG tablet Take 1 tablet (10 mg total) by mouth daily. 90 tablet 1  . glipiZIDE (GLUCOTROL) 5 MG tablet TAKE 1 TABLET (5 MG TOTAL) BY MOUTH 2 (TWO) TIMES DAILY BEFORE A MEAL. (Patient not taking: Reported on 12/20/2017) 60 tablet 3  . metoprolol succinate (TOPROL-XL) 25 MG 24 hr tablet Take 1 tablet (25 mg total) by mouth daily. (Patient not taking: Reported on 12/20/2017) 90 tablet 3  . potassium chloride SA (K-DUR,KLOR-CON) 20 MEQ tablet take 1 tablet by mouth once daily (Patient not taking: Reported on 12/20/2017) 30 tablet 2   No facility-administered medications prior to visit.     Review of Systems;  Patient denies headache, fevers, malaise, unintentional weight loss, skin rash, eye pain, sinus congestion and sinus pain, sore throat, dysphagia,  hemoptysis , cough, dyspnea, wheezing, chest pain, palpitations, orthopnea, edema, abdominal pain, nausea, melena, diarrhea, constipation, flank pain, dysuria, hematuria, urinary  Frequency, nocturia, numbness, tingling, seizures,  Focal weakness, Loss of consciousness,  Tremor, insomnia, depression, anxiety, and suicidal ideation.      Objective:  BP 120/78 (BP Location: Left Arm, Patient Position: Sitting, Cuff Size: Normal)   Pulse 67   Temp 98.4 F (36.9 C) (Oral)  Resp 15   Ht 5\' 5"  (1.651 m)   Wt 149 lb 6.4 oz (67.8 kg)   SpO2 96%   BMI 24.86 kg/m   BP Readings from Last 3 Encounters:  12/20/17 120/78  04/10/17 (!) 168/90  07/22/16 138/90    Wt Readings from Last 3 Encounters:  12/20/17 149 lb 6.4 oz (67.8 kg)  04/10/17 158 lb 6.4 oz (71.8 kg)  07/22/16 165 lb (74.8 kg)    General appearance: alert, cooperative and appears stated age Ears: normal TM's and external  ear canals both ears Throat: lips, mucosa, and tongue normal; teeth and gums normal Neck: no adenopathy, no carotid bruit, supple, symmetrical, trachea midline and thyroid not enlarged, symmetric, no tenderness/mass/nodules Back: symmetric, no curvature. ROM normal. No CVA tenderness. Lungs: clear to auscultation bilaterally Heart: regular rate and rhythm, S1, S2 normal, no murmur, click, rub or gallop Abdomen: soft, non-tender; bowel sounds normal; no masses,  no organomegaly Pulses: 2+ and symmetric Skin: Skin color, texture, turgor normal. No rashes or lesions Lymph nodes: Cervical, supraclavicular, and axillary nodes normal.  Lab Results  Component Value Date   HGBA1C 8.2 (A) 12/20/2017   HGBA1C 7.6 04/10/2017   HGBA1C 8.1 07/22/2016    Lab Results  Component Value Date   CREATININE 0.92 12/20/2017   CREATININE 0.84 04/10/2017   CREATININE 0.84 07/22/2016    Lab Results  Component Value Date   WBC 6.3 12/20/2017   HGB 11.4 (L) 12/20/2017   HCT 34.8 (L) 12/20/2017   PLT 262.0 12/20/2017   GLUCOSE 168 (H) 12/20/2017   CHOL 150 12/20/2017   TRIG 152.0 (H) 12/20/2017   HDL 45.90 12/20/2017   LDLDIRECT 164.0 07/22/2016   LDLCALC 73 12/20/2017   ALT 42 (H) 12/20/2017   AST 87 (H) 12/20/2017   NA 137 12/20/2017   K 3.2 (L) 12/20/2017   CL 97 12/20/2017   CREATININE 0.92 12/20/2017   BUN 13 12/20/2017   CO2 34 (H) 12/20/2017   TSH 0.87 12/07/2012   HGBA1C 8.2 (A) 12/20/2017   MICROALBUR <0.7 12/20/2017    No results found.  Assessment & Plan:   Problem List Items Addressed This Visit    Generalized anxiety disorder    Improved with paxil 10 mg  Daily.  No changes today       Essential hypertension    Well controlled on losartan/hct  But she has been advised to resume  metoprolol given her history of CAD. Renal function stable, but potassium is low.  Will eliminate the hct and supplement the potassium   Lab Results  Component Value Date   CREATININE 0.92  12/20/2017   Lab Results  Component Value Date   NA 137 12/20/2017   K 3.2 (L) 12/20/2017   CL 97 12/20/2017   CO2 34 (H) 12/20/2017         Relevant Medications   metoprolol succinate (TOPROL-XL) 25 MG 24 hr tablet   Hyperlipidemia with target LDL less than 70    LDL and triglycerides have been addressed with high potency statin  Liver enzymes wil be repeated after 2 weeks of alcohol abstinence.   Lab Results  Component Value Date   CHOL 150 12/20/2017   HDL 45.90 12/20/2017   LDLCALC 73 12/20/2017   LDLDIRECT 164.0 07/22/2016   TRIG 152.0 (H) 12/20/2017   CHOLHDL 3 12/20/2017   Lab Results  Component Value Date   ALT 42 (H) 12/20/2017   AST 87 (H) 12/20/2017   ALKPHOS 52  12/20/2017   BILITOT 0.4 12/20/2017             Relevant Medications   metoprolol succinate (TOPROL-XL) 25 MG 24 hr tablet   Uncontrolled type 2 diabetes mellitus (HCC) - Primary    Secondary to medication lapse,  She is following a low GI diet.  Patient advised to resume glipizide 2.5 mg bid and continue metformin       Relevant Medications   glipiZIDE (GLUCOTROL) 5 MG tablet   Diabetic nephropathy associated with type 2 diabetes mellitus (HCC)    Her proteinuria has resolved.  Continue losartan  Lab Results  Component Value Date   MICROALBUR <0.7 12/20/2017         Relevant Medications   glipiZIDE (GLUCOTROL) 5 MG tablet   Elevated liver enzymes    2:1 pattern of AST:ALT elevation suggests alcohol excess.  Repeat in 2 weeks after alcohol abstinence.        Relevant Orders   Comprehensive metabolic panel   Hepatitis C antibody   Hepatitis B core antibody, total   Hepatitis B surface antibody,qualitative   Vitamin D deficiency    Megadose to be prescribed weekly for 3 months       Relevant Orders   VITAMIN D 25 Hydroxy (Vit-D Deficiency, Fractures) (Completed)   Diuretic-induced hypokalemia    Potassium supplementa advised.        Animal scratch    She has an angry  scratch on the dorsum of her right foot caused by her [place].  Given her uncontrolled diabetes,  I have advised her to monitor closely  and start augmentin if she develops signs of cellulitis.        Other Visit Diagnoses    Breast cancer screening       Relevant Orders   MM 3D SCREEN BREAST BILATERAL   Fatigue, unspecified type       Relevant Orders   CBC with Differential/Platelet (Completed)     A total of 40 minutes was spent with patient more than half of which was spent in counseling patient on the above mentioned issues , reviewing and explaining recent labs and imaging studies done, and coordination of care. I have changed Rhea Pink. Scheffler's glipiZIDE, metoprolol succinate, and potassium chloride SA. I am also having her start on amoxicillin-clavulanate and ergocalciferol. Additionally, I am having her maintain her clopidogrel, losartan-hydrochlorothiazide, ALPRAZolam, PARoxetine, metFORMIN, and atorvastatin.  Meds ordered this encounter  Medications  . glipiZIDE (GLUCOTROL) 5 MG tablet    Sig: Take 0.5 tablets (2.5 mg total) by mouth 2 (two) times daily before a meal.    Dispense:  180 tablet    Refill:  3  . metoprolol succinate (TOPROL-XL) 25 MG 24 hr tablet    Sig: Take 0.5 tablets (12.5 mg total) by mouth daily. At bedtime    Dispense:  90 tablet    Refill:  3  . amoxicillin-clavulanate (AUGMENTIN) 875-125 MG tablet    Sig: Take 1 tablet by mouth 2 (two) times daily.    Dispense:  14 tablet    Refill:  0  . potassium chloride SA (K-DUR,KLOR-CON) 20 MEQ tablet    Sig: Take 1 tablet (20 mEq total) by mouth daily.    Dispense:  30 tablet    Refill:  0  . ergocalciferol (DRISDOL) 50000 units capsule    Sig: Take 1 capsule (50,000 Units total) by mouth once a week.    Dispense:  12 capsule    Refill:  0  Medications Discontinued During This Encounter  Medication Reason  . glipiZIDE (GLUCOTROL) 5 MG tablet Patient has not taken in last 30 days  . metoprolol  succinate (TOPROL-XL) 25 MG 24 hr tablet Reorder  . potassium chloride SA (K-DUR,KLOR-CON) 20 MEQ tablet Reorder    Follow-up: Return in about 6 months (around 06/22/2018) for follow up diabetes.   Sherlene Shams, MD

## 2017-12-20 NOTE — Patient Instructions (Signed)
Welcome back!     I want you to resume glipizide at 2.5 mg twice daily before breakfast and dinner  Goal Blood sugars ;  80 to 15820fasting .   160 or less 2 hrs after a meal (post prandial)   Resume metoprolol at 1/2 tablet at night.   Continue losartan and metformin.   Start the amoxicillin antibiotic if yor scratch starts to look infected (red streaks or spreading redness)  Taking an antibiotic can create an imbalance in the normal population of bacteria that live in the small intestine.  This imbalance can persist for 3 months.   Taking a probiotic ( Align, Floraque or Culturelle), the generic version of one of these over the counter medications, or an alternative form (kombucha,  Yogurt, or another dietary source) for a minimum of 3 weeks may help prevent a serious antibiotic associated diarrhea  Called clostridium dificile colitis that occurs when the bacteria population is altered .  Taking a probiotic may also prevent vaginitis due to yeast infections and can be continued indefinitely if you feel that it improves your digestion or your elimination (bowels).

## 2017-12-22 DIAGNOSIS — E876 Hypokalemia: Secondary | ICD-10-CM | POA: Insufficient documentation

## 2017-12-22 DIAGNOSIS — T148XXA Other injury of unspecified body region, initial encounter: Secondary | ICD-10-CM | POA: Insufficient documentation

## 2017-12-22 DIAGNOSIS — E559 Vitamin D deficiency, unspecified: Secondary | ICD-10-CM | POA: Insufficient documentation

## 2017-12-22 DIAGNOSIS — T502X5A Adverse effect of carbonic-anhydrase inhibitors, benzothiadiazides and other diuretics, initial encounter: Secondary | ICD-10-CM

## 2017-12-22 DIAGNOSIS — R748 Abnormal levels of other serum enzymes: Secondary | ICD-10-CM | POA: Insufficient documentation

## 2017-12-22 MED ORDER — ERGOCALCIFEROL 1.25 MG (50000 UT) PO CAPS: 50000.0000 [IU] | ORAL_CAPSULE | ORAL | 0 refills | Status: DC

## 2017-12-22 MED ORDER — POTASSIUM CHLORIDE CRYS ER 20 MEQ PO TBCR: 20.0000 meq | EXTENDED_RELEASE_TABLET | Freq: Every day | ORAL | 0 refills | Status: DC

## 2017-12-22 NOTE — Assessment & Plan Note (Addendum)
Well controlled on losartan/hct  But she has been advised to resume  metoprolol given her history of CAD. Renal function stable, but potassium is low.  Will eliminate the hct and supplement the potassium   Lab Results  Component Value Date   CREATININE 0.92 12/20/2017   Lab Results  Component Value Date   NA 137 12/20/2017   K 3.2 (L) 12/20/2017   CL 97 12/20/2017   CO2 34 (H) 12/20/2017

## 2017-12-22 NOTE — Assessment & Plan Note (Signed)
Improved with paxil 10 mg  Daily.  No changes today  

## 2017-12-22 NOTE — Assessment & Plan Note (Addendum)
Secondary to medication lapse,  She is following a low GI diet.  Patient advised to resume glipizide 2.5 mg bid and continue metformin

## 2017-12-22 NOTE — Assessment & Plan Note (Signed)
Megadose to be prescribed weekly for 3 months

## 2017-12-22 NOTE — Assessment & Plan Note (Signed)
LDL and triglycerides have been addressed with high potency statin  Liver enzymes wil be repeated after 2 weeks of alcohol abstinence.   Lab Results  Component Value Date   CHOL 150 12/20/2017   HDL 45.90 12/20/2017   LDLCALC 73 12/20/2017   LDLDIRECT 164.0 07/22/2016   TRIG 152.0 (H) 12/20/2017   CHOLHDL 3 12/20/2017   Lab Results  Component Value Date   ALT 42 (H) 12/20/2017   AST 87 (H) 12/20/2017   ALKPHOS 52 12/20/2017   BILITOT 0.4 12/20/2017

## 2017-12-22 NOTE — Assessment & Plan Note (Signed)
Her proteinuria has resolved.  Continue losartan  Lab Results  Component Value Date   MICROALBUR <0.7 12/20/2017

## 2017-12-22 NOTE — Assessment & Plan Note (Signed)
2:1 pattern of AST:ALT elevation suggests alcohol excess.  Repeat in 2 weeks after alcohol abstinence.

## 2017-12-22 NOTE — Assessment & Plan Note (Signed)
Potassium supplementa advised.

## 2017-12-22 NOTE — Assessment & Plan Note (Signed)
She has an angry scratch on the dorsum of her right foot caused by her [place].  Given her uncontrolled diabetes,  I have advised her to monitor closely  and start augmentin if she develops signs of cellulitis.

## 2018-01-10 ENCOUNTER — Other Ambulatory Visit: Payer: Self-pay | Admitting: Internal Medicine

## 2018-01-15 ENCOUNTER — Other Ambulatory Visit: Payer: Self-pay | Admitting: Internal Medicine

## 2018-01-15 NOTE — Telephone Encounter (Signed)
Refilled: 11/16/2017 Last OV: 12/20/2017 Next OV: 03/06/2018

## 2018-01-23 ENCOUNTER — Encounter: Payer: Self-pay | Admitting: Internal Medicine

## 2018-01-23 ENCOUNTER — Other Ambulatory Visit: Payer: Self-pay | Admitting: Internal Medicine

## 2018-02-27 ENCOUNTER — Other Ambulatory Visit: Payer: Self-pay | Admitting: Internal Medicine

## 2018-03-01 ENCOUNTER — Other Ambulatory Visit: Payer: No Typology Code available for payment source

## 2018-03-05 ENCOUNTER — Other Ambulatory Visit: Payer: No Typology Code available for payment source

## 2018-03-06 ENCOUNTER — Encounter: Payer: No Typology Code available for payment source | Admitting: Internal Medicine

## 2018-03-14 ENCOUNTER — Other Ambulatory Visit: Payer: Self-pay | Admitting: Internal Medicine

## 2018-03-14 NOTE — Telephone Encounter (Signed)
Refilled: 01/15/2018 Last OV: 12/20/2017 Next OV: 04/17/2018

## 2018-03-18 ENCOUNTER — Other Ambulatory Visit: Payer: Self-pay | Admitting: Internal Medicine

## 2018-03-29 ENCOUNTER — Other Ambulatory Visit: Payer: Self-pay

## 2018-03-29 MED ORDER — LOSARTAN POTASSIUM-HCTZ 100-25 MG PO TABS: 1.0000 | ORAL_TABLET | Freq: Every day | ORAL | 0 refills | Status: DC

## 2018-04-06 ENCOUNTER — Other Ambulatory Visit: Payer: Self-pay | Admitting: Internal Medicine

## 2018-04-16 ENCOUNTER — Other Ambulatory Visit (INDEPENDENT_AMBULATORY_CARE_PROVIDER_SITE_OTHER): Payer: No Typology Code available for payment source

## 2018-04-16 DIAGNOSIS — R748 Abnormal levels of other serum enzymes: Secondary | ICD-10-CM

## 2018-04-16 NOTE — Addendum Note (Signed)
Addended by: Penne LashWIGGINS, Arthuro Canelo N on: 04/16/2018 08:26 AM   Modules accepted: Orders

## 2018-04-17 ENCOUNTER — Ambulatory Visit (INDEPENDENT_AMBULATORY_CARE_PROVIDER_SITE_OTHER): Payer: No Typology Code available for payment source | Admitting: Internal Medicine

## 2018-04-17 ENCOUNTER — Encounter: Payer: Self-pay | Admitting: Internal Medicine

## 2018-04-17 VITALS — BP 182/88 | HR 72 | Temp 98.2°F | Resp 15 | Ht 65.0 in | Wt 159.0 lb

## 2018-04-17 DIAGNOSIS — I70213 Atherosclerosis of native arteries of extremities with intermittent claudication, bilateral legs: Secondary | ICD-10-CM | POA: Diagnosis not present

## 2018-04-17 DIAGNOSIS — I10 Essential (primary) hypertension: Secondary | ICD-10-CM | POA: Diagnosis not present

## 2018-04-17 DIAGNOSIS — F411 Generalized anxiety disorder: Secondary | ICD-10-CM

## 2018-04-17 DIAGNOSIS — Z299 Encounter for prophylactic measures, unspecified: Secondary | ICD-10-CM | POA: Diagnosis not present

## 2018-04-17 DIAGNOSIS — E1165 Type 2 diabetes mellitus with hyperglycemia: Secondary | ICD-10-CM | POA: Diagnosis not present

## 2018-04-17 DIAGNOSIS — E1121 Type 2 diabetes mellitus with diabetic nephropathy: Secondary | ICD-10-CM

## 2018-04-17 DIAGNOSIS — R748 Abnormal levels of other serum enzymes: Secondary | ICD-10-CM

## 2018-04-17 DIAGNOSIS — E119 Type 2 diabetes mellitus without complications: Secondary | ICD-10-CM

## 2018-04-17 LAB — POCT GLYCOSYLATED HEMOGLOBIN (HGB A1C): HEMOGLOBIN A1C: 6.4 % — AB (ref 4.0–5.6)

## 2018-04-17 LAB — HEPATITIS C ANTIBODY
Hepatitis C Ab: NONREACTIVE
SIGNAL TO CUT-OFF: 0.01 (ref <1.00)

## 2018-04-17 LAB — COMPREHENSIVE METABOLIC PANEL
AG Ratio: 2.2 (calc) (ref 1.0–2.5)
ALT: 30 U/L — ABNORMAL HIGH (ref 6–29)
AST: 56 U/L — AB (ref 10–35)
Albumin: 4.9 g/dL (ref 3.6–5.1)
Alkaline phosphatase (APISO): 52 U/L (ref 33–130)
BILIRUBIN TOTAL: 0.4 mg/dL (ref 0.2–1.2)
BUN: 14 mg/dL (ref 7–25)
CHLORIDE: 103 mmol/L (ref 98–110)
CO2: 24 mmol/L (ref 20–32)
Calcium: 10 mg/dL (ref 8.6–10.4)
Creat: 0.78 mg/dL (ref 0.50–1.05)
GLOBULIN: 2.2 g/dL (ref 1.9–3.7)
Glucose, Bld: 156 mg/dL — ABNORMAL HIGH (ref 65–99)
Potassium: 4.1 mmol/L (ref 3.5–5.3)
Sodium: 139 mmol/L (ref 135–146)
Total Protein: 7.1 g/dL (ref 6.1–8.1)

## 2018-04-17 LAB — HEPATITIS B SURFACE ANTIBODY,QUALITATIVE: HEP B S AB: REACTIVE — AB

## 2018-04-17 LAB — HEPATITIS B CORE ANTIBODY, TOTAL: HEP B C TOTAL AB: REACTIVE — AB

## 2018-04-17 MED ORDER — ALPRAZOLAM 0.5 MG PO TABS: 0.5000 mg | ORAL_TABLET | Freq: Two times a day (BID) | ORAL | 5 refills | Status: DC | PRN

## 2018-04-17 MED ORDER — LISINOPRIL 40 MG PO TABS: 40.0000 mg | ORAL_TABLET | Freq: Every day | ORAL | 0 refills | Status: DC

## 2018-04-17 MED ORDER — SIMVASTATIN 80 MG PO TABS: 80.0000 mg | ORAL_TABLET | Freq: Every day | ORAL | 11 refills | Status: DC

## 2018-04-17 MED ORDER — METFORMIN HCL 850 MG PO TABS: 850.0000 mg | ORAL_TABLET | Freq: Two times a day (BID) | ORAL | 11 refills | Status: DC

## 2018-04-17 MED ORDER — PAROXETINE HCL 20 MG PO TABS: 20.0000 mg | ORAL_TABLET | Freq: Every day | ORAL | 11 refills | Status: DC

## 2018-04-17 MED ORDER — CARVEDILOL 3.125 MG PO TABS: 3.1250 mg | ORAL_TABLET | Freq: Two times a day (BID) | ORAL | 11 refills | Status: DC

## 2018-04-17 MED ORDER — HYDROCHLOROTHIAZIDE 25 MG PO TABS: 25.0000 mg | ORAL_TABLET | Freq: Every day | ORAL | 3 refills | Status: DC

## 2018-04-17 MED ORDER — GLIPIZIDE 5 MG PO TABS: 5.0000 mg | ORAL_TABLET | Freq: Two times a day (BID) | ORAL | 3 refills | Status: DC

## 2018-04-17 MED ORDER — CLOPIDOGREL BISULFATE 75 MG PO TABS: 75.0000 mg | ORAL_TABLET | Freq: Every day | ORAL | 11 refills | Status: DC

## 2018-04-17 NOTE — Patient Instructions (Addendum)
Please call Joellyn QuailsChristy Burton [phone].  She works for the [employer] [employer] ([employer])   She may be able to get you a free PAP smear and mammogram   Check with your insurance about the Cologuard test as a covered screening test for colon cancer.   I have made several substitutions to try to get your monthly cost of prescriptions down:  Carvedilol two times  daily instead of metoprolol  Lisinopril ,  No hctz instead of losartan/hct ) so no potassium needed either!  Simvastatin instead of atorvastatin  I have INCREASED  The paxil dose to 20 mg daily  Check your blood pressure after you have been back on your medications for 1-2 weeks and let me know  The results

## 2018-04-17 NOTE — Progress Notes (Signed)
Patient ID: Sharon Melton, female    DOB: 02/04/1961  Age: 57 y.o. MRN: [id number]  The patient is here for preventive  examination and management of other chronic and acute problems.    Declines PAP SMEAR last one 2014 Last mammogram 2013  No prior colonoscopy Last eye exam 2014   The risk factors are reflected in the social history.  The roster of all physicians providing medical care to patient - is listed in the Snapshot section of the chart.  Activities of daily living:  The patient is 100% independent in all ADLs: dressing, toileting, feeding as well as independent mobility  Home safety : The patient has smoke detectors in the home. They wear seatbelts.  There are no firearms at home. There is no violence in the home.   There is no risks for hepatitis, STDs or HIV. There is no   history of blood transfusion. They have no travel history to infectious disease endemic areas of the world.  The patient has not seen their dentist in the last six month. They have not seen their eye doctor in the last year. They admit denied hearing difficulty with regard to whispered voices and some television programs.  They have deferred audiologic testing in the last year.  They do not  have excessive sun exposure. Discussed the need for sun protection: hats, long sleeves and use of sunscreen if there is significant sun exposure.   Diet: the importance of a healthy diet is discussed. They do have a healthy diet.  The benefits of regular aerobic exercise were discussed. She walks 4 times per week ,  20 minutes.   Depression screen: there are no signs or vegative symptoms of depression- irritability, change in appetite, anhedonia, sadness/tearfullness.  Cognitive assessment: the patient manages all their financial and personal affairs and is actively engaged. They could relate day,date,year and events; recalled 2/3 objects at 3 minutes; performed clock-face test normally.  The following portions of  the patient's history were reviewed and updated as appropriate: allergies, current medications, past family history, past medical history,  past surgical history, past social history  and problem list.  Visual acuity was not assessed per patient preference since she has regular follow up with her ophthalmologist. Hearing and body mass index were assessed and reviewed.   During the course of the visit the patient was educated and counseled about appropriate screening and preventive services including : fall prevention , diabetes screening, nutrition counseling, colorectal cancer screening, and recommended immunizations.    CC: The primary encounter diagnosis was Encounter for preventive measure. Diagnoses of Uncontrolled type 2 diabetes mellitus with hyperglycemia (HCC), Essential hypertension, Diabetic nephropathy associated with type 2 diabetes mellitus (HCC), Atherosclerosis of native artery of both lower extremities with intermittent claudication (HCC), Elevated liver enzymes, Generalized anxiety disorder, and Diabetes mellitus type 2 in nonobese [facility](HCC) were also pertinent to this visit.  Ran out of meds one month ago.  Insurance not covering meds.  All meds are generic ,  Total monthly cost $200/month.     History Sharon Melton has a past medical history of Diabetes mellitus, Hyperlipidemia, Hypertension, and Myocardial infarction (HCC) (02/2007).   She has a past surgical history that includes Spine surgery.   Her family history includes COPD in her maternal uncle; Hearing loss in her maternal grandfather; Heart disease in her maternal grandmother, mother, and paternal grandfather; Mental illness in her mother.She reports that she quit smoking about 11 years ago. She has never used smokeless tobacco.  She reports that she has current or past drug history. She reports that she does not drink alcohol.  Outpatient Medications Prior to Visit  Medication Sig Dispense Refill  . ergocalciferol (DRISDOL)  50000 units capsule Take 1 capsule (50,000 Units total) by mouth once a week. 12 capsule 0  . ALPRAZolam (XANAX) 0.5 MG tablet TAKE 1 TABLET BY MOUTH TWICE DAILY AS NEEDED FOR ANXIETY 60 tablet 0  . atorvastatin (LIPITOR) 80 MG tablet TAKE 1 TABLET(80 MG) BY MOUTH DAILY 90 tablet 1  . clopidogrel (PLAVIX) 75 MG tablet take 1 tablet by mouth once daily 30 tablet 5  . glipiZIDE (GLUCOTROL) 5 MG tablet Take 0.5 tablets (2.5 mg total) by mouth 2 (two) times daily before a meal. 180 tablet 3  . losartan-hydrochlorothiazide (HYZAAR) 100-25 MG tablet Take 1 tablet by mouth daily. 90 tablet 0  . metFORMIN (GLUCOPHAGE) 850 MG tablet TAKE 1 TABLET BY MOUTH TWICE A DAY WITH MEALS 180 tablet 1  . metoprolol succinate (TOPROL-XL) 25 MG 24 hr tablet Take 0.5 tablets (12.5 mg total) by mouth daily. At bedtime 90 tablet 3  . PARoxetine (PAXIL) 10 MG tablet Take 1 tablet (10 mg total) by mouth daily. 90 tablet 1  . potassium chloride SA (K-DUR,KLOR-CON) 20 MEQ tablet TAKE 1 TABLET(20 MEQ) BY MOUTH DAILY 30 tablet 0  . amoxicillin-clavulanate (AUGMENTIN) 875-125 MG tablet Take 1 tablet by mouth 2 (two) times daily. (Patient not taking: Reported on 04/17/2018) 14 tablet 0   No facility-administered medications prior to visit.     Review of Systems   Patient denies headache, fevers, malaise, unintentional weight loss, skin rash, eye pain, sinus congestion and sinus pain, sore throat, dysphagia,  hemoptysis , cough, dyspnea, wheezing, chest pain, palpitations, orthopnea, edema, abdominal pain, nausea, melena, diarrhea, constipation, flank pain, dysuria, hematuria, urinary  Frequency, nocturia, numbness, tingling, seizures,  Focal weakness, Loss of consciousness,  Tremor, insomnia, depression, anxiety, and suicidal ideation.      Objective:  BP (!) 182/88 (BP Location: Right Arm, Patient Position: Sitting, Cuff Size: Normal)   Pulse 72   Temp 98.2 F (36.8 C) (Oral)   Resp 15   Ht 5\' 5"  (1.651 m)   Wt 159 lb  (72.1 kg)   SpO2 97%   BMI 26.46 kg/m   Physical Exam   General appearance: alert, cooperative and appears stated age Head: Normocephalic, without obvious abnormality, atraumatic Eyes: conjunctivae/corneas clear. PERRL, EOM's intact. Fundi benign. Ears: normal TM's and external ear canals both ears Nose: Nares normal. Septum midline. Mucosa normal. No drainage or sinus tenderness. Throat: lips, mucosa, and tongue normal; teeth and gums normal Neck: no adenopathy, no carotid bruit, no JVD, supple, symmetrical, trachea midline and thyroid not enlarged, symmetric, no tenderness/mass/nodules Lungs: clear to auscultation bilaterally Breasts: normal appearance, no masses or tenderness Heart: regular rate and rhythm, S1, S2 normal, no murmur, click, rub or gallop Abdomen: soft, non-tender; bowel sounds normal; no masses,  no organomegaly Extremities: extremities normal, atraumatic, no cyanosis or edema Pulses: 2+ and symmetric Skin: Skin color, texture, turgor normal. No rashes or lesions Neurologic: Alert and oriented X 3, normal strength and tone. Normal symmetric reflexes. Normal coordination and gait.      Assessment & Plan:   Problem List Items Addressed This Visit    Atherosclerosis of native artery of extremity with intermittent claudication [facility])    She has deferred vascular evaluation .  Continue statin therapy. . She is not smoking. Lab Results  Component Value Date  CHOL 150 12/20/2017   HDL 45.90 12/20/2017   LDLCALC 73 12/20/2017   LDLDIRECT 164.0 07/22/2016   TRIG 152.0 (H) 12/20/2017   CHOLHDL 3 12/20/2017   Lab Results  Component Value Date   ALT 30 (H) 04/16/2018   AST 56 (H) 04/16/2018   ALKPHOS 52 12/20/2017   BILITOT 0.4 04/16/2018         Diabetes mellitus type 2 in nonobese [facility])    Currently well-controlled despite a one month lapse in medications .  hemoglobin A1c is at goal of less than 7.0 . Patient is reminded to schedule an annual eye exam and  foot exam is normal today. Patient has no microalbuminuria. Patient is tolerating statin therapy for CAD risk reduction and on ACE/ARB for renal protection and hypertension   Lab Results  Component Value Date   CREATININE 0.78 04/16/2018   Lab Results  Component Value Date   HGBA1C 6.4 (A) 04/17/2018   Lab Results  Component Value Date   MICROALBUR <0.7 12/20/2017         Relevant Medications   lisinopril (PRINIVIL,ZESTRIL) 40 MG tablet   simvastatin (ZOCOR) 80 MG tablet   metFORMIN (GLUCOPHAGE) 850 MG tablet   glipiZIDE (GLUCOTROL) 5 MG tablet   Diabetic nephropathy associated with type 2 diabetes mellitus (HCC)    Her proteinuria has resolved.  Changing losartan to lisinopril for cost savings   Lab Results  Component Value Date   MICROALBUR <0.7 12/20/2017         Relevant Medications   lisinopril (PRINIVIL,ZESTRIL) 40 MG tablet   simvastatin (ZOCOR) 80 MG tablet   metFORMIN (GLUCOPHAGE) 850 MG tablet   glipiZIDE (GLUCOTROL) 5 MG tablet   Elevated liver enzymes    Liver enzyme elevation ina 2:1 pattern of ASL> ALT continues to suggest alcohol use.  Lab Results  Component Value Date   ALT 30 (H) 04/16/2018   AST 56 (H) 04/16/2018   ALKPHOS 52 12/20/2017   BILITOT 0.4 04/16/2018         Encounter for preventive measure - Primary    Annual comprehensive preventive exam was done as well as an evaluation and management of chronic conditions .  During the course of the visit the patient was educated and counseled about appropriate screening and preventive services including :  diabetes screening, lipid analysis with projected  10 year  risk for CAD , nutrition counseling, breast, cervical and colorectal cancer screening, and recommended immunizations.  Printed recommendations for health maintenance screenings was give      Essential hypertension    Elevated due to lapse in medication..  Advised to resume metroprolol and lisinopril,   Lab Results  Component Value  Date   CREATININE 0.78 04/16/2018   Lab Results  Component Value Date   NA 139 04/16/2018   K 4.1 04/16/2018   CL 103 04/16/2018   CO2 24 04/16/2018         Relevant Medications   lisinopril (PRINIVIL,ZESTRIL) 40 MG tablet   carvedilol (COREG) 3.125 MG tablet   simvastatin (ZOCOR) 80 MG tablet   Generalized anxiety disorder    Improved with paxil 10 mg  Daily.  No changes today       Relevant Medications   PARoxetine (PAXIL) 20 MG tablet   ALPRAZolam (XANAX) 0.5 MG tablet   Uncontrolled type 2 diabetes mellitus (HCC)   Relevant Medications   lisinopril (PRINIVIL,ZESTRIL) 40 MG tablet   simvastatin (ZOCOR) 80 MG tablet   metFORMIN (GLUCOPHAGE) 850 MG  tablet   glipiZIDE (GLUCOTROL) 5 MG tablet   Other Relevant Orders   POCT HgB A1C (Completed)      I have discontinued Rhea Pink. Spates's atorvastatin, metoprolol succinate, amoxicillin-clavulanate, losartan-hydrochlorothiazide, potassium chloride SA, and hydrochlorothiazide. I have also changed her PARoxetine, metFORMIN, glipiZIDE, clopidogrel, and ALPRAZolam. Additionally, I am having her start on lisinopril, carvedilol, and simvastatin. Lastly, I am having her maintain her ergocalciferol.  Meds ordered this encounter  Medications  . lisinopril (PRINIVIL,ZESTRIL) 40 MG tablet    Sig: Take 1 tablet (40 mg total) by mouth daily.    Dispense:  90 tablet    Refill:  0  . DISCONTD: hydrochlorothiazide (HYDRODIURIL) 25 MG tablet    Sig: Take 1 tablet (25 mg total) by mouth daily.    Dispense:  90 tablet    Refill:  3  . PARoxetine (PAXIL) 20 MG tablet    Sig: Take 1 tablet (20 mg total) by mouth daily.    Dispense:  30 tablet    Refill:  11  . carvedilol (COREG) 3.125 MG tablet    Sig: Take 1 tablet (3.125 mg total) by mouth 2 (two) times daily with a meal.    Dispense:  60 tablet    Refill:  11  . simvastatin (ZOCOR) 80 MG tablet    Sig: Take 1 tablet (80 mg total) by mouth daily.    Dispense:  30 tablet    Refill:   11  . metFORMIN (GLUCOPHAGE) 850 MG tablet    Sig: Take 1 tablet (850 mg total) by mouth 2 (two) times daily with a meal.    Dispense:  60 tablet    Refill:  11  . glipiZIDE (GLUCOTROL) 5 MG tablet    Sig: Take 1 tablet (5 mg total) by mouth 2 (two) times daily before a meal.    Dispense:  180 tablet    Refill:  3  . clopidogrel (PLAVIX) 75 MG tablet    Sig: Take 1 tablet (75 mg total) by mouth daily.    Dispense:  30 tablet    Refill:  11  . ALPRAZolam (XANAX) 0.5 MG tablet    Sig: Take 1 tablet (0.5 mg total) by mouth 2 (two) times daily as needed. for anxiety    Dispense:  60 tablet    Refill:  5    Medications Discontinued During This Encounter  Medication Reason  . amoxicillin-clavulanate (AUGMENTIN) 875-125 MG tablet Completed Course  . PARoxetine (PAXIL) 10 MG tablet   . losartan-hydrochlorothiazide (HYZAAR) 100-25 MG tablet   . metoprolol succinate (TOPROL-XL) 25 MG 24 hr tablet   . atorvastatin (LIPITOR) 80 MG tablet   . metFORMIN (GLUCOPHAGE) 850 MG tablet Reorder  . glipiZIDE (GLUCOTROL) 5 MG tablet Reorder  . clopidogrel (PLAVIX) 75 MG tablet Reorder  . ALPRAZolam (XANAX) 0.5 MG tablet Reorder  . hydrochlorothiazide (HYDRODIURIL) 25 MG tablet   . potassium chloride SA (K-DUR,KLOR-CON) 20 MEQ tablet     Follow-up: No follow-ups on file.   Sherlene Shams, MD

## 2018-04-19 DIAGNOSIS — E119 Type 2 diabetes mellitus without complications: Secondary | ICD-10-CM | POA: Insufficient documentation

## 2018-04-19 NOTE — Assessment & Plan Note (Signed)
Currently well-controlled despite a one month lapse in medications .  hemoglobin A1c is at goal of less than 7.0 . Patient is reminded to schedule an annual eye exam and foot exam is normal today. Patient has no microalbuminuria. Patient is tolerating statin therapy for CAD risk reduction and on ACE/ARB for renal protection and hypertension   Lab Results  Component Value Date   CREATININE 0.78 04/16/2018   Lab Results  Component Value Date   HGBA1C 6.4 (A) 04/17/2018   Lab Results  Component Value Date   MICROALBUR <0.7 12/20/2017

## 2018-04-19 NOTE — Assessment & Plan Note (Signed)
Improved with paxil 10 mg  Daily.  No changes today

## 2018-04-19 NOTE — Assessment & Plan Note (Addendum)
Elevated due to lapse in medication..  Advised to resume metroprolol and lisinopril,   Lab Results  Component Value Date   CREATININE 0.78 04/16/2018   Lab Results  Component Value Date   NA 139 04/16/2018   K 4.1 04/16/2018   CL 103 04/16/2018   CO2 24 04/16/2018

## 2018-04-19 NOTE — Assessment & Plan Note (Addendum)
She has deferred vascular evaluation .  Continue statin therapy. . She is not smoking. Lab Results  Component Value Date   CHOL 150 12/20/2017   HDL 45.90 12/20/2017   LDLCALC 73 12/20/2017   LDLDIRECT 164.0 07/22/2016   TRIG 152.0 (H) 12/20/2017   CHOLHDL 3 12/20/2017   Lab Results  Component Value Date   ALT 30 (H) 04/16/2018   AST 56 (H) 04/16/2018   ALKPHOS 52 12/20/2017   BILITOT 0.4 04/16/2018

## 2018-04-19 NOTE — Assessment & Plan Note (Signed)
Liver enzyme elevation ina 2:1 pattern of ASL> ALT continues to suggest alcohol use.  Lab Results  Component Value Date   ALT 30 (H) 04/16/2018   AST 56 (H) 04/16/2018   ALKPHOS 52 12/20/2017   BILITOT 0.4 04/16/2018

## 2018-04-19 NOTE — Assessment & Plan Note (Addendum)
Her proteinuria has resolved.  Changing losartan to lisinopril for cost savings   Lab Results  Component Value Date   MICROALBUR <0.7 12/20/2017

## 2018-04-19 NOTE — Assessment & Plan Note (Signed)
Annual comprehensive preventive exam was done as well as an evaluation and management of chronic conditions .  During the course of the visit the patient was educated and counseled about appropriate screening and preventive services including :  diabetes screening, lipid analysis with projected  10 year  risk for CAD , nutrition counseling, breast, cervical and colorectal cancer screening, and recommended immunizations.  Printed recommendations for health maintenance screenings was give 

## 2018-06-16 ENCOUNTER — Other Ambulatory Visit: Payer: Self-pay | Admitting: Internal Medicine

## 2018-06-25 ENCOUNTER — Ambulatory Visit: Payer: No Typology Code available for payment source | Admitting: Internal Medicine

## 2018-10-01 ENCOUNTER — Other Ambulatory Visit: Payer: Self-pay | Admitting: Internal Medicine

## 2018-10-02 NOTE — Telephone Encounter (Signed)
11 26/19 last OV ok to fill ?

## 2018-11-06 ENCOUNTER — Other Ambulatory Visit: Payer: Self-pay | Admitting: Internal Medicine

## 2018-11-06 ENCOUNTER — Telehealth: Payer: Self-pay | Admitting: Internal Medicine

## 2018-11-06 MED ORDER — ALPRAZOLAM 0.5 MG PO TABS: 0.5000 mg | ORAL_TABLET | Freq: Two times a day (BID) | ORAL | 0 refills | Status: DC | PRN

## 2018-11-06 MED ORDER — LISINOPRIL 40 MG PO TABS: 40.0000 mg | ORAL_TABLET | Freq: Every day | ORAL | 0 refills | Status: DC

## 2018-11-06 NOTE — Telephone Encounter (Signed)
Refilled: 10/02/2018 Last OV: 04/17/2018 Next OV: 04/19/2019

## 2018-12-05 ENCOUNTER — Ambulatory Visit (INDEPENDENT_AMBULATORY_CARE_PROVIDER_SITE_OTHER): Payer: No Typology Code available for payment source | Admitting: Internal Medicine

## 2018-12-05 ENCOUNTER — Encounter: Payer: Self-pay | Admitting: Internal Medicine

## 2018-12-05 ENCOUNTER — Other Ambulatory Visit: Payer: Self-pay

## 2018-12-05 DIAGNOSIS — I10 Essential (primary) hypertension: Secondary | ICD-10-CM

## 2018-12-05 DIAGNOSIS — E785 Hyperlipidemia, unspecified: Secondary | ICD-10-CM

## 2018-12-05 DIAGNOSIS — F411 Generalized anxiety disorder: Secondary | ICD-10-CM | POA: Diagnosis not present

## 2018-12-05 DIAGNOSIS — R748 Abnormal levels of other serum enzymes: Secondary | ICD-10-CM

## 2018-12-05 DIAGNOSIS — E1165 Type 2 diabetes mellitus with hyperglycemia: Secondary | ICD-10-CM

## 2018-12-05 DIAGNOSIS — I2510 Atherosclerotic heart disease of native coronary artery without angina pectoris: Secondary | ICD-10-CM

## 2018-12-05 MED ORDER — TRAZODONE HCL 50 MG PO TABS: 25.0000 mg | ORAL_TABLET | Freq: Every evening | ORAL | 3 refills | Status: DC | PRN

## 2018-12-05 MED ORDER — ALPRAZOLAM 0.5 MG PO TABS: 0.5000 mg | ORAL_TABLET | Freq: Two times a day (BID) | ORAL | 3 refills | Status: DC | PRN

## 2018-12-05 MED ORDER — PAROXETINE HCL 30 MG PO TABS: 30.0000 mg | ORAL_TABLET | Freq: Every day | ORAL | 1 refills | Status: DC

## 2018-12-05 NOTE — Progress Notes (Signed)
Virtual Visit via [email or web address]  This visit type was conducted due to national recommendations for restrictions regarding the COVID-19 pandemic (e.g. social distancing).  This format is felt to be most appropriate for this patient at this time.  All issues noted in this document were discussed and addressed.  No physical exam was performed (except for noted visual exam findings with Video Visits).   I connected with@ on 12/05/18 at  3:30 PM EDT by a video enabled telemedicine application or telephone and verified that I am speaking with the correct person using two identifiers. Location patient: home Location provider: work or home office Persons participating in the virtual visit: patient, provider  I discussed the limitations, risks, security and privacy concerns of performing an evaluation and management service by telephone and the availability of in person appointments. I also discussed with the patient that there may be a patient responsible charge related to this service. The patient expressed understanding and agreed to proceed.   Reason for visit: follow up on anxiety . T2DM, hypertension and CAD  HPI:  58 yr old female with  History of CAD,  T2DM.  hypertension hyperlipidemia and MDD presents with increased  anxiety secondary to  COVID 19 PANDEMIC  Not sleeping well despite taking 0.5 mg alprazolam  Early wakeups  3/week .  TAKING pAXIL l 20 mg daily  Depressive symptoms better but feels stressed all the time   NOT SMOKING.   Reviewed prior labs ad AST/ALT elevation (2:1 ratio.  NO LONGER DRINKING  NO MAMMOGRAM IN YEARS.  UNINSURED    ROS: See pertinent positives and negatives per HPI.  Past Medical History:  Diagnosis Date  . Diabetes mellitus   . Hyperlipidemia   . Hypertension   . Myocardial infarction [facility]) 02/2007   Inferior STEMI with bare metal stent to the PDA placed at [facility]    Past Surgical History:  Procedure Laterality Date  . SPINE SURGERY     lumbar     Family History  Problem Relation Age of Onset  . Mental illness Mother   . Heart disease Mother   . COPD Maternal Uncle   . Heart disease Maternal Grandmother        first MI at 66  . Hearing loss Maternal Grandfather   . Heart disease Paternal Grandfather        30    SOCIAL HX:  reports that she quit smoking about 11 years ago. She has never used smokeless tobacco. She reports current drug use. She reports that she does not drink alcohol.   Current Outpatient Medications:  .  ALPRAZolam (XANAX) 0.5 MG tablet, Take 1 tablet (0.5 mg total) by mouth 2 (two) times daily as needed. for anxiety, Disp: 60 tablet, Rfl: 3 .  carvedilol (COREG) 3.125 MG tablet, Take 1 tablet (3.125 mg total) by mouth 2 (two) times daily with a meal., Disp: 60 tablet, Rfl: 11 .  clopidogrel (PLAVIX) 75 MG tablet, Take 1 tablet (75 mg total) by mouth daily., Disp: 30 tablet, Rfl: 11 .  glipiZIDE (GLUCOTROL) 5 MG tablet, Take 1 tablet (5 mg total) by mouth 2 (two) times daily before a meal., Disp: 180 tablet, Rfl: 3 .  losartan-hydrochlorothiazide (HYZAAR) 100-25 MG tablet, TAKE 1 TABLET BY MOUTH DAILY, Disp: 90 tablet, Rfl: 0 .  metFORMIN (GLUCOPHAGE) 850 MG tablet, Take 1 tablet (850 mg total) by mouth 2 (two) times daily with a meal., Disp: 60 tablet, Rfl: 11 .  PARoxetine (PAXIL) 30 MG tablet,  Take 1 tablet (30 mg total) by mouth daily., Disp: 90 tablet, Rfl: 1 .  simvastatin (ZOCOR) 80 MG tablet, Take 1 tablet (80 mg total) by mouth daily., Disp: 30 tablet, Rfl: 11 .  traZODone (DESYREL) 50 MG tablet, Take 0.5-1 tablets (25-50 mg total) by mouth at bedtime as needed for sleep., Disp: 90 tablet, Rfl: 3  EXAM:  VITALS per patient if applicable:  GENERAL: alert, oriented, appears well and in no acute distress  HEENT: atraumatic, conjunttiva clear, no obvious abnormalities on inspection of external nose and ears  NECK: normal movements of the head and neck  LUNGS: on inspection no signs of respiratory  distress, breathing rate appears normal, no obvious gross SOB, gasping or wheezing  CV: no obvious cyanosis  MS: moves all visible extremities without noticeable abnormality  PSYCH/NEURO: pleasant and cooperative, no obvious depression or anxiety, speech and thought processing grossly intact  ASSESSMENT AND PLAN:  Discussed the following assessment and plan:  Uncontrolled type 2 diabetes mellitus (HCC) Managed with glipizide and metformin. She is due for labs   hemoglobin A1c has been at  goal of less than 7.0 . Patient is reminded to schedule an annual eye exam and foot exam is normal today. Patient has no microalbuminuria. Patient is tolerating statin therapy for CAD risk reduction and on ACE/ARB for renal protection and hypertension   Lab Results  Component Value Date   CREATININE 0.78 04/16/2018   Lab Results  Component Value Date   HGBA1C 6.4 (A) 04/17/2018   Lab Results  Component Value Date   MICROALBUR <0.7 12/20/2017     Hyperlipidemia with target LDL less than 70 LDL and triglycerides have been addressed with high potency statin  Liver enzymes are due    Lab Results  Component Value Date   CHOL 150 12/20/2017   HDL 45.90 12/20/2017   LDLCALC 73 12/20/2017   LDLDIRECT 164.0 07/22/2016   TRIG 152.0 (H) 12/20/2017   CHOLHDL 3 12/20/2017   Lab Results  Component Value Date   ALT 30 (H) 04/16/2018   AST 56 (H) 04/16/2018   ALKPHOS 52 12/20/2017   BILITOT 0.4 04/16/2018         Generalized anxiety disorder Aggravated by the stress of the epidemic. Increasing paxil to 30 mg daily.  Adding trazodone for insomnia to reduce use of alprazolam    Coronary artery disease She has deferred vascular evaluation .  Continue statin therapy beta blocker, and plavix . Marland Kitchen. She is not smoking. Lab Results  Component Value Date   CHOL 150 12/20/2017   HDL 45.90 12/20/2017   LDLCALC 73 12/20/2017   LDLDIRECT 164.0 07/22/2016   TRIG 152.0 (H) 12/20/2017   CHOLHDL 3  12/20/2017   Lab Results  Component Value Date   ALT 30 (H) 04/16/2018   AST 56 (H) 04/16/2018   ALKPHOS 52 12/20/2017   BILITOT 0.4 04/16/2018     I discussed the assessment and treatment plan with the patient. The patient was provided an opportunity to ask questions and all were answered. The patient agreed with the plan and demonstrated an understanding of the instructions.   The patient was advised to call back or seek an in-person evaluation if the symptoms worsen or if the condition fails to improve as anticipated.  I provided  25 minutes of non-face-to-face time during this encounter.   Sherlene Shamseresa L Fadil Macmaster, MD

## 2018-12-06 ENCOUNTER — Other Ambulatory Visit: Payer: Self-pay | Admitting: Internal Medicine

## 2018-12-06 NOTE — Assessment & Plan Note (Signed)
LDL and triglycerides have been addressed with high potency statin  Liver enzymes are due    Lab Results  Component Value Date   CHOL 150 12/20/2017   HDL 45.90 12/20/2017   LDLCALC 73 12/20/2017   LDLDIRECT 164.0 07/22/2016   TRIG 152.0 (H) 12/20/2017   CHOLHDL 3 12/20/2017   Lab Results  Component Value Date   ALT 30 (H) 04/16/2018   AST 56 (H) 04/16/2018   ALKPHOS 52 12/20/2017   BILITOT 0.4 04/16/2018

## 2018-12-06 NOTE — Assessment & Plan Note (Addendum)
Managed with glipizide and metformin. She is due for labs   hemoglobin A1c has been at  goal of less than 7.0 . Patient is reminded to schedule an annual eye exam and foot exam is normal today. Patient has no microalbuminuria. Patient is tolerating statin therapy for CAD risk reduction and on ACE/ARB for renal protection and hypertension   Lab Results  Component Value Date   CREATININE 0.78 04/16/2018   Lab Results  Component Value Date   HGBA1C 6.4 (A) 04/17/2018   Lab Results  Component Value Date   MICROALBUR <0.7 12/20/2017

## 2018-12-06 NOTE — Assessment & Plan Note (Signed)
She has deferred vascular evaluation .  Continue statin therapy beta blocker, and plavix . Marland Kitchen She is not smoking. Lab Results  Component Value Date   CHOL 150 12/20/2017   HDL 45.90 12/20/2017   LDLCALC 73 12/20/2017   LDLDIRECT 164.0 07/22/2016   TRIG 152.0 (H) 12/20/2017   CHOLHDL 3 12/20/2017   Lab Results  Component Value Date   ALT 30 (H) 04/16/2018   AST 56 (H) 04/16/2018   ALKPHOS 52 12/20/2017   BILITOT 0.4 04/16/2018

## 2018-12-06 NOTE — Assessment & Plan Note (Addendum)
Aggravated by the stress of the epidemic. Increasing paxil to 30 mg daily.  Adding trazodone for insomnia to reduce use of alprazolam

## 2019-01-05 ENCOUNTER — Other Ambulatory Visit: Payer: Self-pay | Admitting: Internal Medicine

## 2019-01-14 ENCOUNTER — Encounter: Payer: Self-pay | Admitting: Internal Medicine

## 2019-04-03 ENCOUNTER — Telehealth: Payer: Self-pay | Admitting: *Deleted

## 2019-04-03 MED ORDER — ALPRAZOLAM 0.5 MG PO TABS: 0.5000 mg | ORAL_TABLET | Freq: Two times a day (BID) | ORAL | 3 refills | Status: DC | PRN

## 2019-04-03 NOTE — Telephone Encounter (Signed)
refilled 

## 2019-04-19 ENCOUNTER — Ambulatory Visit: Payer: No Typology Code available for payment source | Admitting: Internal Medicine

## 2019-05-30 ENCOUNTER — Other Ambulatory Visit: Payer: Self-pay

## 2019-05-30 MED ORDER — PAROXETINE HCL 30 MG PO TABS: 30.0000 mg | ORAL_TABLET | Freq: Every day | ORAL | 1 refills | Status: DC

## 2019-09-05 ENCOUNTER — Other Ambulatory Visit: Payer: Self-pay

## 2019-09-05 NOTE — Telephone Encounter (Signed)
Refill denied.  6 month follow up required for refills on all benzodiazepines   MyChart message sent

## 2019-09-05 NOTE — Telephone Encounter (Signed)
Refilled: 04/03/2019 Last OV: 12/05/2018 Next OV: not scheduled

## 2019-12-04 ENCOUNTER — Other Ambulatory Visit: Payer: Self-pay

## 2019-12-04 MED ORDER — PAROXETINE HCL 30 MG PO TABS: 30.0000 mg | ORAL_TABLET | Freq: Every day | ORAL | 0 refills | Status: DC

## 2019-12-04 MED ORDER — TRAZODONE HCL 50 MG PO TABS: 25.0000 mg | ORAL_TABLET | Freq: Every evening | ORAL | 0 refills | Status: DC | PRN

## 2020-03-03 ENCOUNTER — Other Ambulatory Visit: Payer: Self-pay | Admitting: Internal Medicine

## 2020-06-01 ENCOUNTER — Other Ambulatory Visit: Payer: Self-pay | Admitting: Internal Medicine

## 2020-09-09 ENCOUNTER — Other Ambulatory Visit: Payer: Self-pay | Admitting: Internal Medicine

## 2020-10-26 ENCOUNTER — Telehealth: Payer: Self-pay

## 2020-10-26 ENCOUNTER — Ambulatory Visit: Payer: Self-pay | Admitting: Internal Medicine

## 2020-10-26 DIAGNOSIS — Z0289 Encounter for other administrative examinations: Secondary | ICD-10-CM

## 2020-10-26 NOTE — Telephone Encounter (Signed)
Pt no showed for her appt on 10/26/2020. Last appt was 12/05/2019.

## 2020-10-26 NOTE — Telephone Encounter (Signed)
Correction:  July 2020!   Alprazolam has not been refilled recently but will NOT be refilled and mychart message sent.

## 2020-12-10 ENCOUNTER — Emergency Department: Payer: BC Managed Care – PPO

## 2020-12-10 ENCOUNTER — Telehealth: Payer: Self-pay | Admitting: Internal Medicine

## 2020-12-10 ENCOUNTER — Emergency Department
Admission: EM | Admit: 2020-12-10 | Discharge: 2020-12-10 | Disposition: A | Discharge status: Home / Self Care | Payer: BC Managed Care – PPO | Attending: Emergency Medicine | Admitting: Emergency Medicine

## 2020-12-10 ENCOUNTER — Other Ambulatory Visit: Payer: Self-pay

## 2020-12-10 ENCOUNTER — Encounter: Payer: Self-pay | Admitting: Emergency Medicine

## 2020-12-10 DIAGNOSIS — Z7984 Long term (current) use of oral hypoglycemic drugs: Secondary | ICD-10-CM | POA: Diagnosis not present

## 2020-12-10 DIAGNOSIS — Z79899 Other long term (current) drug therapy: Secondary | ICD-10-CM | POA: Insufficient documentation

## 2020-12-10 DIAGNOSIS — Z7902 Long term (current) use of antithrombotics/antiplatelets: Secondary | ICD-10-CM | POA: Insufficient documentation

## 2020-12-10 DIAGNOSIS — R42 Dizziness and giddiness: Secondary | ICD-10-CM

## 2020-12-10 DIAGNOSIS — R531 Weakness: Secondary | ICD-10-CM

## 2020-12-10 DIAGNOSIS — E119 Type 2 diabetes mellitus without complications: Secondary | ICD-10-CM

## 2020-12-10 DIAGNOSIS — Z87891 Personal history of nicotine dependence: Secondary | ICD-10-CM | POA: Diagnosis not present

## 2020-12-10 DIAGNOSIS — I10 Essential (primary) hypertension: Secondary | ICD-10-CM | POA: Insufficient documentation

## 2020-12-10 DIAGNOSIS — E1121 Type 2 diabetes mellitus with diabetic nephropathy: Secondary | ICD-10-CM | POA: Diagnosis not present

## 2020-12-10 LAB — BASIC METABOLIC PANEL
Anion gap: 10 (ref 5–15)
BUN: 13 mg/dL (ref 8–23)
CO2: 21 mmol/L — ABNORMAL LOW (ref 22–32)
Calcium: 9.4 mg/dL (ref 8.9–10.3)
Chloride: 102 mmol/L (ref 98–111)
Creatinine, Ser: 0.73 mg/dL (ref 0.44–1.00)
GFR, Estimated: >60 mL/min (ref >60)
Glucose, Bld: 366 mg/dL — ABNORMAL HIGH (ref 70–99)
Potassium: 3.2 mmol/L — ABNORMAL LOW (ref 3.5–5.1)
Sodium: 133 mmol/L — ABNORMAL LOW (ref 135–145)

## 2020-12-10 LAB — CBC
HCT: 45.4 % (ref 36.0–46.0)
Hemoglobin: 14.5 g/dL (ref 12.0–15.0)
MCH: 23.9 pg — ABNORMAL LOW (ref 26.0–34.0)
MCHC: 31.9 g/dL (ref 30.0–36.0)
MCV: 74.8 fL — ABNORMAL LOW (ref 80.0–100.0)
Platelets: 270 10*3/uL (ref 150–400)
RBC: 6.07 MIL/uL — ABNORMAL HIGH (ref 3.87–5.11)
RDW: 13.7 % (ref 11.5–15.5)
WBC: 7.5 10*3/uL (ref 4.0–10.5)
nRBC: 0 % (ref 0.0–0.2)

## 2020-12-10 LAB — TROPONIN I (HIGH SENSITIVITY): Troponin I (High Sensitivity): 4 ng/L (ref <18)

## 2020-12-10 MED ORDER — METFORMIN HCL 850 MG PO TABS: 850.0000 mg | ORAL_TABLET | Freq: Two times a day (BID) | ORAL | 1 refills | Status: DC

## 2020-12-10 MED ORDER — SODIUM CHLORIDE 0.9 % IV BOLUS
1000.0000 mL | Freq: Once | INTRAVENOUS | Status: AC
  Administered 2020-12-10: 1000 mL via INTRAVENOUS

## 2020-12-10 MED ORDER — POTASSIUM CHLORIDE CRYS ER 20 MEQ PO TBCR
40.0000 meq | EXTENDED_RELEASE_TABLET | Freq: Once | ORAL | Status: AC
  Administered 2020-12-10: 40 meq via ORAL
  Filled 2020-12-10: qty 2

## 2020-12-10 MED ORDER — LOSARTAN POTASSIUM 50 MG PO TABS
100.0000 mg | ORAL_TABLET | Freq: Once | ORAL | Status: AC
  Administered 2020-12-10: 100 mg via ORAL
  Filled 2020-12-10: qty 2

## 2020-12-10 MED ORDER — HYDROCHLOROTHIAZIDE 25 MG PO TABS
25.0000 mg | ORAL_TABLET | Freq: Once | ORAL | Status: AC
  Administered 2020-12-10: 25 mg via ORAL
  Filled 2020-12-10: qty 1

## 2020-12-10 MED ORDER — LOSARTAN POTASSIUM-HCTZ 100-25 MG PO TABS: 1.0000 | ORAL_TABLET | Freq: Every day | ORAL | 0 refills | Status: DC

## 2020-12-10 NOTE — Telephone Encounter (Signed)
Called to speak with Sharon Melton. Sharon Melton states that she is currently being evaluated at [facility].

## 2020-12-10 NOTE — Telephone Encounter (Signed)
Patient called and she is feeling weird, flutter in chest, shaky, slight headache. No appointments available. Patient was transferred to [facility] at [facility].

## 2020-12-10 NOTE — ED Provider Notes (Signed)
[facility] Emergency Department Provider Note   ____________________________________________   Event Date/Time   First MD Initiated Contact with Patient 12/10/20 1543     (approximate)  I have reviewed the triage vital signs and the nursing notes.   HISTORY  Chief Complaint Weakness, Hypertension, and Dizziness    HPI Sharon Melton is a 60 y.o. female with past medical history of hypertension, hyperlipidemia, diabetes, and CAD who presents to the ED complaining of weakness and dizziness.  Patient reports that she has been feeling generally weak with intermittent lightheadedness for the past 2 weeks.  Symptoms do not seem to be exacerbated or alleviated by anything particular and she denies any focal weakness or numbness.  She has not had any changes in her vision or speech, denies any associated chest pain or shortness of breath.  She has not had any fevers, cough, vomiting, diarrhea, or dysuria.  She states she has not seen a doctor in multiple years and has been off all medications since that time.  She previously took medication for diabetes and hypertension.        Past Medical History:  Diagnosis Date   Diabetes mellitus    Hyperlipidemia    Hypertension    Myocardial infarction [facility]) 02/2007   Inferior STEMI with bare metal stent to the PDA placed at [facility]    Patient Active Problem List   Diagnosis Date Noted   Diabetes mellitus type 2 in nonobese (HCC) 04/19/2018   Elevated liver enzymes 12/22/2017   Vitamin D deficiency 12/22/2017   Diuretic-induced hypokalemia 12/22/2017   Animal scratch 12/22/2017   Diabetic nephropathy associated with type 2 diabetes mellitus (HCC) 04/11/2017   History of tobacco abuse 01/01/2014   Atherosclerosis of 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 Authorizing Provider  carvedilol (COREG) 3.125 MG tablet Take 1 tablet (3.125 mg total) by mouth 2 (two) times daily with a meal. 04/17/18   Sherlene Shams, MD  clopidogrel (PLAVIX) 75 MG tablet Take 1 tablet (75 mg total) by mouth daily. 04/17/18   Sherlene Shams, MD  glipiZIDE (GLUCOTROL) 5 MG tablet Take 1 tablet (5 mg total) by mouth 2 (two) times daily before a meal. 04/17/18   Sherlene Shams, MD  losartan-hydrochlorothiazide (HYZAAR) 100-25 MG tablet Take 1 tablet by mouth daily. 12/10/20   Chesley Noon, MD  metFORMIN (GLUCOPHAGE) 850 MG tablet Take 1 tablet (850 mg total) by mouth 2 (two) times daily with a meal. 12/10/20   Chesley Noon, MD  PARoxetine (PAXIL) 30 MG tablet TAKE 1 TABLET(30 MG) BY MOUTH DAILY 09/10/20   Sherlene Shams, MD  simvastatin (ZOCOR) 80 MG tablet Take 1 tablet (80 mg total) by mouth daily. 04/17/18   Sherlene Shams, MD  traZODone (DESYREL) 50 MG tablet TAKE 1/2 TO 1 TABLET(25 TO 50 MG) BY MOUTH AT BEDTIME AS NEEDED FOR SLEEP 09/10/20   Sherlene Shams, MD    Allergies Patient  has no known allergies.  Family History  Problem Relation Age of Onset   Mental illness Mother    Heart disease Mother    COPD Maternal Uncle    Heart disease Maternal Grandmother        first MI at 41   Hearing loss Maternal Grandfather    Heart disease Paternal Grandfather        85    Social History Social History   Tobacco Use   Smoking status: Former    Types: Cigarettes    Quit date: 02/21/2007    Years since quitting: 13.8   Smokeless tobacco: Never  Substance Use Topics   Alcohol use:  No   Drug use: Yes    Comment: occasional    Review of Systems  Constitutional: No fever/chills.  Positive for generalized weakness. Eyes: No visual changes. ENT: No sore throat. Cardiovascular: Denies chest pain.  Positive for dizziness and lightheadedness. Respiratory: Denies shortness of breath. Gastrointestinal: No abdominal pain.  No nausea, no vomiting.  No diarrhea.  No constipation. Genitourinary: Negative for dysuria. Musculoskeletal: Negative for back pain. Skin: Negative for rash. Neurological: Negative for headaches, focal weakness or numbness.  ____________________________________________   PHYSICAL EXAM:  VITAL SIGNS: ED Triage Vitals  Enc Vitals Group     BP 12/10/20 1534 (!) 221/106     Pulse Rate 12/10/20 1531 75     Resp 12/10/20 1531 20     Temp 12/10/20 1531 98 F (36.7 C)     Temp Source 12/10/20 1531 Oral     SpO2 12/10/20 1531 97 %     Weight 12/10/20 1531 155 lb (70.3 kg)     Height 12/10/20 1531 5\' 5"  (1.651 m)     Head Circumference --      Peak Flow --      Pain Score 12/10/20 1531 0     Pain Loc --      Pain Edu? --      Excl. in GC? --     Constitutional: Alert and oriented. Eyes: Conjunctivae are normal.  Pupils equal, round, and reactive to light bilaterally. Head: Atraumatic. Nose: No congestion/rhinnorhea. Mouth/Throat: Mucous membranes are moist. Neck: Normal ROM Cardiovascular: Normal rate, regular rhythm. Grossly normal heart sounds.  2+ radial pulses bilaterally. Respiratory: Normal respiratory effort.  No retractions. Lungs CTAB. Gastrointestinal: Soft and nontender. No distention. Genitourinary: deferred Musculoskeletal: No lower extremity tenderness nor edema. Neurologic:  Normal speech and language. No gross focal neurologic deficits are appreciated. Skin:  Skin is warm, dry and intact. No rash noted. Psychiatric: Mood and affect are normal. Speech and behavior are  normal.  ____________________________________________   LABS (all labs ordered are listed, but only abnormal results are displayed)  Labs Reviewed  BASIC METABOLIC PANEL - Abnormal; Notable for the following components:      Result Value   Sodium 133 (*)    Potassium 3.2 (*)    CO2 21 (*)    Glucose, Bld 366 (*)    All other components within normal limits  CBC - Abnormal; Notable for the following components:   RBC 6.07 (*)    MCV 74.8 (*)    MCH 23.9 (*)    All other components within normal limits  TROPONIN I (HIGH SENSITIVITY)   ____________________________________________  EKG  ED ECG REPORT I, 12/12/20, the attending physician, personally viewed and interpreted this ECG.   Date: 12/10/2020  EKG Time: 15:35  Rate: 73  Rhythm: normal sinus rhythm  Axis: Normal  Intervals:none  ST&T Change: None   PROCEDURES  Procedure(s) performed (including Critical Care):  Procedures   ____________________________________________   INITIAL IMPRESSION / ASSESSMENT AND PLAN / ED COURSE      60 year old female with past medical story of hypertension, hyperlipidemia, diabetes, and CAD who presents to the ED for feeling of generalized weakness with intermittent lightheadedness for the past 2 weeks.  She has no focal neurologic deficits and overall suspicion for stroke is low, although her symptoms could represent a posterior circulation stroke.  EKG shows no evidence of arrhythmia or ischemia and troponin is negative, low suspicion for ACS or other cardiac etiology.  Labs remarkable for hyperglycemia along with mild hypokalemia, we will hydrate with IV fluids and replete potassium.  Patient noted to be markedly hypertensive, likely related to medication noncompliance.  We will give her previous dose of losartan and hydrochlorothiazide, check CT head.  CT head is negative for acute process, chest x-ray reviewed by me and shows no infiltrate, edema, or effusion.  Patient  reports feeling much better following IV fluids and potassium, no longer feeling weak or dizzy.  Given her improved symptoms, I doubt posterior stroke at this time.  Blood pressure gradually improving following dose of losartan and hydrochlorothiazide.  She is appropriate for discharge home and we will restart her blood pressure medications along with her metformin.  She was counseled to follow-up with PCP and to return to the ED for new worsening symptoms, patient agrees with plan.      ____________________________________________   FINAL CLINICAL IMPRESSION(S) / ED DIAGNOSES  Final diagnoses:  Lightheadedness  Generalized weakness  Primary hypertension  Type 2 diabetes mellitus without complication, without long-term current use of insulin [facility])     ED Discharge Orders          Ordered    losartan-hydrochlorothiazide (HYZAAR) 100-25 MG tablet  Daily        12/10/20 1855    metFORMIN (GLUCOPHAGE) 850 MG tablet  2 times daily with meals        12/10/20 1855             Note:  This document was prepared using Dragon voice recognition software and may include unintentional dictation errors.    Chesley Noon, MD 12/10/20 [phone]

## 2020-12-10 NOTE — ED Notes (Signed)
Patient transported to CT 

## 2020-12-10 NOTE — ED Triage Notes (Signed)
Pt reports has been feeling weak and just over all not right for several weeks now. Pt reports has had intermittent episodes of dizziness as well. Pt denies pain or SOB, states she does not feel right. Pt reports is supposed to be on meds for HTN but lost her insurance a long time ago and stopped taking it.

## 2020-12-22 ENCOUNTER — Other Ambulatory Visit: Payer: Self-pay | Admitting: Internal Medicine

## 2021-04-12 ENCOUNTER — Other Ambulatory Visit: Payer: Self-pay | Admitting: Internal Medicine

## 2021-04-23 ENCOUNTER — Encounter: Payer: Self-pay | Admitting: Internal Medicine

## 2021-04-23 ENCOUNTER — Other Ambulatory Visit: Payer: Self-pay

## 2021-04-23 ENCOUNTER — Ambulatory Visit: Payer: Self-pay | Admitting: Pharmacist

## 2021-04-23 ENCOUNTER — Ambulatory Visit (INDEPENDENT_AMBULATORY_CARE_PROVIDER_SITE_OTHER): Payer: Self-pay | Admitting: Internal Medicine

## 2021-04-23 VITALS — BP 172/98 | HR 82 | Temp 96.2°F | Ht 65.0 in | Wt 146.8 lb

## 2021-04-23 DIAGNOSIS — E1169 Type 2 diabetes mellitus with other specified complication: Secondary | ICD-10-CM

## 2021-04-23 DIAGNOSIS — E1121 Type 2 diabetes mellitus with diabetic nephropathy: Secondary | ICD-10-CM

## 2021-04-23 DIAGNOSIS — I70213 Atherosclerosis of native arteries of extremities with intermittent claudication, bilateral legs: Secondary | ICD-10-CM

## 2021-04-23 DIAGNOSIS — I2510 Atherosclerotic heart disease of native coronary artery without angina pectoris: Secondary | ICD-10-CM

## 2021-04-23 DIAGNOSIS — R748 Abnormal levels of other serum enzymes: Secondary | ICD-10-CM

## 2021-04-23 DIAGNOSIS — E785 Hyperlipidemia, unspecified: Secondary | ICD-10-CM

## 2021-04-23 DIAGNOSIS — Z599 Problem related to housing and economic circumstances, unspecified: Secondary | ICD-10-CM

## 2021-04-23 DIAGNOSIS — I10 Essential (primary) hypertension: Secondary | ICD-10-CM

## 2021-04-23 DIAGNOSIS — K759 Inflammatory liver disease, unspecified: Secondary | ICD-10-CM

## 2021-04-23 DIAGNOSIS — E1165 Type 2 diabetes mellitus with hyperglycemia: Secondary | ICD-10-CM

## 2021-04-23 DIAGNOSIS — F411 Generalized anxiety disorder: Secondary | ICD-10-CM

## 2021-04-23 LAB — POCT GLYCOSYLATED HEMOGLOBIN (HGB A1C): Hemoglobin A1C: 11.4 % — AB (ref 4.0–5.6)

## 2021-04-23 MED ORDER — ATORVASTATIN CALCIUM 20 MG PO TABS
20.0000 mg | ORAL_TABLET | Freq: Every day | ORAL | 1 refills | Status: DC
  Filled 2021-04-23: qty 30, 30d supply, fill #0

## 2021-04-23 MED ORDER — PAROXETINE HCL 30 MG PO TABS
30.0000 mg | ORAL_TABLET | Freq: Every day | ORAL | 1 refills | Status: DC
  Filled 2021-04-23: qty 90, 90d supply, fill #0

## 2021-04-23 MED ORDER — INSULIN PEN NEEDLE 32G X 4 MM MISC
3 refills | Status: DC
  Filled 2021-04-23: qty 100, fill #0
  Filled 2021-05-12: qty 100, 100d supply, fill #0

## 2021-04-23 MED ORDER — CARVEDILOL 3.125 MG PO TABS
3.1250 mg | ORAL_TABLET | Freq: Two times a day (BID) | ORAL | 1 refills | Status: DC
  Filled 2021-04-23: qty 60, 30d supply, fill #0

## 2021-04-23 MED ORDER — AMLODIPINE BESYLATE 2.5 MG PO TABS
2.5000 mg | ORAL_TABLET | Freq: Every day | ORAL | 1 refills | Status: DC
  Filled 2021-04-23: qty 30, 30d supply, fill #0

## 2021-04-23 MED ORDER — RIGHTEST GL300 LANCETS MISC
0 refills | Status: AC
  Filled 2021-04-23: qty 100, 25d supply, fill #0

## 2021-04-23 MED ORDER — TRAZODONE HCL 50 MG PO TABS
25.0000 mg | ORAL_TABLET | Freq: Every evening | ORAL | 1 refills | Status: DC | PRN
  Filled 2021-04-23: qty 13, 13d supply, fill #0

## 2021-04-23 MED ORDER — METFORMIN HCL 500 MG PO TABS
500.0000 mg | ORAL_TABLET | Freq: Two times a day (BID) | ORAL | 3 refills | Status: DC
  Filled 2021-04-23: qty 60, 30d supply, fill #0

## 2021-04-23 MED ORDER — RIGHTEST GS550 BLOOD GLUCOSE VI STRP
ORAL_STRIP | 0 refills | Status: AC
  Filled 2021-04-23: qty 100, 25d supply, fill #0

## 2021-04-23 MED ORDER — CLOPIDOGREL BISULFATE 75 MG PO TABS
75.0000 mg | ORAL_TABLET | Freq: Every day | ORAL | 1 refills | Status: DC
  Filled 2021-04-23: qty 30, 30d supply, fill #0

## 2021-04-23 MED ORDER — BLOOD GLUCOSE MONITOR KIT
PACK | 0 refills | Status: AC
  Filled 2021-04-23: qty 1, 30d supply, fill #0

## 2021-04-23 MED ORDER — TRESIBA FLEXTOUCH 100 UNIT/ML ~~LOC~~ SOPN: 20.0000 [IU] | PEN_INJECTOR | Freq: Every day | SUBCUTANEOUS | 2 refills | Status: DC

## 2021-04-23 NOTE — Chronic Care Management (AMB) (Signed)
Chronic Care Management CCM Pharmacy Note  04/23/2021 Name:  Sharon Melton MRN:  [id number] DOB:  03/01/1961  Summary: - Uninsured. Cannot afford medications.   Recommendations/Changes made from today's visit: - Collaborated with Medication Management CLinic  Subjective: Sharon Melton is an 60 y.o. year old female who is a primary patient of Tullo, Mar Daring, MD.  The CCM team was consulted for assistance with disease management and care coordination needs.    Engaged with patient face to face for initial visit for pharmacy case management and/or care coordination services.   Objective:  Medications Reviewed Today     Reviewed by Sandy Salaam, CMA (Certified Medical Assistant) on 04/23/21 at 1439  Med List Status: <None>   Medication Order Taking? Sig Documenting Provider Last Dose Status Informant  carvedilol (COREG) 3.125 MG tablet [id number] No Take 1 tablet (3.125 mg total) by mouth 2 (two) times daily with a meal.  Patient not taking: Reported on 04/23/2021   Sherlene Shams, MD Not Taking Active   clopidogrel (PLAVIX) 75 MG tablet [id number] No Take 1 tablet (75 mg total) by mouth daily.  Patient not taking: Reported on 04/23/2021   Sherlene Shams, MD Not Taking Active   glipiZIDE (GLUCOTROL) 5 MG tablet [id number] No Take 1 tablet (5 mg total) by mouth 2 (two) times daily before a meal.  Patient not taking: Reported on 04/23/2021   Sherlene Shams, MD Not Taking Active   losartan-hydrochlorothiazide [facility]) 100-25 MG tablet [id number] No Take 1 tablet by mouth daily.  Patient not taking: Reported on 04/23/2021   Chesley Noon, MD Not Taking Active   metFORMIN (GLUCOPHAGE) 850 MG tablet [id number] No Take 1 tablet (850 mg total) by mouth 2 (two) times daily with a meal.  Patient not taking: Reported on 04/23/2021   Chesley Noon, MD Not Taking Active   PARoxetine (PAXIL) 30 MG tablet [id number] Yes TAKE 1 TABLET(30 MG) BY MOUTH DAILY Sherlene Shams, MD Taking  Active   simvastatin (ZOCOR) 80 MG tablet [id number] No Take 1 tablet (80 mg total) by mouth daily.  Patient not taking: Reported on 04/23/2021   Sherlene Shams, MD Not Taking Active   traZODone (DESYREL) 50 MG tablet [id number] Yes TAKE 1/2 TO 1 TABLET(25 TO 50 MG) BY MOUTH AT BEDTIME AS NEEDED FOR SLEEP Sherlene Shams, MD Taking Active             Pertinent Labs:   Lab Results  Component Value Date   HGBA1C 11.4 (A) 04/23/2021   Lab Results  Component Value Date   CHOL 150 12/20/2017   HDL 45.90 12/20/2017   LDLCALC 73 12/20/2017   LDLDIRECT 164.0 07/22/2016   TRIG 152.0 (H) 12/20/2017   CHOLHDL 3 12/20/2017   Lab Results  Component Value Date   CREATININE 0.73 12/10/2020   BUN 13 12/10/2020   NA 133 (L) 12/10/2020   K 3.2 (L) 12/10/2020   CL 102 12/10/2020   CO2 21 (L) 12/10/2020    SDOH:  (Social Determinants of Health) assessments and interventions performed:  SDOH Interventions    Flowsheet Row Most Recent Value  SDOH Interventions   Financial Strain Interventions Other (Comment)  [medication management clinic]       CCM Care Plan  Review of patient past medical history, allergies, medications, health status, including review of consultants reports, laboratory and other test data, was performed as part of comprehensive evaluation and provision of chronic care management services.   Care  Plan : Medication Management  Updates made by Lourena Simmonds, RPH-CPP since 04/23/2021 12:00 AM     Problem: T2DM, HTN      Long-Range Goal: Disease Progression Prevention   Start Date: 04/23/2021  This Visit's Progress: On track  Priority: High  Note:   Current Barriers:  Unable to independently afford treatment regimen   Pharmacist Clinical Goal(s):  patient will verbalize ability to afford treatment regimen through collaboration with PharmD and provider.    Interventions: 1:1 collaboration with Sherlene Shams, MD regarding development and update of  comprehensive plan of care as evidenced by provider attestation and co-signature Inter-disciplinary care team collaboration (see longitudinal plan of care) Comprehensive medication review performed; medication list updated in electronic medical record  Medication Management: Uninsured. Collaboration with Medication Management Clinic to support medication access  Diabetes:  Uncontrolled; current treatment: none; was prescribed metformin and glipizide ;  Current glucose readings: not checking Discussed with PCP. Restart metformin 500 mg BID. Start Tresiba 15 units daily. Sample provided. Will follow.   Hypertension:   Uncontrolled; current treatment: prescribed losartan/HCTZ 50/12.5, carvedilol 3.125 mg BID, amlodipine 2.5 mg daily, not taking;  Restart medications. Collaborate with Medication Management Clinic  Hyperlipidemia:   Uncontrolled; current treatment: atorvastatin 20 mg daily - not taking  Antiplatelet regimen: clopidogrel 75 mg daily ;  Restart medications. Collaborated with Medication Management Clinic  Depression/Anxiety:   Uncontrolled/; current treatment: paroxetine 30 mg daily, trazodone 50 mg daily ;  Continue current regimen. Collaborate with Medication Management Clinic   Patient Goals/Self-Care Activities patient will:  - collaborate with provider on medication access solutions       Plan: Telephone follow up appointment with care management team member scheduled for:  1 week  Catie Feliz Beam, PharmD, [place], CPP Clinical Pharmacist [employer] at [employer] [phone]

## 2021-04-23 NOTE — Patient Instructions (Addendum)
We are going to resume ALL of your previous medications but we will be adding [place] .    This is insulin  and is taken once daily   Check sugars 2 times daily:  1) fasting and 2) 2 hours after a meal  (one meal daily) and send readingsto office   We will set you up with Catie for followup in 2 weeks

## 2021-04-23 NOTE — Patient Instructions (Signed)
Lonnette,   I will call you next week to check in. Thanks!  Catie Feliz Beam, PharmD

## 2021-04-23 NOTE — Progress Notes (Addendum)
Subjective:  Patient ID: Sharon Melton, female    DOB: 16-Jul-1960  Age: 60 y.o. MRN: [id number]  CC: The primary encounter diagnosis was Essential hypertension. Diagnoses of Hyperlipidemia with target LDL less than 70, Diabetic nephropathy associated with type 2 diabetes mellitus ([place]), Financial difficulties, Atherosclerosis of native artery of both lower extremities with intermittent claudication ([place]), Coronary artery disease involving native coronary artery of native heart without angina pectoris, Hyperlipidemia associated with type 2 diabetes mellitus ([place]), Generalized anxiety disorder, Elevated liver enzymes, and Hepatitis were also pertinent to this visit.  HPI Sharon Melton presents follow up .  Has been lost to follow up for 3 years due to loss of insurance   Chief Complaint  Patient presents with   Follow-up    Follow up    This visit occurred during the SARS-CoV-2 public health emergency.  Safety protocols were in place, including screening questions prior to the visit, additional usage of staff PPE, and extensive cleaning of exam room while observing appropriate contact time as indicated for disinfecting solutions.   Quit working at [employer] due to the stress of being yelled at in front of customers.applying for SS    Denies chest pain, but has dyspnea with housework and feels dizzy with sudden changes in position  She has had an unintentional weight loss oaf 14 lb ws since 0086  Marital conflict.  Daughter left the home bc of husband , took the grandchildren with her .  Living with her ex boyfriend's father .  Daughter is bipolar, ADD.Marland Kitchen  ex boyfriend was recently convicted of attempted murder of her  daughter and awaiting sentence in jail.   No smoking.  Averages 1-2 drinks per night am    Outpatient Medications Prior to Visit  Medication Sig Dispense Refill   PARoxetine (PAXIL) 30 MG tablet TAKE 1 TABLET(30 MG) BY MOUTH DAILY 90 tablet 0   traZODone (DESYREL) 50  MG tablet TAKE 1/2 TO 1 TABLET(25 TO 50 MG) BY MOUTH AT BEDTIME AS NEEDED FOR SLEEP 90 tablet 0   losartan-hydrochlorothiazide (HYZAAR) 100-25 MG tablet Take 1 tablet by mouth daily. (Patient not taking: Reported on 04/23/2021) 90 tablet 0   carvedilol (COREG) 3.125 MG tablet Take 1 tablet (3.125 mg total) by mouth 2 (two) times daily with a meal. (Patient not taking: Reported on 04/23/2021) 60 tablet 11   clopidogrel (PLAVIX) 75 MG tablet Take 1 tablet (75 mg total) by mouth daily. (Patient not taking: Reported on 04/23/2021) 30 tablet 11   glipiZIDE (GLUCOTROL) 5 MG tablet Take 1 tablet (5 mg total) by mouth 2 (two) times daily before a meal. (Patient not taking: Reported on 04/23/2021) 180 tablet 3   metFORMIN (GLUCOPHAGE) 850 MG tablet Take 1 tablet (850 mg total) by mouth 2 (two) times daily with a meal. (Patient not taking: Reported on 04/23/2021) 60 tablet 1   simvastatin (ZOCOR) 80 MG tablet Take 1 tablet (80 mg total) by mouth daily. (Patient not taking: Reported on 04/23/2021) 30 tablet 11   No facility-administered medications prior to visit.    Review of Systems;  Patient denies headache, fevers, malaise, unintentional weight loss, skin rash, eye pain, sinus congestion and sinus pain, sore throat, dysphagia,  hemoptysis , cough, dyspnea, wheezing, chest pain, palpitations, orthopnea, edema, abdominal pain, nausea, melena, diarrhea, constipation, flank pain, dysuria, hematuria, urinary  Frequency, nocturia, numbness, tingling, seizures,  Focal weakness, Loss of consciousness,  Tremor, insomnia, depression, anxiety, and suicidal ideation.      Objective:  BP (!) 172/98 (BP Location: Left Arm, Patient Position: Sitting, Cuff Size: Normal)   Pulse 82   Temp (!) 96.2 F (35.7 C) (Temporal)   Ht $R'5\' 5"'NK$  (1.651 m)   Wt 146 lb 12.8 oz (66.6 kg)   SpO2 99%   BMI 24.43 kg/m   BP Readings from Last 3 Encounters:  04/23/21 (!) 172/98  12/10/20 (!) 183/91  04/17/18 (!) 182/88    Wt Readings  from Last 3 Encounters:  04/23/21 146 lb 12.8 oz (66.6 kg)  12/10/20 155 lb (70.3 kg)  04/17/18 159 lb (72.1 kg)    General appearance: alert, cooperative and appears stated age Ears: normal TM's and external ear canals both ears Throat: lips, mucosa, and tongue normal; teeth and gums normal Neck: no adenopathy, no carotid bruit, supple, symmetrical, trachea midline and thyroid not enlarged, symmetric, no tenderness/mass/nodules Back: symmetric, no curvature. ROM normal. No CVA tenderness. Lungs: clear to auscultation bilaterally Heart: regular rate and rhythm, S1, S2 normal, no murmur, click, rub or gallop Abdomen: soft, non-tender; bowel sounds normal; no masses,  no organomegaly Pulses: 2+ and symmetric Skin: Skin color, texture, turgor normal. No rashes or lesions Lymph nodes: Cervical, supraclavicular, and axillary nodes normal.  Lab Results  Component Value Date   HGBA1C 11.4 (A) 04/23/2021   HGBA1C 6.4 (A) 04/17/2018   HGBA1C 8.2 (A) 12/20/2017    Lab Results  Component Value Date   CREATININE 0.93 04/23/2021   CREATININE 0.73 12/10/2020   CREATININE 0.78 04/16/2018    Lab Results  Component Value Date   WBC 7.5 12/10/2020   HGB 14.5 12/10/2020   HCT 45.4 12/10/2020   PLT 270 12/10/2020   GLUCOSE 329 (H) 04/23/2021   CHOL 300 (H) 04/23/2021   TRIG 320 (H) 04/23/2021   HDL 59 04/23/2021   LDLDIRECT 164.0 07/22/2016   LDLCALC 187 (H) 04/23/2021   ALT 81 (H) 04/23/2021   AST 342 (H) 04/23/2021   NA 133 (L) 04/23/2021   K 4.0 04/23/2021   CL 94 (L) 04/23/2021   CREATININE 0.93 04/23/2021   BUN 12 04/23/2021   CO2 22 04/23/2021   TSH 0.87 12/07/2012   HGBA1C 11.4 (A) 04/23/2021   MICROALBUR 5.5 04/23/2021    DG Chest 2 View  Result Date: 12/10/2020 CLINICAL DATA:  Weakness. EXAM: CHEST - 2 VIEW COMPARISON:  None. FINDINGS: Enlarged cardiac silhouette. Streaky left basilar opacities. No confluent consolidation. No visible pleural effusions or pneumothorax.  Mild to moderate multilevel thoracic degenerative disc height loss. Osteopenia. IMPRESSION: 1. Streaky left basilar opacities, favor atelectasis. 2. Cardiomegaly. Electronically Signed   By: Margaretha Sheffield MD   On: 12/10/2020 17:45   CT Head Wo Contrast  Result Date: 12/10/2020 CLINICAL DATA:  Neuro deficit, acute stroke suspected. EXAM: CT HEAD WITHOUT CONTRAST TECHNIQUE: Contiguous axial images were obtained from the base of the skull through the vertex without intravenous contrast. COMPARISON:  None. FINDINGS: Brain: No evidence of acute large vascular territory infarction, hemorrhage, hydrocephalus, extra-axial collection or mass lesion/mass effect. Small hypodensity in the inferior right basal ganglia, most likely a dilated perivascular space. Vascular: Calcific atherosclerosis. No hyperdense vessel identified. Skull: No acute fracture. Sinuses/Orbits: Visualized sinuses are clear. Unremarkable visualized orbits. Other: No mastoid effusions. IMPRESSION: No evidence of acute intracranial abnormality. Electronically Signed   By: Margaretha Sheffield MD   On: 12/10/2020 17:09    Assessment & Plan:   Problem List Items Addressed This Visit     Atherosclerosis of native artery of extremity  with intermittent claudication (HCC)    Resume statin and asa       Relevant Medications   carvedilol (COREG) 3.125 MG tablet   atorvastatin (LIPITOR) 20 MG tablet   amLODipine (NORVASC) 2.5 MG tablet   traZODone (DESYREL) 50 MG tablet   PARoxetine (PAXIL) 30 MG tablet   Coronary artery disease    She has deferred vascular evaluation .  Resume statin therapy beta blocker, and plavix . Marland Kitchen She is not smoking. Lab Results  Component Value Date   CHOL 300 (H) 04/23/2021   HDL 59 04/23/2021   LDLCALC 187 (H) 04/23/2021   LDLDIRECT 164.0 07/22/2016   TRIG 320 (H) 04/23/2021   CHOLHDL 5.1 (H) 04/23/2021   Lab Results  Component Value Date   ALT 81 (H) 04/23/2021   AST 342 (H) 04/23/2021   ALKPHOS 52  12/20/2017   BILITOT 0.7 04/23/2021         Relevant Medications   carvedilol (COREG) 3.125 MG tablet   atorvastatin (LIPITOR) 20 MG tablet   amLODipine (NORVASC) 2.5 MG tablet   Elevated liver enzymes    Liver enzyme elevation ina 2:1 pattern of ASL> ALT continues to suggest alcohol use.  Lab Results  Component Value Date   ALT 81 (H) 04/23/2021   AST 342 (H) 04/23/2021   ALKPHOS 52 12/20/2017   BILITOT 0.7 04/23/2021         Essential hypertension - Primary    Uncontrolled due to lapse in medications due to loss of insurance.  She has been referred to CCM and medication assistance will provide all medications.  Lab Results  Component Value Date   MICROALBUR 5.5 04/23/2021   MICROALBUR <0.7 12/20/2017           Relevant Medications   carvedilol (COREG) 3.125 MG tablet   atorvastatin (LIPITOR) 20 MG tablet   amLODipine (NORVASC) 2.5 MG tablet   Other Relevant Orders   Comp Met (CMET) (Completed)   AMB Referral to [place]   Urine Microalbumin w/creat. ratio (Completed)   Generalized anxiety disorder    Continue paxil 30 mg daily and trazodone  prn insomnia       Relevant Medications   traZODone (DESYREL) 50 MG tablet   PARoxetine (PAXIL) 30 MG tablet   Hepatitis    Recurrent  With prior screening for viral etiologies negative and  pattern of elevation suggesting alcohol abuse.  Recheck in 2 weeks. .  Lab Results  Component Value Date   ALT 81 (H) 04/23/2021   AST 342 (H) 04/23/2021   ALKPHOS 52 12/20/2017   BILITOT 0.7 04/23/2021         Relevant Orders   Comprehensive metabolic panel   Hyperlipidemia associated with type 2 diabetes mellitus ([place])    Uncontrolled diabetes due to being lost to follow up since 2019 due to loss of insurance .  Today we  Are starting Tresiba 20 units daily .  Referring to CCM for medication management .  She will also need to start an ARBat followup ,  Given new onset microalmubinuria   Lab Results   Component Value Date   HGBA1C 11.4 (A) 04/23/2021   Lab Results  Component Value Date   MICROALBUR 5.5 04/23/2021   MICROALBUR <0.7 12/20/2017     Lab Results  Component Value Date   CHOL 300 (H) 04/23/2021   HDL 59 04/23/2021   LDLCALC 187 (H) 04/23/2021   LDLDIRECT 164.0 07/22/2016   TRIG 320 (H) 04/23/2021  CHOLHDL 5.1 (H) 04/23/2021         Relevant Medications   carvedilol (COREG) 3.125 MG tablet   atorvastatin (LIPITOR) 20 MG tablet   amLODipine (NORVASC) 2.5 MG tablet   insulin degludec (TRESIBA FLEXTOUCH) 100 UNIT/ML FlexTouch Pen   metFORMIN (GLUCOPHAGE) 500 MG tablet   Hyperlipidemia with target LDL less than 70   Relevant Medications   carvedilol (COREG) 3.125 MG tablet   atorvastatin (LIPITOR) 20 MG tablet   amLODipine (NORVASC) 2.5 MG tablet   Other Relevant Orders   Lipid Profile (Completed)   AMB Referral to [place]   Other Visit Diagnoses     Financial difficulties       Relevant Orders   AMB Referral to [place]       I have discontinued Sharon Melton carvedilol, simvastatin, clopidogrel, metFORMIN, PARoxetine, and traZODone. I am also having her start on carvedilol, atorvastatin, amLODipine, clopidogrel, traZODone, Tresiba FlexTouch, PARoxetine, metFORMIN, BD Pen Needle Nano U/F, and blood glucose meter kit and supplies. Additionally, I am having her maintain her losartan-hydrochlorothiazide.  Meds ordered this encounter  Medications   carvedilol (COREG) 3.125 MG tablet    Sig: Take 1 tablet (3.125 mg total) by mouth 2 (two) times daily with a meal.    Dispense:  180 tablet    Refill:  1   atorvastatin (LIPITOR) 20 MG tablet    Sig: Take 1 tablet (20 mg total) by mouth once daily.    Dispense:  90 tablet    Refill:  1   amLODipine (NORVASC) 2.5 MG tablet    Sig: Take 1 tablet (2.5 mg total) by mouth once daily.    Dispense:  90 tablet    Refill:  1   clopidogrel (PLAVIX) 75 MG tablet    Sig:  Take 1 tablet (75 mg total) by mouth once daily.    Dispense:  90 tablet    Refill:  1   traZODone (DESYREL) 50 MG tablet    Sig: Take (1/2) to 1 tablet (25-50 mg total) by mouth once daily at bedtime as needed for sleep.    Dispense:  90 tablet    Refill:  1   insulin degludec (TRESIBA FLEXTOUCH) 100 UNIT/ML FlexTouch Pen    Sig: Inject 20 Units into the skin daily.    Dispense:  3 mL    Refill:  2    Sample pen given in office   PARoxetine (PAXIL) 30 MG tablet    Sig: Take 1 tablet (30 mg total) by mouth once daily.    Dispense:  90 tablet    Refill:  1   metFORMIN (GLUCOPHAGE) 500 MG tablet    Sig: Take 1 tablet (500 mg total) by mouth 2 (two) times daily with a meal.    Dispense:  180 tablet    Refill:  3   Insulin Pen Needle (BD PEN NEEDLE NANO U/F) 32G X 4 MM MISC    Sig: Use once daily for insulin administration    Dispense:  100 each    Refill:  3   blood glucose meter kit and supplies KIT    Sig: Use as directed to check blood sugar up to four times daily.    Dispense:  1 each    Refill:  0    Order Specific Question:   Number of strips    Answer:   120    Order Specific Question:   Number of lancets  Answer:   120    Medications Discontinued During This Encounter  Medication Reason   carvedilol (COREG) 3.125 MG tablet    clopidogrel (PLAVIX) 75 MG tablet    traZODone (DESYREL) 50 MG tablet    PARoxetine (PAXIL) 30 MG tablet    simvastatin (ZOCOR) 80 MG tablet    metFORMIN (GLUCOPHAGE) 850 MG tablet     Follow-up: Return in about 4 weeks (around 05/21/2021).   Crecencio Mc, MD

## 2021-04-24 LAB — LIPID PANEL
Cholesterol: 300 mg/dL — ABNORMAL HIGH (ref <200)
HDL: 59 mg/dL (ref >=50)
LDL Cholesterol (Calc): 187 mg/dL (calc) — ABNORMAL HIGH
Non-HDL Cholesterol (Calc): 241 mg/dL (calc) — ABNORMAL HIGH (ref <130)
Total CHOL/HDL Ratio: 5.1 (calc) — ABNORMAL HIGH (ref <5.0)
Triglycerides: 320 mg/dL — ABNORMAL HIGH (ref <150)

## 2021-04-24 LAB — COMPREHENSIVE METABOLIC PANEL
AG Ratio: 1.7 (calc) (ref 1.0–2.5)
ALT: 81 U/L — ABNORMAL HIGH (ref 6–29)
AST: 342 U/L — ABNORMAL HIGH (ref 10–35)
Albumin: 4.7 g/dL (ref 3.6–5.1)
Alkaline phosphatase (APISO): 96 U/L (ref 37–153)
BUN: 12 mg/dL (ref 7–25)
CO2: 22 mmol/L (ref 20–32)
Calcium: 10.3 mg/dL (ref 8.6–10.4)
Chloride: 94 mmol/L — ABNORMAL LOW (ref 98–110)
Creat: 0.93 mg/dL (ref 0.50–1.05)
Globulin: 2.8 g/dL (calc) (ref 1.9–3.7)
Glucose, Bld: 329 mg/dL — ABNORMAL HIGH (ref 65–99)
Potassium: 4 mmol/L (ref 3.5–5.3)
Sodium: 133 mmol/L — ABNORMAL LOW (ref 135–146)
Total Bilirubin: 0.7 mg/dL (ref 0.2–1.2)
Total Protein: 7.5 g/dL (ref 6.1–8.1)

## 2021-04-24 LAB — MICROALBUMIN / CREATININE URINE RATIO
Creatinine, Urine: 62 mg/dL (ref 20–275)
Microalb Creat Ratio: 89 mcg/mg creat — ABNORMAL HIGH (ref <30)
Microalb, Ur: 5.5 mg/dL

## 2021-04-25 DIAGNOSIS — K7010 Alcoholic hepatitis without ascites: Secondary | ICD-10-CM | POA: Insufficient documentation

## 2021-04-25 DIAGNOSIS — K759 Inflammatory liver disease, unspecified: Secondary | ICD-10-CM | POA: Insufficient documentation

## 2021-04-25 NOTE — Assessment & Plan Note (Addendum)
Uncontrolled diabetes due to being lost to follow up since 2019 due to loss of insurance .  Today we  Are starting Tresiba 20 units daily .  Referring to CCM for medication management .  She will also need to start an ARBat followup ,  Given new onset microalmubinuria   Lab Results  Component Value Date   HGBA1C 11.4 (A) 04/23/2021   Lab Results  Component Value Date   MICROALBUR 5.5 04/23/2021   MICROALBUR <0.7 12/20/2017     Lab Results  Component Value Date   CHOL 300 (H) 04/23/2021   HDL 59 04/23/2021   LDLCALC 187 (H) 04/23/2021   LDLDIRECT 164.0 07/22/2016   TRIG 320 (H) 04/23/2021   CHOLHDL 5.1 (H) 04/23/2021

## 2021-04-25 NOTE — Assessment & Plan Note (Signed)
Liver enzyme elevation ina 2:1 pattern of ASL> ALT continues to suggest alcohol use.  Lab Results  Component Value Date   ALT 81 (H) 04/23/2021   AST 342 (H) 04/23/2021   ALKPHOS 52 12/20/2017   BILITOT 0.7 04/23/2021

## 2021-04-25 NOTE — Addendum Note (Signed)
Addended by: Sherlene Shams on: 04/25/2021 01:54 PM   Modules accepted: Orders

## 2021-04-25 NOTE — Assessment & Plan Note (Addendum)
Uncontrolled due to lapse in medications due to loss of insurance.  She has been referred to CCM and medication assistance will provide all medications.  Lab Results  Component Value Date   MICROALBUR 5.5 04/23/2021   MICROALBUR <0.7 12/20/2017

## 2021-04-25 NOTE — Assessment & Plan Note (Signed)
She has deferred vascular evaluation .  Resume statin therapy beta blocker, and plavix . Marland Kitchen She is not smoking. Lab Results  Component Value Date   CHOL 300 (H) 04/23/2021   HDL 59 04/23/2021   LDLCALC 187 (H) 04/23/2021   LDLDIRECT 164.0 07/22/2016   TRIG 320 (H) 04/23/2021   CHOLHDL 5.1 (H) 04/23/2021   Lab Results  Component Value Date   ALT 81 (H) 04/23/2021   AST 342 (H) 04/23/2021   ALKPHOS 52 12/20/2017   BILITOT 0.7 04/23/2021

## 2021-04-25 NOTE — Assessment & Plan Note (Signed)
Continue paxil 30 mg daily and trazodone  prn insomnia

## 2021-04-25 NOTE — Assessment & Plan Note (Signed)
Resume statin and asa

## 2021-04-25 NOTE — Assessment & Plan Note (Addendum)
Recurrent  With prior screening for viral etiologies negative and  pattern of elevation suggesting alcohol abuse.  Recheck in 2 weeks. .  Lab Results  Component Value Date   ALT 81 (H) 04/23/2021   AST 342 (H) 04/23/2021   ALKPHOS 52 12/20/2017   BILITOT 0.7 04/23/2021

## 2021-04-26 ENCOUNTER — Telehealth: Payer: Self-pay

## 2021-04-26 NOTE — Chronic Care Management (AMB) (Signed)
  Care Management   Note  04/26/2021 Name: LUA FENG MRN: [id number] DOB: [birth year]  CLETUS MEHLHOFF is a 60 y.o. year old female who is a primary care patient of Darrick Huntsman, Mar Daring, MD. I reached out to Greggory Brandy by phone today in response to a referral sent by Ms. Rhea Pink Earwood's primary care provider.   Ms. Pence was given information about care management services today including:  Care management services include personalized support from designated clinical staff supervised by her physician, including individualized plan of care and coordination with other care providers 24/7 contact phone numbers for assistance for urgent and routine care needs. The patient may stop care management services at any time by phone call to the office staff.  Patient agreed to services and verbal consent obtained.   Follow up plan: Telephone appointment with care management team member scheduled for:05/05/2021  Penne Lash, RMA Care Guide, Embedded Care Coordination [facility]  [place], [place] [address] Direct Dial: [phone] [email or web address] Website: [email or web address]

## 2021-05-05 ENCOUNTER — Telehealth: Payer: Self-pay | Admitting: *Deleted

## 2021-05-05 ENCOUNTER — Telehealth: Payer: Self-pay

## 2021-05-05 NOTE — Chronic Care Management (AMB) (Signed)
°  Care Management   Note  05/05/2021 Name: Sharon Melton MRN: [id number] DOB: 1961-02-06  MONYA KOZAKIEWICZ is a 60 y.o. year old female who is a primary care patient of Sherlene Shams, MD and is actively engaged with the care management team. I reached out to Greggory Brandy by phone today to assist with re-scheduling a follow up visit with the Pharmacist  Follow up plan: Unsuccessful telephone outreach attempt made. A HIPAA compliant phone message was left for the patient providing contact information and requesting a return call.  The care management team will reach out to the patient again over the next 5 days.  If patient returns call to provider office, please advise to call Embedded Care Management Care Guide Penne Lash  at [phone]  Penne Lash, RMA Care Guide, Embedded Care Coordination [facility]  [place], [place] [address] Direct Dial: [phone] [email or web address]: [email or web address]

## 2021-05-05 NOTE — Telephone Encounter (Signed)
°  Care Management   Follow Up Note   05/05/2021 Name: KIYRA SLAUBAUGH MRN: [id number] DOB: 31-Aug-1960   Referred by: Sherlene Shams, MD  Reason for referral : Chronic Care Management in Patient with Hypertension, Coronary Artery Disease, Hyperlipidemia Associated with Type II Diabetes Mellitus, Obesity, and Generalized Anxiety Disorder.  An unsuccessful telephone outreach was attempted today. The patient was referred to the case management team for assistance with care management and care coordination. A HIPAA compliant message was left on voicemail, providing contact information, encouraging patient to return LCSW's call at her earliest convenience.  LCSW will make a second initial telephone outreach call within the next 5-7 business days, if a return call is not received from patient in the meantime.  Follow-Up Plan:  Request placed to Scheduling Care Guides to reschedule patient's initial telephone outreach call with LCSW.  Danford Bad LCSW Licensed Clinical Social Worker [facility]  [phone]

## 2021-05-10 NOTE — Chronic Care Management (AMB) (Signed)
°  Care Management   Note  05/10/2021 Name: Sharon Melton MRN: [id number] DOB: Jul 02, 1960  Sharon Melton is a 60 y.o. year old female who is a primary care patient of Darrick Huntsman, Mar Daring, MD and is actively engaged with the care management team. I reached out to Greggory Brandy by phone today to assist with re-scheduling a follow up visit with the Pharmacist  Follow up plan: Telephone appointment with care management team member scheduled for:06/09/2021  Penne Lash, RMA Care Guide, Embedded Care Coordination [facility]  [place], [place] [address] Direct Dial: [phone] [email or web address] Website: [email or web address]

## 2021-05-11 ENCOUNTER — Other Ambulatory Visit: Payer: Self-pay

## 2021-05-11 ENCOUNTER — Other Ambulatory Visit (INDEPENDENT_AMBULATORY_CARE_PROVIDER_SITE_OTHER): Payer: Self-pay

## 2021-05-11 ENCOUNTER — Telehealth: Payer: Self-pay | Admitting: Pharmacist

## 2021-05-11 DIAGNOSIS — R748 Abnormal levels of other serum enzymes: Secondary | ICD-10-CM

## 2021-05-11 DIAGNOSIS — E1165 Type 2 diabetes mellitus with hyperglycemia: Secondary | ICD-10-CM

## 2021-05-11 DIAGNOSIS — K759 Inflammatory liver disease, unspecified: Secondary | ICD-10-CM

## 2021-05-11 MED ORDER — TRESIBA FLEXTOUCH 100 UNIT/ML ~~LOC~~ SOPN
20.0000 [IU] | PEN_INJECTOR | Freq: Every day | SUBCUTANEOUS | 2 refills | Status: DC
Start: 2021-05-11 — End: 2021-07-23
  Filled 2021-05-11: qty 15, 75d supply, fill #0

## 2021-05-11 NOTE — Telephone Encounter (Signed)
Received the following message: "Pt came for labs & told the front desk that she was running low on her insulin. She states she was given a sample here last time. I had her have a sit in the waiting room until we could figure out what she needs" [id number] Patient confirms [facility] but that they did not fill [place]. Refill sent. Encouraged to follow up with them about enrollment paperwork  Medication Samples have been provided to the patient.  Drug name: Evaristo Bury       Strength: U100        Qty: 1 pen  LOT: [id number]  Exp.Date: 04/2022  Dosing instructions: Inject 20 units daily  The patient has been instructed regarding the correct time, dose, and frequency of taking this medication, including desired effects and most common side effects.   Lourena Simmonds 3:58 PM 05/11/2021

## 2021-05-12 ENCOUNTER — Other Ambulatory Visit: Payer: Self-pay

## 2021-05-12 LAB — COMPREHENSIVE METABOLIC PANEL
ALT: 26 U/L (ref 0–35)
AST: 77 U/L — ABNORMAL HIGH (ref 0–37)
Albumin: 4.3 g/dL (ref 3.5–5.2)
Alkaline Phosphatase: 105 U/L (ref 39–117)
BUN: 14 mg/dL (ref 6–23)
CO2: 30 mEq/L (ref 19–32)
Calcium: 10.5 mg/dL (ref 8.4–10.5)
Chloride: 95 mEq/L — ABNORMAL LOW (ref 96–112)
Creatinine, Ser: 0.86 mg/dL (ref 0.40–1.20)
GFR: 72.55 mL/min (ref >60.00)
Glucose, Bld: 137 mg/dL — ABNORMAL HIGH (ref 70–99)
Potassium: 3.8 mEq/L (ref 3.5–5.1)
Sodium: 136 mEq/L (ref 135–145)
Total Bilirubin: 0.6 mg/dL (ref 0.2–1.2)
Total Protein: 7 g/dL (ref 6.0–8.3)

## 2021-05-13 ENCOUNTER — Other Ambulatory Visit: Payer: Self-pay

## 2021-05-13 NOTE — Addendum Note (Signed)
Addended by: Sherlene Shams on: 05/13/2021 12:27 PM   Modules accepted: Orders

## 2021-05-18 ENCOUNTER — Ambulatory Visit: Payer: Self-pay | Admitting: Pharmacist

## 2021-05-18 ENCOUNTER — Other Ambulatory Visit: Payer: Self-pay

## 2021-05-18 ENCOUNTER — Telehealth: Payer: Self-pay | Admitting: Pharmacist

## 2021-05-18 DIAGNOSIS — E1165 Type 2 diabetes mellitus with hyperglycemia: Secondary | ICD-10-CM

## 2021-05-18 DIAGNOSIS — E785 Hyperlipidemia, unspecified: Secondary | ICD-10-CM

## 2021-05-18 DIAGNOSIS — I10 Essential (primary) hypertension: Secondary | ICD-10-CM

## 2021-05-18 MED ORDER — METFORMIN HCL ER 500 MG PO TB24
500.0000 mg | ORAL_TABLET | Freq: Two times a day (BID) | ORAL | 1 refills | Status: DC
  Filled 2021-05-18: qty 180, 90d supply, fill #0

## 2021-05-18 MED ORDER — LOSARTAN POTASSIUM-HCTZ 100-25 MG PO TABS
1.0000 | ORAL_TABLET | Freq: Every day | ORAL | 1 refills | Status: DC
  Filled 2021-05-18: qty 90, 90d supply, fill #0

## 2021-05-18 NOTE — Telephone Encounter (Signed)
°  Chronic Care Management   Note  05/18/2021 Name: LAURANN MCMORRIS MRN: [id number] DOB: [birth year]   Attempted to contact patient for scheduled appointment for medication management support. Left HIPAA compliant message for patient to return my call at their convenience.    Plan: - If I do not hear back from the patient by end of business today, will collaborate with Care Guide to outreach to schedule follow up with me   Catie Feliz Beam, PharmD, [place], CPP Clinical Pharmacist [employer] [facility] [employer] [phone]

## 2021-05-18 NOTE — Chronic Care Management (AMB) (Signed)
Chronic Care Management CCM Pharmacy Note  05/18/2021 Name:  Sharon Melton MRN:  [id number] DOB:  1960/12/05  Summary: - Tolerating regimen well, except metformin IR.   Recommendations/Changes made from today's visit: - Stop metformin IR, start metformin ER 500 mg BID. [facility] - Follow up with PCP later this week as scheduled  Subjective: Sharon Melton is an 60 y.o. year old female who is a primary patient of Tullo, Aris Everts, MD.  The CCM team was consulted for assistance with disease management and care coordination needs.    Engaged with patient by telephone for follow up visit for pharmacy case management and/or care coordination services.   Objective:  Medications Reviewed Today     Reviewed by De Hollingshead, RPH-CPP (Pharmacist) on 05/18/21 at [place] List Status: <None>   Medication Order Taking? Sig Documenting Provider Last Dose Status Informant  amLODipine (NORVASC) 2.5 MG tablet [id number] Yes Take 1 tablet (2.5 mg total) by mouth once daily. Crecencio Mc, MD Taking Active   atorvastatin (LIPITOR) 20 MG tablet [id number] Yes Take 1 tablet (20 mg total) by mouth once daily. Crecencio Mc, MD Taking Active   blood glucose meter kit and supplies KIT [id number] Yes Use as directed to check blood sugar up to four times daily. Crecencio Mc, MD Taking Active   carvedilol (COREG) 3.125 MG tablet [id number] Yes Take 1 tablet (3.125 mg total) by mouth 2 (two) times daily with a meal. Crecencio Mc, MD Taking Active   clopidogrel (PLAVIX) 75 MG tablet [id number] Yes Take 1 tablet (75 mg total) by mouth once daily. Crecencio Mc, MD Taking Active   glucose blood (RIGHTEST [phone] BLOOD GLUCOSE) test strip [id number] Yes Use as directed to check blood sugar up to four times daily. Crecencio Mc, MD Taking Active   insulin degludec (TRESIBA FLEXTOUCH) 100 UNIT/ML FlexTouch Pen [id number] Yes Inject 20 Units into the skin once daily.  Crecencio Mc, MD Taking Active            Med Note ([place] May 18, 2021  4:04 PM) 22 units  Insulin Pen Needle 32G X 4 MM MISC [id number]  USE AS DIRECTED FOR INSULIN ONCE DAILY. Crecencio Mc, MD  Active   losartan-hydrochlorothiazide [facility]) 100-25 MG tablet [id number] Yes Take 1 tablet by mouth daily. Blake Divine, MD Taking Active   metFORMIN (GLUCOPHAGE) 500 MG tablet [id number] Yes Take 1 tablet (500 mg total) by mouth 2 (two) times daily with a meal. Crecencio Mc, MD Taking Active   Multiple Vitamin (MULTIVITAMIN) tablet [id number] Yes Take 1 tablet by mouth daily. [provider] Taking Active   PARoxetine (PAXIL) 30 MG tablet [id number] Yes Take 1 tablet (30 mg total) by mouth once daily. Crecencio Mc, MD Taking Active   Rightest GL300 Lancets MISC [id number] Yes Use as directed to check blood sugar up to four times daily. Crecencio Mc, MD Taking Active   traZODone (DESYREL) 50 MG tablet [id number] Yes Take (1/2) to 1 tablet (25-50 mg total) by mouth once daily at bedtime as needed for sleep. Crecencio Mc, MD Taking Active             Pertinent Labs:   Lab Results  Component Value Date   HGBA1C 11.4 (A) 04/23/2021   Lab Results  Component Value Date   CHOL 300 (H) 04/23/2021   HDL 59 04/23/2021  LDLCALC 187 (H) 04/23/2021   LDLDIRECT 164.0 07/22/2016   TRIG 320 (H) 04/23/2021   CHOLHDL 5.1 (H) 04/23/2021   Lab Results  Component Value Date   CREATININE 0.86 05/11/2021   BUN 14 05/11/2021   NA 136 05/11/2021   K 3.8 05/11/2021   CL 95 (L) 05/11/2021   CO2 30 05/11/2021    SDOH:  (Social Determinants of Health) assessments and interventions performed:  SDOH Interventions    Flowsheet Row Most Recent Value  SDOH Interventions   Financial Strain Interventions Other (Comment)  [[facility]]       CCM Care Plan  Review of patient past medical history, allergies, medications, health status, including  review of consultants reports, laboratory and other test data, was performed as part of comprehensive evaluation and provision of chronic care management services.   Care Plan : Medication Management  Updates made by De Hollingshead, RPH-CPP since 05/18/2021 12:00 AM     Problem: T2DM, HTN      Long-Range Goal: Disease Progression Prevention   Start Date: 04/23/2021  Recent Progress: On track  Priority: High  Note:   Current Barriers:  Unable to independently afford treatment regimen Pharmacist Clinical Goal(s):  patient will verbalize ability to afford treatment regimen through collaboration with PharmD and provider.    Interventions: 1:1 collaboration with Crecencio Mc, MD regarding development and update of comprehensive plan of care as evidenced by provider attestation and co-signature Inter-disciplinary care team collaboration (see longitudinal plan of care) Comprehensive medication review performed; medication list updated in electronic medical record  Diabetes:  Uncontrolled; current treatment: metformin 500 mg BID, Tresiba 22 units daily Does report some diarrhea, GI upset with metformin Current glucose readings: random readings 105-120s Discussed ER metformin. Patient amenable to switch. Stop IR metformin, start metformin ER 500 mg BID. Continue Tresiba 22 units daily for now.  Reviewed goal A1c, goal fasting, goal 2 hour post prandial glucose  Hypertension:   Uncontrolled on last check; current treatment: losartan/HCTZ 50/12.5, carvedilol 3.125 mg BID, amlodipine 2.5 mg daily,  Home readings: none, she does not have a BP cuff Recommended to continue current regimen at this time. Follow up with PCP later this week as scheduled  Hyperlipidemia:   Uncontrolled per last check; current treatment: atorvastatin 20 mg daily Antiplatelet regimen: clopidogrel 75 mg daily, aspirin 81 mg daily Recommended to continue current regimen at this time; follow next lab work now  that patient is back on therapy  Depression/Anxiety:   Improved; current treatment: paroxetine 30 mg daily, trazodone 50 mg daily QPM Recommended to continue current regimen.  Patient Goals/Self-Care Activities patient will:  - collaborate with provider on medication access solutions      Plan: Telephone follow up appointment with care management team member scheduled for:  6 weeks  Catie Darnelle Maffucci, PharmD, [place], CPP Clinical Pharmacist [employer] at [employer] [phone]

## 2021-05-18 NOTE — Telephone Encounter (Signed)
Patient returned call. See CCM documentation 

## 2021-05-18 NOTE — Patient Instructions (Signed)
Visit Information  Following are the goals we discussed today:  Patient Goals/Self-Care Activities patient will:  - collaborate with provider on medication access solutions        Plan: Telephone follow up appointment with care management team member scheduled for:  6 weeks   Catie Feliz Beam, PharmD, [place], CPP Clinical Pharmacist [facility] at [facility] [phone]       Please call the care guide team at [phone] if you need to cancel or reschedule your appointment.   Patient verbalizes understanding of instructions provided today and agrees to view in MyChart.

## 2021-05-19 ENCOUNTER — Other Ambulatory Visit: Payer: Self-pay

## 2021-05-19 ENCOUNTER — Other Ambulatory Visit (HOSPITAL_COMMUNITY): Payer: Self-pay

## 2021-05-21 ENCOUNTER — Encounter: Payer: Self-pay | Admitting: Internal Medicine

## 2021-05-21 ENCOUNTER — Telehealth (INDEPENDENT_AMBULATORY_CARE_PROVIDER_SITE_OTHER): Payer: Self-pay | Admitting: Internal Medicine

## 2021-05-21 VITALS — Ht 65.0 in | Wt 146.0 lb

## 2021-05-21 DIAGNOSIS — E1169 Type 2 diabetes mellitus with other specified complication: Secondary | ICD-10-CM

## 2021-05-21 DIAGNOSIS — I10 Essential (primary) hypertension: Secondary | ICD-10-CM

## 2021-05-21 DIAGNOSIS — R748 Abnormal levels of other serum enzymes: Secondary | ICD-10-CM

## 2021-05-21 DIAGNOSIS — E785 Hyperlipidemia, unspecified: Secondary | ICD-10-CM

## 2021-05-21 NOTE — Progress Notes (Signed)
Virtual Visit  converted to  Telephone  Note  This visit type was conducted due to national recommendations for restrictions regarding the COVID-19 pandemic (e.g. social distancing).  This format is felt to be most appropriate for this patient at this time.  All issues noted in this document were discussed and addressed.  No physical exam was performed (except for noted visual exam findings with Video Visits).   I connected withNAME@ on 05/21/21 at  3:00 PM EST by a video enabled telemedicine application  and verified that I am speaking with the correct person using two identifiers. Location patient: home Location provider: work or home office Persons participating in the virtual visit: patient, provider  I discussed the limitations, risks, security and privacy concerns of performing an evaluation and management service by telephone and the availability of in person appointments. I also discussed with the patient that there may be a patient responsible charge related to this service. The patient expressed understanding and agreed to proceed.  Interactive audio and video telecommunications were attempted between this provider and patient, however failed, due to patient having technical difficulties .  We continued and completed visit with audio only.   Reason for visit: follow up on type 2 DM,  hyperlipidemia and hypertension   HPI:  1) type 2 DM:  seen one month ago after being lost to follow up for a year.  Diabetes was uncontrolled due to being off medications due to financial hardship.  Since resuming her previus regimen, her glycemic control has improved,  she reports fasting readings of 126 and 108  most recently, with   no lows.  She is not tolerating metformin due to nausea and diarrhea,  and has  Increased  dose of tresiba to 22 units daily   2) Insomnia :  using trazodone  50 mg and getting about 4 hrs of sleep   3) HTN: not checking , does not own a machine  yet.    4) Alcohol abuse:   has cut back on her alcohol after her labs indicated liver inflammation.    ROS: See pertinent positives and negatives per HPI.  Past Medical History:  Diagnosis Date   Diabetes mellitus    Hyperlipidemia    Hypertension    Myocardial infarction [facility]) 02/2007   Inferior STEMI with bare metal stent to the PDA placed at [facility]    Past Surgical History:  Procedure Laterality Date   SPINE SURGERY     lumbar    Family History  Problem Relation Age of Onset   Mental illness Mother    Heart disease Mother    COPD Maternal Uncle    Heart disease Maternal Grandmother        first MI at 28   Hearing loss Maternal Grandfather    Heart disease Paternal Grandfather        66    SOCIAL HX: reports that she quit smoking about 14 years ago. Her smoking use included cigarettes. She has never used smokeless tobacco. She reports current drug use. She reports that she does not drink alcohol.    Current Outpatient Medications:    amLODipine (NORVASC) 2.5 MG tablet, Take 1 tablet (2.5 mg total) by mouth once daily., Disp: 90 tablet, Rfl: 1   atorvastatin (LIPITOR) 20 MG tablet, Take 1 tablet (20 mg total) by mouth once daily., Disp: 90 tablet, Rfl: 1   blood glucose meter kit and supplies KIT, Use as directed to check blood sugar up to four  times daily., Disp: 1 each, Rfl: 0   carvedilol (COREG) 3.125 MG tablet, Take 1 tablet (3.125 mg total) by mouth 2 (two) times daily with a meal., Disp: 180 tablet, Rfl: 1   clopidogrel (PLAVIX) 75 MG tablet, Take 1 tablet (75 mg total) by mouth once daily., Disp: 90 tablet, Rfl: 1   glucose blood (RIGHTEST GS550 BLOOD GLUCOSE) test strip, Use as directed to check blood sugar up to four times daily., Disp: 100 strip, Rfl: 0   insulin degludec (TRESIBA FLEXTOUCH) 100 UNIT/ML FlexTouch Pen, Inject 20 Units into the skin once daily., Disp: 15 mL, Rfl: 2   Insulin Pen Needle 32G X 4 MM MISC, USE AS DIRECTED FOR INSULIN ONCE DAILY., Disp: 100 each, Rfl: 3    losartan-hydrochlorothiazide (HYZAAR) 100-25 MG tablet, Take 1 tablet by mouth daily., Disp: 90 tablet, Rfl: 1   metFORMIN (GLUCOPHAGE-XR) 500 MG 24 hr tablet, Take 1 tablet (500 mg total) by mouth 2 (two) times daily., Disp: 180 tablet, Rfl: 1   Multiple Vitamin (MULTIVITAMIN) tablet, Take 1 tablet by mouth daily., Disp: , Rfl:    PARoxetine (PAXIL) 30 MG tablet, Take 1 tablet (30 mg total) by mouth once daily., Disp: 90 tablet, Rfl: 1   Rightest GL300 Lancets MISC, Use as directed to check blood sugar up to four times daily., Disp: 100 each, Rfl: 0   traZODone (DESYREL) 50 MG tablet, Take (1/2) to 1 tablet (25-50 mg total) by mouth once daily at bedtime as needed for sleep., Disp: 90 tablet, Rfl: 1  EXAM:  VITALS per patient if applicable:  GENERAL: alert, oriented, appears well and in no acute distress  HEENT: atraumatic, conjunttiva clear, no obvious abnormalities on inspection of external nose and ears  NECK: normal movements of the head and neck  LUNGS: on inspection no signs of respiratory distress, breathing rate appears normal, no obvious gross SOB, gasping or wheezing  CV: no obvious cyanosis  MS: moves all visible extremities without noticeable abnormality  PSYCH/NEURO: pleasant and cooperative, no obvious depression or anxiety, speech and thought processing grossly intact  ASSESSMENT AND PLAN:  Discussed the following assessment and plan:  Hyperlipidemia with target LDL less than 70  Hyperlipidemia associated with type 2 diabetes mellitus (HCC) - Plan: Lipid panel, Comprehensive metabolic panel, Hemoglobin A1c  Elevated liver enzymes  Essential hypertension  Elevated liver enzymes Recurrent  With prior screening for viral etiologies negative and  pattern of elevation suggesting alcohol abuse.  Improved wit alcohol abstinence.  Lab Results  Component Value Date   ALT 26 05/11/2021   AST 77 (H) 05/11/2021   ALKPHOS 105 05/11/2021   BILITOT 0.6 05/11/2021      Essential hypertension Unclear if BP is now controlled as she does not own a BP monitor. she reports compliance with medication regimen    She has been asked to check her  BP  at  [facility] and  submit readings for evaluation. Renal function, electrolytes and screen for proteinuria are all normal  Lab Results  Component Value Date   CREATININE 0.86 05/11/2021   Lab Results  Component Value Date   NA 136 05/11/2021   K 3.8 05/11/2021   CL 95 (L) 05/11/2021   CO2 30 05/11/2021   Lab Results  Component Value Date   CREATININE 0.86 05/11/2021      Hyperlipidemia with target LDL less than 70 Now on resumed therapy with atorvastatin . Repeat Lipids prior to next visit  Lab Results  Component  Value Date   HGBA1C 11.4 (A) 04/23/2021   Lab Results  Component Value Date   MICROALBUR 5.5 04/23/2021   MICROALBUR <0.7 12/20/2017     Lab Results  Component Value Date   CHOL 300 (H) 04/23/2021   HDL 59 04/23/2021   LDLCALC 187 (H) 04/23/2021   LDLDIRECT 164.0 07/22/2016   TRIG 320 (H) 04/23/2021   CHOLHDL 5.1 (H) 04/23/2021     Hyperlipidemia associated with type 2 diabetes mellitus (HCC) Improved glycemic control with resumption of medication.  Continue Tresiba 22 units daily and not tolerating immediate release metformin,  But willing to resume therapy with glucophage XR .  Return at 3 month intervals until a1c is < 7.0    I discussed the assessment and treatment plan with the patient. The patient was provided an opportunity to ask questions and all were answered. The patient agreed with the plan and demonstrated an understanding of the instructions.   The patient was advised to call back or seek an in-person evaluation if the symptoms worsen or if the condition fails to improve as anticipated.   I spent 20 minutes dedicated to the care of this patient on the date of this encounter to include pre-visit review of her medical history,  non Face-to-face time with  the patient , and post visit ordering of testing and therapeutics.    Crecencio Mc, MD

## 2021-05-21 NOTE — Patient Instructions (Addendum)
Please let me know if you do not tolerate the extended release metformin    Please get your BP (and your pulse /heart rate ) checked next week and send me the reading .  You should be at 130/80 or less; if not,  we will adjust your amlodipine or carvedilol dose.   Return for liver enzymes in January.  No fasting required   Please Get your free  mammogram done !    Return in 3 months (early March)   May God   bless you with His grace,  a peace that passes all understanding, and the fellowship of Christ  in the [place] Year!  Regards,   Duncan Dull, MD

## 2021-05-22 NOTE — Assessment & Plan Note (Signed)
Improved glycemic control with resumption of medication.  Continue Tresiba 22 units daily and not tolerating immediate release metformin,  But willing to resume therapy with glucophage XR .  Return at 3 month intervals until a1c is < 7.0

## 2021-05-22 NOTE — Assessment & Plan Note (Addendum)
Unclear if BP is now controlled as she does not own a BP monitor. she reports compliance with medication regimen    She has been asked to check her  BP  at  [facility] and  submit readings for evaluation. Renal function, electrolytes and screen for proteinuria are all normal  Lab Results  Component Value Date   CREATININE 0.86 05/11/2021   Lab Results  Component Value Date   NA 136 05/11/2021   K 3.8 05/11/2021   CL 95 (L) 05/11/2021   CO2 30 05/11/2021   Lab Results  Component Value Date   CREATININE 0.86 05/11/2021

## 2021-05-22 NOTE — Assessment & Plan Note (Signed)
Recurrent  With prior screening for viral etiologies negative and  pattern of elevation suggesting alcohol abuse.  Improved wit alcohol abstinence.  Lab Results  Component Value Date   ALT 26 05/11/2021   AST 77 (H) 05/11/2021   ALKPHOS 105 05/11/2021   BILITOT 0.6 05/11/2021

## 2021-05-22 NOTE — Assessment & Plan Note (Signed)
Now on resumed therapy with atorvastatin . Repeat Lipids prior to next visit  Lab Results  Component Value Date   HGBA1C 11.4 (A) 04/23/2021   Lab Results  Component Value Date   MICROALBUR 5.5 04/23/2021   MICROALBUR <0.7 12/20/2017     Lab Results  Component Value Date   CHOL 300 (H) 04/23/2021   HDL 59 04/23/2021   LDLCALC 187 (H) 04/23/2021   LDLDIRECT 164.0 07/22/2016   TRIG 320 (H) 04/23/2021   CHOLHDL 5.1 (H) 04/23/2021

## 2021-06-07 ENCOUNTER — Telehealth: Payer: Self-pay

## 2021-06-07 NOTE — Chronic Care Management (AMB) (Signed)
°  Care Management   Note  06/07/2021 Name: Sharon Melton MRN: [id number] DOB: June 27, 1960  Sharon Melton is a 61 y.o. year old female who is a primary care patient of Darrick Huntsman, Mar Daring, MD and is actively engaged with the care management team. I reached out to Greggory Brandy by phone today to assist with re-scheduling an initial visit with the Licensed Clinical Social Worker  Follow up plan: Unsuccessful telephone outreach attempt made. A HIPAA compliant phone message was left for the patient providing contact information and requesting a return call.  The care management team will reach out to the patient again over the next 5 days.  If patient returns call to provider office, please advise to call Embedded Care Management Care Guide Penne Lash  at [phone]  Penne Lash, RMA Care Guide, Embedded Care Coordination [facility]  [place], [place] [address] Direct Dial: [phone] [email or web address] Website: [email or web address]

## 2021-06-09 ENCOUNTER — Telehealth: Payer: Self-pay

## 2021-06-10 NOTE — Chronic Care Management (AMB) (Signed)
°  Care Management   Note  06/10/2021 Name: [name] MRN: [id number] DOB: 07/28/1960  [name] is a 61 y.o. year old female who is a primary care patient of Darrick Huntsman, Mar Daring, MD and is actively engaged with the care management team. I reached out to Greggory Brandy by phone today to assist with re-scheduling an initial visit with the Licensed Clinical Social Worker  Follow up plan: Unsuccessful telephone outreach attempt made. A HIPAA compliant phone message was left for the patient providing contact information and requesting a return call.  The care management team will reach out to the patient again over the next 7 days.  If patient returns call to provider office, please advise to call Embedded Care Management Care Guide Penne Lash  at [phone]  Penne Lash, RMA Care Guide, Embedded Care Coordination [facility]  [place], [place] [address] Direct Dial: [phone] [email or web address] Website: [email or web address]

## 2021-06-17 NOTE — Chronic Care Management (AMB) (Signed)
°  Care Management   Note  06/17/2021 Name: JALESA THIEN MRN: [id number] DOB: [birth year]  BLAZE SANDIN is a 61 y.o. year old female who is a primary care patient of Darrick Huntsman, Mar Daring, MD and is actively engaged with the care management team. I reached out to Greggory Brandy by phone today to assist with re-scheduling an initial visit with the Licensed Clinical Social Worker  Follow up plan: Unable to make contact on outreach attempts x 3. PCP Sherlene Shams, MD notified via routed documentation in medical record.   Penne Lash, RMA Care Guide, Embedded Care Coordination [facility]  [place], [place] [address] Direct Dial: [phone] [email or web address]

## 2021-06-21 ENCOUNTER — Other Ambulatory Visit: Payer: Self-pay

## 2021-06-22 ENCOUNTER — Other Ambulatory Visit: Payer: Self-pay

## 2021-06-23 ENCOUNTER — Other Ambulatory Visit: Payer: Self-pay

## 2021-06-28 ENCOUNTER — Other Ambulatory Visit: Payer: Self-pay

## 2021-07-09 ENCOUNTER — Telehealth: Payer: Self-pay | Admitting: Pharmacist

## 2021-07-09 ENCOUNTER — Other Ambulatory Visit: Payer: Self-pay

## 2021-07-09 ENCOUNTER — Telehealth: Payer: Self-pay

## 2021-07-09 NOTE — Telephone Encounter (Signed)
°  Chronic Care Management   Note  07/09/2021 Name: LUDWIKA RODD MRN: [id number] DOB: Mar 25, 1961   Attempted to contact patient for scheduled appointment for medication management support. Left HIPAA compliant message for patient to return my call at their convenience.    Plan: - If I do not hear back from the patient by end of business today, will collaborate with Care Guide to outreach to schedule follow up with me   Catie Feliz Beam, PharmD, [place], CPP Clinical Pharmacist [employer] at [employer] [phone]

## 2021-07-12 ENCOUNTER — Telehealth: Payer: Self-pay

## 2021-07-12 NOTE — Chronic Care Management (AMB) (Signed)
°  Care Management   Note  07/12/2021 Name: Sharon Melton MRN: [id number] DOB: 03-09-1961  Sharon Melton is a 61 y.o. year old female who is a primary care patient of Crecencio Mc, MD and is actively engaged with the care management team. I reached out to Ezequiel Ganser by phone today to assist with re-scheduling a follow up visit with the Pharmacist  Follow up plan: Unsuccessful telephone outreach attempt made. A HIPAA compliant phone message was left for the patient providing contact information and requesting a return call.  The care management team will reach out to the patient again over the next 3 days.  If patient returns call to provider office, please advise to call [facility]  at [place], [place], [place], [place] [address] Direct Dial: [phone] [email or web address] Website: [email or web address]

## 2021-07-20 NOTE — Chronic Care Management (AMB) (Signed)
°  Care Management   Note  07/20/2021 Name: [name] MRN: [id number] DOB: September 18, 1960  Sharon Melton is a 61 y.o. year old female who is a primary care patient of Derrel Nip, Aris Everts, MD and is actively engaged with the care management team. I reached out to Ezequiel Ganser by phone today to assist with re-scheduling a follow up visit with the Pharmacist  Follow up plan: Telephone appointment with care management team member scheduled for:07/22/2021  Noreene Larsson, [place], [place], [place] [address] Direct Dial: [phone] [email or web address]: [email or web address]

## 2021-07-22 ENCOUNTER — Telehealth: Payer: Self-pay

## 2021-07-22 ENCOUNTER — Other Ambulatory Visit: Payer: Self-pay

## 2021-07-22 ENCOUNTER — Ambulatory Visit: Payer: Self-pay | Admitting: Pharmacist

## 2021-07-22 DIAGNOSIS — E1169 Type 2 diabetes mellitus with other specified complication: Secondary | ICD-10-CM

## 2021-07-22 DIAGNOSIS — E1165 Type 2 diabetes mellitus with hyperglycemia: Secondary | ICD-10-CM

## 2021-07-22 DIAGNOSIS — I10 Essential (primary) hypertension: Secondary | ICD-10-CM

## 2021-07-22 DIAGNOSIS — F411 Generalized anxiety disorder: Secondary | ICD-10-CM

## 2021-07-22 NOTE — Chronic Care Management (AMB) (Signed)
?  Chronic Care Management  ? ?Note ? ?07/22/2021 ?Name: Sharon Melton MRN: [id number] DOB: 12-Apr-1961 ? ?Sharon Melton is a 61 y.o. year old female who is a primary care patient of Darrick Huntsman, Mar Daring, MD. Sharon Melton is currently enrolled in care management services. An additional referral for LCSW  was placed.  ? ?Follow up plan: ?Telephone appointment with care management team member scheduled for:07/29/2021 ? ?Penne Lash, RMA ?Care Guide, Embedded Care Coordination ?[facility]  ?[place], [place] [address] ?Direct Dial: [phone] [email or web address] ?Website: [email or web address]  ? ?

## 2021-07-23 ENCOUNTER — Telehealth: Payer: Self-pay | Admitting: Pharmacist

## 2021-07-23 DIAGNOSIS — E1165 Type 2 diabetes mellitus with hyperglycemia: Secondary | ICD-10-CM

## 2021-07-23 MED ORDER — ATORVASTATIN CALCIUM 20 MG PO TABS: 20.0000 mg | ORAL_TABLET | Freq: Every day | ORAL | 1 refills | Status: DC

## 2021-07-23 MED ORDER — TRAZODONE HCL 50 MG PO TABS: 25.0000 mg | ORAL_TABLET | Freq: Every evening | ORAL | 1 refills | Status: DC | PRN

## 2021-07-23 MED ORDER — AMLODIPINE BESYLATE 2.5 MG PO TABS: 2.5000 mg | ORAL_TABLET | Freq: Every day | ORAL | 1 refills | Status: DC

## 2021-07-23 MED ORDER — LOSARTAN POTASSIUM-HCTZ 100-25 MG PO TABS: 1.0000 | ORAL_TABLET | Freq: Every day | ORAL | 1 refills | Status: DC

## 2021-07-23 MED ORDER — INSULIN PEN NEEDLE 32G X 4 MM MISC: 3 refills | Status: DC

## 2021-07-23 MED ORDER — PAROXETINE HCL 30 MG PO TABS: 30.0000 mg | ORAL_TABLET | Freq: Every day | ORAL | 1 refills | Status: DC

## 2021-07-23 MED ORDER — METFORMIN HCL ER 500 MG PO TB24: 500.0000 mg | ORAL_TABLET | Freq: Two times a day (BID) | ORAL | 1 refills | Status: DC

## 2021-07-23 MED ORDER — TRESIBA FLEXTOUCH 100 UNIT/ML ~~LOC~~ SOPN: 20.0000 [IU] | PEN_INJECTOR | Freq: Every day | SUBCUTANEOUS | 2 refills | Status: DC

## 2021-07-23 MED ORDER — CLOPIDOGREL BISULFATE 75 MG PO TABS: 75.0000 mg | ORAL_TABLET | Freq: Every day | ORAL | 1 refills | Status: DC

## 2021-07-23 MED ORDER — CARVEDILOL 3.125 MG PO TABS: 3.1250 mg | ORAL_TABLET | Freq: Two times a day (BID) | ORAL | 1 refills | Status: DC

## 2021-07-23 NOTE — Addendum Note (Signed)
Addended by: Sherlene Shams on: 07/23/2021 04:56 PM ? ? Modules accepted: Orders ? ?

## 2021-07-23 NOTE — Telephone Encounter (Signed)
Called patient. Provided Sharon Melton's phone for her to call to schedule with our social worker.  ? ?Reviewed income. Patient is over income for [facility] for her generic medications, and over income for [employer] assistance for [place].  ? ?Most cost effective option for medications is to send to [facility]. Will send refills under Dr. Darrick Huntsman today.  ? ?For Sharon Melton, [employer] My99 program may reduce the cost of insulin. Will assist patient in downloading savings card.  ? ?Patient is to call the office to schedule follow up with Dr. Darrick Huntsman as soon as possible.  ?

## 2021-07-23 NOTE — Chronic Care Management (AMB) (Signed)
? ?Chronic Care Management ?CCM Pharmacy Note ? ?07/23/2021 ?Name:  Sharon Melton MRN:  [id number] DOB:  01/21/1961 ? ?Summary: ?- Reports she is only taking paroxetine and trazodone at this time, does not have other medications. States she is over income for [facility], though [facility] does not have on file that she was denied assistance ?- Reviewed [facility] pricing vs [facility] pricing. If patient is over income for [facility] assistance, would recommend sending scripts back to [place].  ?- Patient requests support with mental health. Referred to LCSW.  ? ?Recommendations/Changes made from today's visit: ?- Attempted to call patient back to discuss all of the above. Unable to get in touch. Will continue to try.  ? ?Subjective: ?Sharon Melton is an 61 y.o. year old female who is a primary patient of Tullo, Aris Everts, MD.  The CCM team was consulted for assistance with disease management and care coordination needs.   ? ?Engaged with patient by telephone for follow up visit for pharmacy case management and/or care coordination services.  ? ?Objective: ? ?Medications Reviewed Today   ? ? Reviewed by De Hollingshead, RPH-CPP (Pharmacist) on 07/22/21 at 1431  Med List Status: <None>  ? ?Medication Order Taking? Sig Documenting Provider Last Dose Status Informant  ?amLODipine (NORVASC) 2.5 MG tablet [id number] No Take 1 tablet (2.5 mg total) by mouth once daily.  ?Patient not taking: Reported on 07/22/2021  ? Crecencio Mc, MD Not Taking Active   ?atorvastatin (LIPITOR) 20 MG tablet [id number] No Take 1 tablet (20 mg total) by mouth once daily.  ?Patient not taking: Reported on 07/22/2021  ? Crecencio Mc, MD Not Taking Active   ?blood glucose meter kit and supplies KIT [id number]  Use as directed to check blood sugar up to four times daily. Crecencio Mc, MD  Active   ?carvedilol (COREG) 3.125 MG tablet [id number] No Take 1 tablet (3.125 mg total) by mouth 2 (two) times daily with a meal.  ?Patient not taking:  Reported on 07/22/2021  ? Crecencio Mc, MD Not Taking Active   ?clopidogrel (PLAVIX) 75 MG tablet [id number] No Take 1 tablet (75 mg total) by mouth once daily.  ?Patient not taking: Reported on 07/22/2021  ? Crecencio Mc, MD Not Taking Active   ?glucose blood (RIGHTEST GS550 BLOOD GLUCOSE) test strip [id number]  Use as directed to check blood sugar up to four times daily. Crecencio Mc, MD  Active   ?insulin degludec (TRESIBA FLEXTOUCH) 100 UNIT/ML FlexTouch Pen [id number] No Inject 20 Units into the skin once daily.  ?Patient not taking: Reported on 07/22/2021  ? Crecencio Mc, MD Not Taking Active   ?         ?Med Note De Hollingshead   Tue May 18, 2021  4:04 PM) 22 units  ?Insulin Pen Needle 32G X 4 MM MISC [id number]  USE AS DIRECTED FOR INSULIN ONCE DAILY. Crecencio Mc, MD  Active   ?losartan-hydrochlorothiazide (HYZAAR) 100-25 MG tablet [id number] No Take 1 tablet by mouth daily.  ?Patient not taking: Reported on 07/22/2021  ? Crecencio Mc, MD Not Taking Active   ?metFORMIN (GLUCOPHAGE-XR) 500 MG 24 hr tablet [id number] No Take 1 tablet (500 mg total) by mouth 2 (two) times daily.  ?Patient not taking: Reported on 07/22/2021  ? Crecencio Mc, MD Not Taking Active   ?Multiple Vitamin (MULTIVITAMIN) tablet [id number]  Take 1 tablet by mouth daily. [provider]  Active   ?  PARoxetine (PAXIL) 30 MG tablet [id number] Yes Take 1 tablet (30 mg total) by mouth once daily. Crecencio Mc, MD Taking Active   ?Rightest GL300 Lancets MISC [id number]  Use as directed to check blood sugar up to four times daily. Crecencio Mc, MD  Active   ?traZODone (DESYREL) 50 MG tablet [id number] Yes Take (1/2) to 1 tablet (25-50 mg total) by mouth once daily at bedtime as needed for sleep. Crecencio Mc, MD Taking Active   ? ?  ?  ? ?  ? ? ?Pertinent Labs:   ?Lab Results  ?Component Value Date  ? HGBA1C 11.4 (A) 04/23/2021  ? ?Lab Results  ?Component Value Date  ? CHOL 300 (H) 04/23/2021  ? HDL 59 04/23/2021  ?  LDLCALC 187 (H) 04/23/2021  ? LDLDIRECT 164.0 07/22/2016  ? TRIG 320 (H) 04/23/2021  ? CHOLHDL 5.1 (H) 04/23/2021  ? ?Lab Results  ?Component Value Date  ? CREATININE 0.86 05/11/2021  ? BUN 14 05/11/2021  ? NA 136 05/11/2021  ? K 3.8 05/11/2021  ? CL 95 (L) 05/11/2021  ? CO2 30 05/11/2021  ? ? ?SDOH:  (Social Determinants of Health) assessments and interventions performed:  ?SDOH Interventions   ? ?Flowsheet Row Most Recent Value  ?SDOH Interventions   ?Financial Strain Interventions Other (Comment)  [referral to medication management clinic]  ? ?  ? ? ?[place] ? ?Review of patient past medical history, allergies, medications, health status, including review of consultants reports, laboratory and other test data, was performed as part of comprehensive evaluation and provision of chronic care management services.  ? ?Care Plan : Medication Management  ?Updates made by De Hollingshead, RPH-CPP since 07/23/2021 12:00 AM  ?  ? ?Problem: T2DM, HTN   ?  ? ?Long-Range Goal: Disease Progression Prevention   ?Start Date: 04/23/2021  ?Recent Progress: On track  ?Priority: High  ?Note:   ?Current Barriers:  ?Unable to independently afford treatment regimen ?Pharmacist Clinical Goal(s):  ?patient will verbalize ability to afford treatment regimen through collaboration with PharmD and provider.  ? ? ?Interventions: ?1:1 collaboration with Crecencio Mc, MD regarding development and update of comprehensive plan of care as evidenced by provider attestation and co-signature ?Inter-disciplinary care team collaboration (see longitudinal plan of care) ?Comprehensive medication review performed; medication list updated in electronic medical record ? ?Medication Access: ?Reports she has recently stopped drinking alcohol. Has not been focused on her health over the past few months. Reports that she was over income for [facility] (though this is not documented), and that the only medications she currently has  are paroxetine and trazodone.  ?Discussed patient with [facility] pharmacy. She does not have documentation that patient was denied for assistance.  ?[place] to determine pricing vs [employer]. [facility] prices would be cheaper. Attempted to call patient back to discuss all of this, was unable to get in touch.  ? ? ?Diabetes:  ?Uncontrolled; current treatment: metformin 500 mg BID, Tresiba 22 units daily - reports she is not taking any of these right now ?Will encourage collaboration with [facility] to apply for [employer] assistance for [place]. Attempted to call patient to discuss as above, was unable to get in touch.  ? ?Hypertension:   ?Uncontrolled on last check; current treatment: losartan/HCTZ 50/12.5, carvedilol 3.125 mg BID, amlodipine 2.5 mg daily - though reports she is not taking any of these right now ?Home readings: none, she does not  have a BP cuff ?Attempted to call patient to discuss as above, was unable to get in touch.  ? ?Hyperlipidemia:   ?Uncontrolled per last check; current treatment: atorvastatin 20 mg daily - reports she is not taking ?Antiplatelet regimen: clopidogrel 75 mg daily, aspirin 81 mg daily - reports she is not taking ?Attempted to call patient to discuss as above, was unable to get in touch.  ? ?Depression/Anxiety:   ?Improved; current treatment: paroxetine 30 mg daily, trazodone 50 mg daily QPM ?Recommended to continue current regimen. ? ?Patient Goals/Self-Care Activities ?patient will:  ?- collaborate with provider on medication access solutions ?  ?  ? ?Plan: Telephone follow up appointment with care management team member scheduled for:  as soon as I can get back in touch with patient ? ?Catie Darnelle Maffucci, PharmD, BCACP, CPP ?Clinical Pharmacist ?Therapist, music at [employer] ?[phone] ? ? ? ? ? ?

## 2021-07-23 NOTE — Telephone Encounter (Signed)
Med's sent as requested

## 2021-07-23 NOTE — Telephone Encounter (Signed)
Pt returning call

## 2021-07-23 NOTE — Patient Instructions (Signed)
[place],  ? ?Please call me back at Dr. Melina Schools office as soon as you can so that we can discuss how to get your medications.  ? ?Thanks! ? ?Catie Feliz Beam, PharmD ?

## 2021-07-23 NOTE — Telephone Encounter (Signed)
Attempted to call patient to discuss medication access. She reported to me yesterday that she was only taking paroxetine and trazodone because she was denied assistance at [facility] due to household income. I spoke with Willeen Niece at [facility] and she did not have documentation that she had spoken with patient. When I tried to call patient back to discuss this, she did not answer.  ? ?Called again this morning. Left voicemail for patient to return my call at her convenience.  ?

## 2021-07-26 ENCOUNTER — Ambulatory Visit: Payer: Self-pay | Admitting: Pharmacist

## 2021-07-26 NOTE — Chronic Care Management (AMB) (Signed)
?  Chronic Care Management  ? ?Note ? ?07/26/2021 ?Name: CALLYN SEVERTSON MRN: [id number] DOB: 08-13-1960 ? ? ? ?Closing pharmacy CCM case at this time. Follow up with LCSW as scheduled. Patient has clinic contact information for future questions or concerns.  ? ?Catie Feliz Beam, PharmD, [place], CPP ?Clinical Pharmacist ?Nature conservation officer at [employer] ?[phone] ? ?

## 2021-07-29 ENCOUNTER — Telehealth: Payer: Self-pay

## 2021-07-29 ENCOUNTER — Telehealth: Payer: Self-pay | Admitting: *Deleted

## 2021-07-29 NOTE — Telephone Encounter (Signed)
?  Care Management  ? ?Follow Up Note ? ? ?07/29/2021 ?Name: Sharon Melton MRN: [id number] DOB: 10-Sep-1960 ? ? ?Referred by: Crecencio Mc, MD ?Reason for referral : Care Coordination ? ? ?An unsuccessful telephone outreach was attempted today. The patient was referred to the case management team for assistance with care management and care coordination.  ? ?Follow Up Plan: Telephone follow up appointment with care management team member to be re-scheduled by Careguide. ? ?[employer], LCSW ?[place] ?[phone] ? ? ?

## 2021-07-30 ENCOUNTER — Telehealth: Payer: Self-pay | Admitting: Pharmacy Technician

## 2021-07-30 ENCOUNTER — Other Ambulatory Visit: Payer: Self-pay

## 2021-07-30 NOTE — Telephone Encounter (Signed)
Spoke with patient regarding obtaining medication assistance from [facility].  Patient indicated that annual household income is over [facility] eligibility limit of 300% [employer].  Annual household income is at 800% [employer].   ? ?Sharon Melton ?Care Manager ?[facility] ?

## 2021-08-06 ENCOUNTER — Other Ambulatory Visit: Payer: Self-pay

## 2021-08-20 ENCOUNTER — Telehealth: Payer: Self-pay

## 2021-08-20 NOTE — Chronic Care Management (AMB) (Signed)
?  Care Management  ? ?Note ? ?08/20/2021 ?Name: Sharon Melton MRN: [id number] DOB: Mar 08, 1961 ? ?Sharon Melton is a 61 y.o. year old female who is a primary care patient of Darrick Huntsman, Mar Daring, MD and is actively engaged with the care management team. I reached out to Greggory Brandy by phone today to assist with re-scheduling an initial visit with the Licensed Clinical Social Worker ? ?Follow up plan: ?Unsuccessful telephone outreach attempt made. A HIPAA compliant phone message was left for the patient providing contact information and requesting a return call.  ?The care management team will reach out to the patient again over the next 7 days.  ?If patient returns call to provider office, please advise to call Embedded Care Management Care Guide Penne Lash  at [phone] ? ?Penne Lash, RMA ?Care Guide, Embedded Care Coordination ?[facility]  ?[place], [place] [address] ?Direct Dial: [phone] ?[email or web address] ?Website: [email or web address]  ? ?

## 2021-09-09 NOTE — Chronic Care Management (AMB) (Signed)
?  Care Management  ? ?Note ? ?09/09/2021 ?Name: VERMELLE CAMMARATA MRN: [id number] DOB: 1961/03/11 ? ?Sharon Melton is a 61 y.o. year old female who is a primary care patient of Darrick Huntsman, Mar Daring, MD and is actively engaged with the care management team. I reached out to Greggory Brandy by phone today to assist with re-scheduling an initial visit with the Licensed Clinical Social Worker ? ?Follow up plan: ?Unsuccessful telephone outreach attempt made. A HIPAA compliant phone message was left for the patient providing contact information and requesting a return call.  ?The care management team will reach out to the patient again over the next 7 days.  ?If patient returns call to provider office, please advise to call Embedded Care Management Care Guide Penne Lash  at [phone] ? ?Penne Lash, RMA ?Care Guide, Embedded Care Coordination ?[facility]  ?[place], [place] [address] ?Direct Dial: [phone] ?[email or web address] ?Website: [email or web address]   ?

## 2021-09-12 ENCOUNTER — Inpatient Hospital Stay
Admission: EM | Admit: 2021-09-12 | Discharge: 2021-09-16 | DRG: 641 | Disposition: A | Discharge status: Home / Self Care | Payer: Self-pay | Attending: Internal Medicine | Admitting: Internal Medicine

## 2021-09-12 ENCOUNTER — Other Ambulatory Visit: Payer: Self-pay

## 2021-09-12 DIAGNOSIS — E876 Hypokalemia: Principal | ICD-10-CM | POA: Diagnosis present

## 2021-09-12 DIAGNOSIS — Z8249 Family history of ischemic heart disease and other diseases of the circulatory system: Secondary | ICD-10-CM

## 2021-09-12 DIAGNOSIS — F1010 Alcohol abuse, uncomplicated: Secondary | ICD-10-CM

## 2021-09-12 DIAGNOSIS — E785 Hyperlipidemia, unspecified: Secondary | ICD-10-CM | POA: Diagnosis present

## 2021-09-12 DIAGNOSIS — I10 Essential (primary) hypertension: Secondary | ICD-10-CM | POA: Diagnosis present

## 2021-09-12 DIAGNOSIS — R739 Hyperglycemia, unspecified: Secondary | ICD-10-CM

## 2021-09-12 DIAGNOSIS — E119 Type 2 diabetes mellitus without complications: Secondary | ICD-10-CM

## 2021-09-12 DIAGNOSIS — R7989 Other specified abnormal findings of blood chemistry: Secondary | ICD-10-CM | POA: Diagnosis present

## 2021-09-12 DIAGNOSIS — F10939 Alcohol use, unspecified with withdrawal, unspecified: Secondary | ICD-10-CM | POA: Diagnosis present

## 2021-09-12 DIAGNOSIS — R9431 Abnormal electrocardiogram [ECG] [EKG]: Secondary | ICD-10-CM | POA: Diagnosis present

## 2021-09-12 DIAGNOSIS — I252 Old myocardial infarction: Secondary | ICD-10-CM

## 2021-09-12 DIAGNOSIS — E1169 Type 2 diabetes mellitus with other specified complication: Secondary | ICD-10-CM

## 2021-09-12 DIAGNOSIS — E871 Hypo-osmolality and hyponatremia: Secondary | ICD-10-CM

## 2021-09-12 DIAGNOSIS — Z87891 Personal history of nicotine dependence: Secondary | ICD-10-CM

## 2021-09-12 DIAGNOSIS — I2510 Atherosclerotic heart disease of native coronary artery without angina pectoris: Secondary | ICD-10-CM | POA: Diagnosis present

## 2021-09-12 DIAGNOSIS — Z794 Long term (current) use of insulin: Secondary | ICD-10-CM

## 2021-09-12 DIAGNOSIS — Z7984 Long term (current) use of oral hypoglycemic drugs: Secondary | ICD-10-CM

## 2021-09-12 DIAGNOSIS — E1165 Type 2 diabetes mellitus with hyperglycemia: Secondary | ICD-10-CM | POA: Diagnosis present

## 2021-09-12 DIAGNOSIS — Z20822 Contact with and (suspected) exposure to covid-19: Secondary | ICD-10-CM | POA: Diagnosis present

## 2021-09-12 DIAGNOSIS — F10139 Alcohol abuse with withdrawal, unspecified: Secondary | ICD-10-CM | POA: Diagnosis present

## 2021-09-12 LAB — COMPREHENSIVE METABOLIC PANEL
ALT: 49 U/L — ABNORMAL HIGH (ref 0–44)
AST: 130 U/L — ABNORMAL HIGH (ref 15–41)
Albumin: 3.8 g/dL (ref 3.5–5.0)
Alkaline Phosphatase: 151 U/L — ABNORMAL HIGH (ref 38–126)
Anion gap: 17 — ABNORMAL HIGH (ref 5–15)
BUN: 12 mg/dL (ref 8–23)
CO2: 23 mmol/L (ref 22–32)
Calcium: 10.3 mg/dL (ref 8.9–10.3)
Chloride: 89 mmol/L — ABNORMAL LOW (ref 98–111)
Creatinine, Ser: 0.76 mg/dL (ref 0.44–1.00)
GFR, Estimated: >60 mL/min (ref >60)
Glucose, Bld: 567 mg/dL (ref 70–99)
Potassium: 2.4 mmol/L — CL (ref 3.5–5.1)
Sodium: 129 mmol/L — ABNORMAL LOW (ref 135–145)
Total Bilirubin: 0.8 mg/dL (ref 0.3–1.2)
Total Protein: 7.3 g/dL (ref 6.5–8.1)

## 2021-09-12 LAB — URINE DRUG SCREEN, QUALITATIVE (ARMC ONLY)
Amphetamines, Ur Screen: NOT DETECTED
Barbiturates, Ur Screen: NOT DETECTED
Benzodiazepine, Ur Scrn: NOT DETECTED
Cannabinoid 50 Ng, Ur ~~LOC~~: POSITIVE — AB
Cocaine Metabolite,Ur ~~LOC~~: NOT DETECTED
MDMA (Ecstasy)Ur Screen: NOT DETECTED
Methadone Scn, Ur: NOT DETECTED
Opiate, Ur Screen: NOT DETECTED
Phencyclidine (PCP) Ur S: NOT DETECTED
Tricyclic, Ur Screen: NOT DETECTED

## 2021-09-12 LAB — CBC
HCT: 39.9 % (ref 36.0–46.0)
Hemoglobin: 13.1 g/dL (ref 12.0–15.0)
MCH: 25.5 pg — ABNORMAL LOW (ref 26.0–34.0)
MCHC: 32.8 g/dL (ref 30.0–36.0)
MCV: 77.6 fL — ABNORMAL LOW (ref 80.0–100.0)
Platelets: 225 10*3/uL (ref 150–400)
RBC: 5.14 MIL/uL — ABNORMAL HIGH (ref 3.87–5.11)
RDW: 13.4 % (ref 11.5–15.5)
WBC: 6.9 10*3/uL (ref 4.0–10.5)
nRBC: 0 % (ref 0.0–0.2)

## 2021-09-12 LAB — RESP PANEL BY RT-PCR (FLU A&B, COVID) ARPGX2
Influenza A by PCR: NEGATIVE
Influenza B by PCR: NEGATIVE
SARS Coronavirus 2 by RT PCR: NEGATIVE

## 2021-09-12 LAB — BLOOD GAS, VENOUS
Acid-Base Excess: 9 mmol/L — ABNORMAL HIGH (ref 0.0–2.0)
Bicarbonate: 32.7 mmol/L — ABNORMAL HIGH (ref 20.0–28.0)
O2 Saturation: 71.4 %
Patient temperature: 37
pCO2, Ven: 40 mmHg — ABNORMAL LOW (ref 44–60)
pH, Ven: 7.52 — ABNORMAL HIGH (ref 7.25–7.43)
pO2, Ven: 44 mmHg (ref 32–45)

## 2021-09-12 LAB — MAGNESIUM: Magnesium: 1.7 mg/dL (ref 1.7–2.4)

## 2021-09-12 LAB — ETHANOL: Alcohol, Ethyl (B): 15 mg/dL — ABNORMAL HIGH (ref <10)

## 2021-09-12 LAB — BETA-HYDROXYBUTYRIC ACID: Beta-Hydroxybutyric Acid: 1.22 mmol/L — ABNORMAL HIGH (ref 0.05–0.27)

## 2021-09-12 MED ORDER — THIAMINE HCL 100 MG/ML IJ SOLN
100.0000 mg | Freq: Every day | INTRAMUSCULAR | Status: DC
  Filled 2021-09-12: qty 2

## 2021-09-12 MED ORDER — POTASSIUM CHLORIDE 10 MEQ/100ML IV SOLN
10.0000 meq | INTRAVENOUS | Status: AC
  Administered 2021-09-12 – 2021-09-13 (×2): 10 meq via INTRAVENOUS
  Filled 2021-09-12 (×2): qty 100

## 2021-09-12 MED ORDER — SODIUM CHLORIDE 0.9 % IV SOLN
1000.0000 mL | Freq: Once | INTRAVENOUS | Status: AC
  Administered 2021-09-12: 1000 mL via INTRAVENOUS

## 2021-09-12 MED ORDER — INSULIN ASPART 100 UNIT/ML IJ SOLN
10.0000 [IU] | Freq: Once | INTRAMUSCULAR | Status: AC
Start: 2021-09-12 — End: 2021-09-12
  Administered 2021-09-12: 10 [IU] via INTRAVENOUS
  Filled 2021-09-12: qty 1

## 2021-09-12 MED ORDER — HYDROCHLOROTHIAZIDE 25 MG PO TABS: 25.0000 mg | ORAL_TABLET | Freq: Every day | ORAL | Status: DC

## 2021-09-12 MED ORDER — LORAZEPAM 2 MG/ML IJ SOLN: 0.0000 mg | Freq: Two times a day (BID) | INTRAMUSCULAR | Status: AC

## 2021-09-12 MED ORDER — CLOPIDOGREL BISULFATE 75 MG PO TABS
75.0000 mg | ORAL_TABLET | Freq: Every day | ORAL | Status: DC
  Administered 2021-09-13 – 2021-09-16 (×4): 75 mg via ORAL
  Filled 2021-09-12 (×5): qty 1

## 2021-09-12 MED ORDER — LORAZEPAM 2 MG/ML IJ SOLN: 0.0000 mg | Freq: Four times a day (QID) | INTRAMUSCULAR | Status: AC

## 2021-09-12 MED ORDER — METFORMIN HCL ER 500 MG PO TB24
500.0000 mg | ORAL_TABLET | Freq: Two times a day (BID) | ORAL | Status: DC
  Administered 2021-09-13: 500 mg via ORAL
  Filled 2021-09-12: qty 1

## 2021-09-12 MED ORDER — CARVEDILOL 6.25 MG PO TABS
3.1250 mg | ORAL_TABLET | Freq: Two times a day (BID) | ORAL | Status: DC
Start: 2021-09-13 — End: 2021-09-16
  Administered 2021-09-13 – 2021-09-16 (×7): 3.125 mg via ORAL
  Filled 2021-09-12 (×7): qty 1

## 2021-09-12 MED ORDER — SODIUM CHLORIDE 0.9 % IV BOLUS (SEPSIS)
1000.0000 mL | Freq: Once | INTRAVENOUS | Status: AC
  Administered 2021-09-12: 1000 mL via INTRAVENOUS

## 2021-09-12 MED ORDER — ATORVASTATIN CALCIUM 20 MG PO TABS
20.0000 mg | ORAL_TABLET | Freq: Every day | ORAL | Status: DC
  Administered 2021-09-13 – 2021-09-16 (×4): 20 mg via ORAL
  Filled 2021-09-12 (×5): qty 1

## 2021-09-12 MED ORDER — TRAZODONE HCL 50 MG PO TABS
25.0000 mg | ORAL_TABLET | Freq: Every evening | ORAL | Status: DC | PRN
  Administered 2021-09-14: 50 mg via ORAL
  Filled 2021-09-12: qty 1

## 2021-09-12 MED ORDER — LOSARTAN POTASSIUM-HCTZ 100-25 MG PO TABS
1.0000 | ORAL_TABLET | Freq: Every day | ORAL | Status: DC
Start: 2021-09-13 — End: 2021-09-12

## 2021-09-12 MED ORDER — POTASSIUM CHLORIDE CRYS ER 20 MEQ PO TBCR
40.0000 meq | EXTENDED_RELEASE_TABLET | Freq: Once | ORAL | Status: AC
Start: 2021-09-12 — End: 2021-09-12
  Administered 2021-09-12: 40 meq via ORAL
  Filled 2021-09-12: qty 2

## 2021-09-12 MED ORDER — LORAZEPAM 2 MG PO TABS
0.0000 mg | ORAL_TABLET | Freq: Four times a day (QID) | ORAL | Status: AC
  Administered 2021-09-12: 2 mg via ORAL
  Administered 2021-09-13: 1 mg via ORAL
  Administered 2021-09-14 (×2): 2 mg via ORAL
  Filled 2021-09-12 (×4): qty 1

## 2021-09-12 MED ORDER — LOSARTAN POTASSIUM 50 MG PO TABS
100.0000 mg | ORAL_TABLET | Freq: Every day | ORAL | Status: DC
  Administered 2021-09-13 – 2021-09-16 (×4): 100 mg via ORAL
  Filled 2021-09-12 (×4): qty 2

## 2021-09-12 MED ORDER — INSULIN GLARGINE-YFGN 100 UNIT/ML ~~LOC~~ SOLN
20.0000 [IU] | Freq: Every day | SUBCUTANEOUS | Status: DC
  Filled 2021-09-12: qty 0.2

## 2021-09-12 MED ORDER — THIAMINE HCL 100 MG PO TABS
100.0000 mg | ORAL_TABLET | Freq: Every day | ORAL | Status: DC
  Administered 2021-09-12 – 2021-09-16 (×5): 100 mg via ORAL
  Filled 2021-09-12 (×6): qty 1

## 2021-09-12 MED ORDER — ADULT MULTIVITAMIN W/MINERALS CH
1.0000 | ORAL_TABLET | Freq: Every day | ORAL | Status: DC
  Administered 2021-09-13 – 2021-09-16 (×4): 1 via ORAL
  Filled 2021-09-12 (×5): qty 1

## 2021-09-12 MED ORDER — LORAZEPAM 2 MG PO TABS
0.0000 mg | ORAL_TABLET | Freq: Two times a day (BID) | ORAL | Status: AC
  Administered 2021-09-15: 2 mg via ORAL
  Administered 2021-09-15: 4 mg via ORAL
  Filled 2021-09-12: qty 2
  Filled 2021-09-12: qty 1
  Filled 2021-09-12: qty 2

## 2021-09-12 MED ORDER — PAROXETINE HCL 30 MG PO TABS
30.0000 mg | ORAL_TABLET | Freq: Every day | ORAL | Status: DC
Start: 2021-09-13 — End: 2021-09-16
  Administered 2021-09-13 – 2021-09-16 (×4): 30 mg via ORAL
  Filled 2021-09-12 (×4): qty 1

## 2021-09-12 MED ORDER — AMLODIPINE BESYLATE 5 MG PO TABS
2.5000 mg | ORAL_TABLET | Freq: Every day | ORAL | Status: DC
  Administered 2021-09-13 – 2021-09-16 (×4): 2.5 mg via ORAL
  Filled 2021-09-12 (×5): qty 1

## 2021-09-12 NOTE — ED Provider Notes (Signed)
? ?[facility] ?Provider Note ? ? ? Event Date/Time  ? First MD Initiated Contact with Patient 09/12/21 2022   ?  (approximate) ? ? ?History  ? ?detox ? ? ?HPI ? ?Sharon Melton is a 61 y.o. female with a history of diabetes who presents requesting alcohol detox.  Patient reports she drinks daily.  She reports her husband recently filmed her 'blacking out 'and that it upset her and realized that she needed to stop.  No history of seizures.  She does get tremulous if she does not drink.  No physical complaints at this time ?  ? ? ?Physical Exam  ? ?Triage Vital Signs: ?ED Triage Vitals [09/12/21 2010]  ?Enc Vitals Group  ?   BP (!) 204/108  ?   Pulse Rate 98  ?   Resp 16  ?   Temp 99.1 ?F (37.3 ?C)  ?   Temp Source Oral  ?   SpO2 95 %  ?   Weight 72.1 kg (159 lb)  ?   Height 1.651 m (5\' 5" )  ?   Head Circumference   ?   Peak Flow   ?   Pain Score 0  ?   Pain Loc   ?   Pain Edu?   ?   Excl. in GC?   ? ? ?Most recent vital signs: ?Vitals:  ? 09/12/21 2010  ?BP: (!) 204/108  ?Pulse: 98  ?Resp: 16  ?Temp: 99.1 ?F (37.3 ?C)  ?SpO2: 95%  ? ? ? ?General: Awake, no distress.  ?CV:  Good peripheral perfusion.  ?Resp:  Normal effort.  ?Abd:  No distention.  ?Other:   ? ? ?ED Results / Procedures / Treatments  ? ?Labs ?(all labs ordered are listed, but only abnormal results are displayed) ?Labs Reviewed  ?COMPREHENSIVE METABOLIC PANEL - Abnormal; Notable for the following components:  ?    Result Value  ? Sodium 129 (*)   ? Potassium 2.4 (*)   ? Chloride 89 (*)   ? Glucose, Bld 567 (*)   ? AST 130 (*)   ? ALT 49 (*)   ? Alkaline Phosphatase 151 (*)   ? Anion gap 17 (*)   ? All other components within normal limits  ?ETHANOL - Abnormal; Notable for the following components:  ? Alcohol, Ethyl (B) 15 (*)   ? All other components within normal limits  ?CBC - Abnormal; Notable for the following components:  ? RBC 5.14 (*)   ? MCV 77.6 (*)   ? MCH 25.5 (*)   ? All other components within normal limits  ?URINE  DRUG SCREEN, QUALITATIVE ([facility] ONLY) - Abnormal; Notable for the following components:  ? Cannabinoid 50 Ng, Ur [place] POSITIVE (*)   ? All other components within normal limits  ?BLOOD GAS, VENOUS - Abnormal; Notable for the following components:  ? pH, Ven 7.52 (*)   ? pCO2, Ven 40 (*)   ? Bicarbonate 32.7 (*)   ? Acid-Base Excess 9.0 (*)   ? All other components within normal limits  ?BETA-HYDROXYBUTYRIC ACID - Abnormal; Notable for the following components:  ? Beta-Hydroxybutyric Acid 1.22 (*)   ? All other components within normal limits  ?RESP PANEL BY RT-PCR (FLU A&B, COVID) ARPGX2  ?MAGNESIUM  ?CBG MONITORING, ED  ? ? ? ?EKG ? ? ? ? ?RADIOLOGY ? ? ? ? ?PROCEDURES: ? ?Critical Care performed:  ? ?Procedures ? ? ?MEDICATIONS ORDERED IN ED: ?Medications  ?LORazepam (ATIVAN)  injection 0-4 mg ( Intravenous See Alternative 09/12/21 2144)  ?  Or  ?LORazepam (ATIVAN) tablet 0-4 mg (0 mg Oral Not Given 09/12/21 2144)  ?LORazepam (ATIVAN) injection 0-4 mg (has no administration in time range)  ?  Or  ?LORazepam (ATIVAN) tablet 0-4 mg (has no administration in time range)  ?thiamine tablet 100 mg (100 mg Oral Given 09/12/21 2151)  ?  Or  ?thiamine (B-1) injection 100 mg ( Intravenous See Alternative 09/12/21 2151)  ?potassium chloride 10 mEq in 100 mL IVPB (has no administration in time range)  ?0.9 %  sodium chloride infusion (1,000 mLs Intravenous New Bag/Given 09/12/21 2151)  ?potassium chloride SA (KLOR-CON M) CR tablet 40 mEq (40 mEq Oral Given 09/12/21 2151)  ?insulin aspart (novoLOG) injection 10 Units (10 Units Intravenous Given 09/12/21 2218)  ? ? ? ?IMPRESSION / MDM / ASSESSMENT AND PLAN / ED COURSE  ?I reviewed the triage vital signs and the nursing notes. ? ?Patient with a history of diabetes presents for alcohol detox ? ? ?Overall she is well-appearing and in no acute distress, exam is overall reassuring.  Pending lab work ? ?Notified of hypokalemia and hyperglycemia, glucose was 567.  Anion gap is 17 however CO2 is  normal.  Will send VBG and beta hydroxybutyric acid ? ?IV fluids infusing ? ?VBG demonstrates pH of 7.52, beta hydroxy slightly over 1, not consistent with DKA, will give IV insulin, IV fluids, p.o. potassium ? ? ? ?  ? ? ?FINAL CLINICAL IMPRESSION(S) / ED DIAGNOSES  ? ?Final diagnoses:  ?Hypokalemia  ?Hyperglycemia  ?Alcohol abuse  ? ? ? ?Rx / DC Orders  ? ?ED Discharge Orders   ? ? None  ? ?  ? ? ? ?Note:  This document was prepared using Dragon voice recognition software and may include unintentional dictation errors. ?  ?Jene Every, MD ?09/12/21 2307 ? ?

## 2021-09-12 NOTE — ED Notes (Signed)
Patient reports she is seeking help with alcohol, states she has been drinking off/on since her late 20's.  Reports that in the past she has stopped drinking on her own, the longest period was for approximately 10 years.  Patient reports stress and anxiety are usually triggers for her.  Patient states she usually drinks 1/3 of a large bottle of Vodka daily.  States her husband showed her videos that he had taped of some of her behaviors while she had been drinking and she didn't want to drink any more. ?

## 2021-09-12 NOTE — ED Provider Notes (Signed)
11:30 PM  Assumed care at shift change.  Patient here for alcohol detox.  Found to be hypokalemic, hyperglycemic.  Has elevated anion gap but normal pH and bicarb.  Doubt DKA.  Getting IV fluids, oral and IV potassium.  We will recheck labs after intervention.  TTS has been consulted. ? ?3:40 AM  Pt's repeat potassium is still significantly low at 2.5.  EKG shows a prolonged QT interval of 502 ms which is new for her.  Given she still has hypokalemia with EKG changes, will medically admit. ? ?She tells me that she has been having intermittent palpitations and dizziness.  No chest pain or shortness of breath.  No vomiting or diarrhea.  It appears she is on hydrochlorothiazide.  We will hold any further diuretics. ? ?She is on CIWA.  No signs of active withdrawal at this time.  She had been seen by TTS for rehab/detox and has already been provided with outpatient resources. ? ? ?4:00 AM  Consulted and discussed patient's case with hospitalist, Dr. Arville Care.  I have recommended admission and consulting physician agrees and will place admission orders.  Patient (and family if present) agree with this plan.  ? ?I reviewed all nursing notes, vitals, pertinent previous records.  All labs, EKGs, imaging ordered have been independently reviewed and interpreted by myself. ? ? ? ?CRITICAL CARE ?Performed by: Baxter Hire Jayant Kriz ? ? ?Total critical care time: 40 minutes ? ?Critical care time was exclusive of separately billable procedures and treating other patients. ? ?Critical care was necessary to treat or prevent imminent or life-threatening deterioration. ? ?Critical care was time spent personally by me on the following activities: development of treatment plan with patient and/or surrogate as well as nursing, discussions with consultants, evaluation of patient's response to treatment, examination of patient, obtaining history from patient or surrogate, ordering and performing treatments and interventions, ordering and review of  laboratory studies, ordering and review of radiographic studies, pulse oximetry and re-evaluation of patient's condition. ? ? ? ? Date: 09/13/2021 3:52 AM ? Rate: 70 ? Rhythm: normal sinus rhythm ? QRS Axis: normal ? Intervals: QTc 502 ms ? ST/T Wave abnormalities: normal ? Conduction Disutrbances: none ? Narrative Interpretation: Q waves in inferior leads, prolonged QT interval ? ? ? ?  ?Kattia Selley, Layla Maw, DO ?09/13/21 0406 ? ?

## 2021-09-12 NOTE — ED Triage Notes (Signed)
FIRST NURSE NOTE:  Pt arrived to ED with family member reports needing help with detox from alcohol.  Pt's family member left after pt checked in. Pt ambulatory with a steady gait, no distress noted.  ?

## 2021-09-12 NOTE — ED Triage Notes (Signed)
Pt states she wants help to detox from alcohol. Pt states has never had a seizure from lack of alcohol that she is aware of. No tremors noted. Pt states last etoh an hour ago.  ?

## 2021-09-13 ENCOUNTER — Encounter: Payer: Self-pay | Admitting: Internal Medicine

## 2021-09-13 DIAGNOSIS — I10 Essential (primary) hypertension: Secondary | ICD-10-CM | POA: Diagnosis present

## 2021-09-13 DIAGNOSIS — F1010 Alcohol abuse, uncomplicated: Secondary | ICD-10-CM

## 2021-09-13 DIAGNOSIS — E785 Hyperlipidemia, unspecified: Secondary | ICD-10-CM | POA: Diagnosis present

## 2021-09-13 DIAGNOSIS — R7989 Other specified abnormal findings of blood chemistry: Secondary | ICD-10-CM | POA: Diagnosis present

## 2021-09-13 DIAGNOSIS — E119 Type 2 diabetes mellitus without complications: Secondary | ICD-10-CM

## 2021-09-13 DIAGNOSIS — F10939 Alcohol use, unspecified with withdrawal, unspecified: Secondary | ICD-10-CM | POA: Diagnosis present

## 2021-09-13 DIAGNOSIS — E876 Hypokalemia: Secondary | ICD-10-CM | POA: Diagnosis present

## 2021-09-13 HISTORY — DX: Alcohol use, unspecified with withdrawal, unspecified: F10.939

## 2021-09-13 LAB — BASIC METABOLIC PANEL
Anion gap: 8 (ref 5–15)
Anion gap: 8 (ref 5–15)
BUN: 9 mg/dL (ref 8–23)
BUN: 8 mg/dL (ref 8–23)
CO2: 26 mmol/L (ref 22–32)
CO2: 24 mmol/L (ref 22–32)
Calcium: 8.7 mg/dL — ABNORMAL LOW (ref 8.9–10.3)
Calcium: 8.6 mg/dL — ABNORMAL LOW (ref 8.9–10.3)
Chloride: 101 mmol/L (ref 98–111)
Chloride: 104 mmol/L (ref 98–111)
Creatinine, Ser: 0.66 mg/dL (ref 0.44–1.00)
Creatinine, Ser: 0.68 mg/dL (ref 0.44–1.00)
GFR, Estimated: >60 mL/min (ref >60)
GFR, Estimated: >60 mL/min (ref >60)
Glucose, Bld: 285 mg/dL — ABNORMAL HIGH (ref 70–99)
Glucose, Bld: 186 mg/dL — ABNORMAL HIGH (ref 70–99)
Potassium: 2.5 mmol/L — CL (ref 3.5–5.1)
Potassium: 3.1 mmol/L — ABNORMAL LOW (ref 3.5–5.1)
Sodium: 135 mmol/L (ref 135–145)
Sodium: 136 mmol/L (ref 135–145)

## 2021-09-13 LAB — CBG MONITORING, ED
Glucose-Capillary: 302 mg/dL — ABNORMAL HIGH (ref 70–99)
Glucose-Capillary: 305 mg/dL — ABNORMAL HIGH (ref 70–99)
Glucose-Capillary: 428 mg/dL — ABNORMAL HIGH (ref 70–99)
Glucose-Capillary: 448 mg/dL — ABNORMAL HIGH (ref 70–99)
Glucose-Capillary: 486 mg/dL — ABNORMAL HIGH (ref 70–99)

## 2021-09-13 LAB — GLUCOSE, CAPILLARY
Glucose-Capillary: 224 mg/dL — ABNORMAL HIGH (ref 70–99)
Glucose-Capillary: 239 mg/dL — ABNORMAL HIGH (ref 70–99)

## 2021-09-13 LAB — PHOSPHORUS: Phosphorus: <1 mg/dL — CL (ref 2.5–4.6)

## 2021-09-13 MED ORDER — HYDRALAZINE HCL 20 MG/ML IJ SOLN: 5.0000 mg | INTRAMUSCULAR | Status: DC | PRN

## 2021-09-13 MED ORDER — DIPHENHYDRAMINE HCL 50 MG/ML IJ SOLN
12.5000 mg | Freq: Three times a day (TID) | INTRAMUSCULAR | Status: DC | PRN
Start: 2021-09-13 — End: 2021-09-16

## 2021-09-13 MED ORDER — INSULIN ASPART 100 UNIT/ML IJ SOLN: 0.0000 [IU] | Freq: Every day | INTRAMUSCULAR | Status: DC

## 2021-09-13 MED ORDER — POTASSIUM CHLORIDE CRYS ER 20 MEQ PO TBCR
40.0000 meq | EXTENDED_RELEASE_TABLET | Freq: Once | ORAL | Status: AC
  Administered 2021-09-13: 40 meq via ORAL
  Filled 2021-09-13: qty 2

## 2021-09-13 MED ORDER — INSULIN GLARGINE-YFGN 100 UNIT/ML ~~LOC~~ SOLN
20.0000 [IU] | Freq: Every day | SUBCUTANEOUS | Status: DC
  Administered 2021-09-13 – 2021-09-14 (×2): 20 [IU] via SUBCUTANEOUS
  Filled 2021-09-13 (×2): qty 0.2

## 2021-09-13 MED ORDER — MAGNESIUM SULFATE IN D5W 1-5 GM/100ML-% IV SOLN
1.0000 g | Freq: Once | INTRAVENOUS | Status: AC
  Administered 2021-09-13: 1 g via INTRAVENOUS
  Filled 2021-09-13: qty 100

## 2021-09-13 MED ORDER — CHLORDIAZEPOXIDE HCL 10 MG PO CAPS
10.0000 mg | ORAL_CAPSULE | Freq: Three times a day (TID) | ORAL | Status: DC
  Administered 2021-09-13 – 2021-09-16 (×10): 10 mg via ORAL
  Filled 2021-09-13 (×3): qty 1
  Filled 2021-09-13: qty 2
  Filled 2021-09-13: qty 1
  Filled 2021-09-13: qty 2
  Filled 2021-09-13 (×4): qty 1

## 2021-09-13 MED ORDER — POTASSIUM PHOSPHATES 15 MMOLE/5ML IV SOLN
30.0000 mmol | Freq: Once | INTRAVENOUS | Status: AC
  Administered 2021-09-13: 30 mmol via INTRAVENOUS
  Filled 2021-09-13: qty 10

## 2021-09-13 MED ORDER — INSULIN ASPART 100 UNIT/ML IJ SOLN
0.0000 [IU] | Freq: Three times a day (TID) | INTRAMUSCULAR | Status: DC
  Administered 2021-09-13: 15 [IU] via SUBCUTANEOUS
  Filled 2021-09-13 (×2): qty 1

## 2021-09-13 MED ORDER — IBUPROFEN 400 MG PO TABS
200.0000 mg | ORAL_TABLET | Freq: Four times a day (QID) | ORAL | Status: DC | PRN
  Administered 2021-09-14 (×2): 200 mg via ORAL
  Filled 2021-09-13 (×3): qty 1

## 2021-09-13 MED ORDER — SODIUM CHLORIDE 0.9 % IV SOLN
INTRAVENOUS | Status: DC
Start: 2021-09-13 — End: 2021-09-15

## 2021-09-13 MED ORDER — INSULIN ASPART 100 UNIT/ML IJ SOLN: 0.0000 [IU] | Freq: Three times a day (TID) | INTRAMUSCULAR | Status: DC

## 2021-09-13 MED ORDER — SODIUM CHLORIDE 0.45 % IV SOLN: INTRAVENOUS | Status: DC

## 2021-09-13 MED ORDER — POTASSIUM CHLORIDE 10 MEQ/100ML IV SOLN
10.0000 meq | INTRAVENOUS | Status: AC
  Administered 2021-09-13 (×2): 10 meq via INTRAVENOUS
  Filled 2021-09-13 (×2): qty 100

## 2021-09-13 MED ORDER — INSULIN ASPART 100 UNIT/ML IJ SOLN
0.0000 [IU] | Freq: Three times a day (TID) | INTRAMUSCULAR | Status: DC
  Administered 2021-09-13: 3 [IU] via SUBCUTANEOUS
  Administered 2021-09-13: 11 [IU] via SUBCUTANEOUS
  Administered 2021-09-14 (×2): 8 [IU] via SUBCUTANEOUS
  Administered 2021-09-14: 5 [IU] via SUBCUTANEOUS
  Administered 2021-09-15 (×2): 2 [IU] via SUBCUTANEOUS
  Administered 2021-09-15: 5 [IU] via SUBCUTANEOUS
  Administered 2021-09-16: 2 [IU] via SUBCUTANEOUS
  Administered 2021-09-16: 5 [IU] via SUBCUTANEOUS
  Filled 2021-09-13 (×8): qty 1

## 2021-09-13 MED ORDER — SODIUM CHLORIDE 0.9 % IV BOLUS
1000.0000 mL | Freq: Once | INTRAVENOUS | Status: AC
  Administered 2021-09-13: 1000 mL via INTRAVENOUS

## 2021-09-13 MED ORDER — ENOXAPARIN SODIUM 40 MG/0.4ML IJ SOSY
40.0000 mg | PREFILLED_SYRINGE | INTRAMUSCULAR | Status: DC
  Administered 2021-09-13 – 2021-09-15 (×3): 40 mg via SUBCUTANEOUS
  Filled 2021-09-13 (×3): qty 0.4

## 2021-09-13 MED ORDER — INSULIN GLARGINE-YFGN 100 UNIT/ML ~~LOC~~ SOLN
15.0000 [IU] | Freq: Every day | SUBCUTANEOUS | Status: DC
  Filled 2021-09-13: qty 0.15

## 2021-09-13 MED ORDER — INSULIN ASPART 100 UNIT/ML IJ SOLN
0.0000 [IU] | Freq: Every day | INTRAMUSCULAR | Status: DC
  Administered 2021-09-13: 2 [IU] via SUBCUTANEOUS
  Filled 2021-09-13: qty 1

## 2021-09-13 NOTE — Progress Notes (Signed)
Inpatient Diabetes Program Recommendations ? ?AACE/ADA: New Consensus Statement on Inpatient Glycemic Control  ?Target Ranges:  Prepandial:   less than 140 mg/dL ?     Peak postprandial:   less than 180 mg/dL (1-2 hours) ?     Critically ill patients:  140 - 180 mg/dL  ? ? Latest Reference Range & Units 09/13/21 00:57 09/13/21 07:55 09/13/21 08:02 09/13/21 10:05 09/13/21 11:36  ?Glucose-Capillary 70 - 99 mg/dL 305 (H) 448 (H) 486 (H) 428 (H) 302 (H)  ? ?Review of Glycemic Control ? ?Diabetes history: DM2 ?Outpatient Diabetes medications: Tresiba 20 units daily, Metformin 500 mg BID (not taken since January b/c not able to afford) ?Current orders for Inpatient glycemic control: Semglee 20 units daily, Novolog 0-15 units TID with meals, Novolog 0-5 units QHS ? ?Inpatient Diabetes Program Recommendations:   ? ?Insulin: If post prandial glucose is consistently over 180 mg/dl, please consider ordering Novolog 4 units TID with meals for meal coverage if patient eats at least 50% of meals. ? ?Outpatient DM medications: Patient will need Rx for all DM medications prescribed at discharge.  ? ?NOTE: Spoke with patient at bedside in ED about diabetes and home regimen for diabetes control. Patient reports she was taking [place] 20 units daily and Metformin 500 mg BID for DM but she states she ran out of medications in January 2023 and not able to afford the medications.  Patient reports that she has a PCP but it cost $80-127 to see PCP so she can not afford to see PCP very often. Patient reports that she had been getting [facility] from [facility]) but she could not get medications from them until she provides her tax return. Patient reports that she just got her taxes done so she will be able to provide them with the tax returns.  Patient reports that she has never been to [facility] or been told about any clinics for uninsured.  Informed patient that TOC would be consulted to see if they can assist with  getting her connected to clinic for uninsured and to assist with getting medications at discharge. Patient reports she has lost her glucometer; will ask TOC to provide glucose monitoring kit if they have on hand. Patient reports that when she was taking her [place] and Metformin as prescribed her glucose was running fairly well. Discussed glucose and A1C goals. Discussed importance of checking CBGs and maintaining good CBG control to prevent long-term and short-term complications. Encouraged patient to take medications as prescribed, to provide needed documents to [facility], and if TOC can get her established with clinic for uninsured to follow up consistently to ensure she can continue to get needed prescriptions.  Patient verbalized understanding of information discussed and reports no further questions at this time related to diabetes. ? ?Thanks, ?Barnie Alderman, RN, MSN, CDE ?Diabetes Coordinator ?Inpatient Diabetes Program ?[phone] (Team Pager) ? ? ? ? ?

## 2021-09-13 NOTE — Assessment & Plan Note (Addendum)
Potassium replaced up to 3.6.  Patient was given 2 doses of Neutra-Phos upon discharge home ?

## 2021-09-13 NOTE — Assessment & Plan Note (Deleted)
Lipitor 

## 2021-09-13 NOTE — Assessment & Plan Note (Addendum)
Patient interested in quitting alcohol.  Librium taper prescribed upon discharge ? ?

## 2021-09-13 NOTE — Assessment & Plan Note (Deleted)
Patient on Semglee insulin 25 units daily with short acting insulin plus sliding scale before meals.  Plans for sugars 275, 126, 141 and 226. ?

## 2021-09-13 NOTE — Assessment & Plan Note (Addendum)
Continue Plavix and Lipitor

## 2021-09-13 NOTE — BH Assessment (Addendum)
This Clinical research associate spoke with [facility] Loraine Leriche who reports female beds available tomorrow morning 09/13/21 and to check back then. ? ?TTS to refer patient to [facility] once patient is medically cleared, patient is receptive ? ?Update: 3:45am per EDP Dr. Elesa Massed patient is not yet medically cleared ? ?Update: 3:59am per EDP Dr. Elesa Massed patient will have to be admitted medically, this writer had previously given patient outpatient SA referrals to [facility] in case patient would be admitted medically, patient was receptive to outpatient referrals. ? ?TTS to sign off ?

## 2021-09-13 NOTE — ED Notes (Signed)
RN informed bed assigned 

## 2021-09-13 NOTE — Assessment & Plan Note (Addendum)
Patient has mild abnormal liver function.  Most likely due to alcohol abuse. ?-Avoid using Tylenol ? ?

## 2021-09-13 NOTE — Assessment & Plan Note (Addendum)
Hold HCTZ with electrolyte abnormalities ?Continue amlodipine, Coreg, Cozaar ?

## 2021-09-13 NOTE — H&P (Signed)
?History and Physical  ? ? ?Sharon Melton JFH:[id number] DOB: March 12, 1961 DOA: 09/12/2021 ? ?Referring MD/NP/PA:  ? ?PCP: Crecencio Mc, MD  ? ?Patient coming from:  The patient is coming from home.  At baseline, pt is independent for most of ADL.       ? ?Chief Complaint: alcohol withdraw and wants alcohol detox  ? ?HPI: Sharon Melton is a 61 y.o. female with medical history significant of alcohol abuse, HLD, HTN, DM, CAD, who presents with alcohol withdraw. ? ?Patient states that she has been heavily drinking off/on since her late 20's. Patient states she usually drinks 1/3 of a large bottle of Vodka daily. Her last drinking was yesterday afternoon.  She states that she currently feels tremulous, anxious, with mild dizziness, flushing and heart racing. No history of seizures.  She states that she wants alcohol detox treatment.  Patient denies chest pain, cough, shortness breath.  No fever or chills.  She has nausea, no vomiting, diarrhea or abdominal pain.  No symptoms of UTI.  ? ?Data Reviewed and ED Course: pt was found to have WBC 6.9, negative COVID PCR, alcohol level 15, beta hydroxybutyric acid 1.22, abnormal liver function (ALP 151, AST 130, ALT 49, total bilirubin 0.8), UDS positive for cannabinoid, pseudohyponatremia, potassium 2.5, magnesium 1.7, GFR> 60, temperature 99.1, blood pressure 104/108, 136/50, heart rate 98, RR 22, oxygen saturation 96% on room air. ? ? ?EKG:  Not done yet, will get one.    ? ? ?Review of Systems:  ? ?General: no fevers, chills, no body weight gain, has fatigue ?HEENT: no blurry vision, hearing changes or sore throat ?Respiratory: no dyspnea, coughing, wheezing ?CV: no chest pain, has palpitations ?GI: has nausea, no vomiting, abdominal pain, diarrhea, constipation ?GU: no dysuria, burning on urination, increased urinary frequency, hematuria  ?Ext: no leg edema ?Neuro: no unilateral weakness, numbness, or tingling, no vision change or hearing loss. Has dizziness ?Skin: no  rash, no skin tear. ?MSK: No muscle spasm, no deformity, no limitation of range of movement in spin ?Heme: No easy bruising.  ?Travel history: No recent long distant travel. ? ? ?Allergy: No Known Allergies ? ?Past Medical History:  ?Diagnosis Date  ? Diabetes mellitus   ? Hyperlipidemia   ? Hypertension   ? Myocardial infarction [facility]) 02/2007  ? Inferior STEMI with bare metal stent to the PDA placed at [place]  ? ? ?Past Surgical History:  ?Procedure Laterality Date  ? SPINE SURGERY    ? lumbar  ? ? ?Social History:  reports that she quit smoking about 14 years ago. Her smoking use included cigarettes. She has never used smokeless tobacco. She reports current alcohol use. She reports current drug use. ? ?Family History:  ?Family History  ?Problem Relation Age of Onset  ? Mental illness Mother   ? Heart disease Mother   ? COPD Maternal Uncle   ? Heart disease Maternal Grandmother   ?     first MI at 55  ? Hearing loss Maternal Grandfather   ? Heart disease Paternal Grandfather   ?     65  ?  ? ?Prior to Admission medications   ?Medication Sig Start Date End Date Taking? Authorizing Provider  ?Multiple Vitamin (MULTIVITAMIN) tablet Take 1 tablet by mouth daily.   Yes [provider]  ?Phenyleph-Doxylamine-DM-APAP (ALKA-SELTZER PLS ALLERGY & [facility] PO) Take 1 Container by mouth as needed.   Yes [provider]  ?amLODipine (NORVASC) 2.5 MG tablet Take 1 tablet (2.5  mg total) by mouth once daily. ?Patient not taking: Reported on 09/12/2021 07/23/21   Crecencio Mc, MD  ?atorvastatin (LIPITOR) 20 MG tablet Take 1 tablet (20 mg total) by mouth once daily. ?Patient not taking: Reported on 09/12/2021 07/23/21   Crecencio Mc, MD  ?blood glucose meter kit and supplies KIT Use as directed to check blood sugar up to four times daily. ?Patient not taking: Reported on 09/12/2021 04/23/21   Crecencio Mc, MD  ?carvedilol (COREG) 3.125 MG tablet Take 1 tablet (3.125 mg total) by mouth 2 (two) times daily with a  meal. ?Patient not taking: Reported on 09/12/2021 07/23/21   Crecencio Mc, MD  ?clopidogrel (PLAVIX) 75 MG tablet Take 1 tablet (75 mg total) by mouth once daily. ?Patient not taking: Reported on 09/12/2021 07/23/21   Crecencio Mc, MD  ?glucose blood (RIGHTEST [phone] BLOOD GLUCOSE) test strip Use as directed to check blood sugar up to four times daily. 04/23/21   Crecencio Mc, MD  ?insulin degludec (TRESIBA FLEXTOUCH) 100 UNIT/ML FlexTouch Pen Inject 20 Units into the skin daily. ?Patient not taking: Reported on 09/12/2021 07/23/21   Crecencio Mc, MD  ?Insulin Pen Needle 32G X 4 MM MISC USE AS DIRECTED FOR INSULIN ONCE DAILY. 07/23/21   Crecencio Mc, MD  ?losartan-hydrochlorothiazide (HYZAAR) 100-25 MG tablet Take 1 tablet by mouth daily. ?Patient not taking: Reported on 09/12/2021 07/23/21   Crecencio Mc, MD  ?metFORMIN (GLUCOPHAGE-XR) 500 MG 24 hr tablet Take 1 tablet (500 mg total) by mouth 2 (two) times daily. ?Patient not taking: Reported on 09/12/2021 07/23/21   Crecencio Mc, MD  ?PARoxetine (PAXIL) 30 MG tablet Take 1 tablet (30 mg total) by mouth once daily. ?Patient not taking: Reported on 09/12/2021 07/23/21   Crecencio Mc, MD  ?Rightest GL300 Lancets MISC Use as directed to check blood sugar up to four times daily. 04/23/21   Crecencio Mc, MD  ?traZODone (DESYREL) 50 MG tablet Take (1/2) to 1 tablet (25-50 mg total) by mouth once daily at bedtime as needed for sleep. ?Patient not taking: Reported on 09/12/2021 07/23/21   Crecencio Mc, MD  ? ? ?Physical Exam: ?Vitals:  ? 09/13/21 0100 09/13/21 0353 09/13/21 0630 09/13/21 0700  ?BP: (!) 143/67  (!) 158/79 (!) 136/50  ?Pulse: 89  84   ?Resp: 18 20 (!) 21 (!) 22  ?Temp:      ?TempSrc:      ?SpO2: 96%  96%   ?Weight:      ?Height:      ? ?General: Patient is mildly tremulous ?HEENT: ?      Eyes: PERRL, EOMI, no scleral icterus. ?      ENT: No discharge from the ears and nose, no pharynx injection, no tonsillar enlargement.  ?      Neck: No JVD, no bruit,  no mass felt. ?Heme: No neck lymph node enlargement. ?Cardiac: S1/S2, RRR, No murmurs, No gallops or rubs. ?Respiratory: No rales, wheezing, rhonchi or rubs. ?GI: Soft, nondistended, nontender, no rebound pain, no organomegaly, BS present. ?GU: No hematuria ?Ext: No pitting leg edema bilaterally. 1+DP/PT pulse bilaterally. ?Musculoskeletal: No joint deformities, No joint redness or warmth, no limitation of ROM in spin. ?Skin: No rashes.  ?Neuro: Alert, oriented X3, cranial nerves II-XII grossly intact, moves all extremities normally ?Psych: Patient is not psychotic, no suicidal or hemocidal ideation. ? ?Labs on Admission: I have personally reviewed following labs and imaging studies ? ?CBC: ?  Recent Labs  ?Lab 09/12/21 ?2015  ?WBC 6.9  ?HGB 13.1  ?HCT 39.9  ?MCV 77.6*  ?PLT 225  ? ?Basic Metabolic Panel: ?Recent Labs  ?Lab 09/12/21 ?2015 09/12/21 ?2145 09/13/21 ?0200  ?NA 129*  --  135  ?K 2.4*  --  2.5*  ?CL 89*  --  101  ?CO2 23  --  26  ?GLUCOSE 567*  --  285*  ?BUN 12  --  9  ?CREATININE 0.76  --  0.66  ?CALCIUM 10.3  --  8.7*  ?MG  --  1.7  --   ? ?GFR: ?Estimated Creatinine Clearance: 72.5 mL/min (by C-G formula based on SCr of 0.66 mg/dL). ?Liver Function Tests: ?Recent Labs  ?Lab 09/12/21 ?2015  ?AST 130*  ?ALT 49*  ?ALKPHOS 151*  ?BILITOT 0.8  ?PROT 7.3  ?ALBUMIN 3.8  ? ?No results for input(s): LIPASE, AMYLASE in the last 168 hours. ?No results for input(s): AMMONIA in the last 168 hours. ?Coagulation Profile: ?No results for input(s): INR, PROTIME in the last 168 hours. ?Cardiac Enzymes: ?No results for input(s): CKTOTAL, CKMB, CKMBINDEX, TROPONINI in the last 168 hours. ?BNP (last 3 results) ?No results for input(s): PROBNP in the last 8760 hours. ?HbA1C: ?No results for input(s): HGBA1C in the last 72 hours. ?CBG: ?Recent Labs  ?Lab 09/13/21 ?3142 09/13/21 ?7670 09/13/21 ?0802  ?GLUCAP 305* 448* 486*  ? ?Lipid Profile: ?No results for input(s): CHOL, HDL, LDLCALC, TRIG, CHOLHDL, LDLDIRECT in the last 72  hours. ?Thyroid Function Tests: ?No results for input(s): TSH, T4TOTAL, FREET4, T3FREE, THYROIDAB in the last 72 hours. ?Anemia Panel: ?No results for input(s): VITAMINB12, FOLATE, FERRITIN, TIBC, IRON, RETIC

## 2021-09-13 NOTE — ED Notes (Addendum)
Dr. Clyde Lundborg notified in person of pts cbg 448 and 486 this am. ?

## 2021-09-13 NOTE — Progress Notes (Signed)
Admission profile updated. ?

## 2021-09-13 NOTE — BH Assessment (Signed)
Comprehensive Clinical Assessment (CCA) Note ? ?09/13/2021 ?Sharon Melton ?[id number] ? ?Chief Complaint: Patient is a 61 year old female presenting to [facility] ED voluntarily requesting detox. Per triage note Pt arrived to ED with family member reports needing help with detox from alcohol. During assessment patient appears alert and oriented x4, calm and cooperative. Patient reports that she drinks alcohol daily "a quart or a 3rd of that of vodka." Patient reports that she started drinking in her 90's but has 10 years sobriety in her past. Patient report her last drink being "this afternoon" 09/12/21. Patient reports current withdrawal of "just some itching right now" she reports past withdrawal symptoms of nausea and cold chills. Patient's current BAL is 15. ?Chief Complaint  ?Patient presents with  ? detox  ? ?Visit Diagnosis: Alcohol Use Disorder, severe  ? ? ?CCA Screening, Triage and Referral (STR) ? ?Patient Reported Information ?How did you hear about [place]? Self ? ?Referral name: No data recorded ?Referral phone number: No data recorded ? ?Whom do you see for routine medical problems? No data recorded ?Practice/Facility Name: No data recorded ?Practice/Facility Phone Number: No data recorded ?Name of Contact: No data recorded ?Contact Number: No data recorded ?Contact Fax Number: No data recorded ?Prescriber Name: No data recorded ?Prescriber Address (if known): No data recorded ? ?What Is the Reason for Your Visit/Call Today? Patient presents voluntarily for detox treatment ? ?How Long Has This Been Causing You Problems? > than 6 months ? ?What Do You Feel Would Help You the Most Today? Alcohol or Drug Use Treatment ? ? ?Have You Recently Been in Any Inpatient Treatment (Hospital/Detox/Crisis Center/28-Day Program)? No data recorded ?Name/Location of Program/Hospital:No data recorded ?How Long Were You There? No data recorded ?When Were You Discharged? No data recorded ? ?Have You Ever Received Services From [employer] Before? No data recorded ?Who Do You See at [facility]? No data recorded ? ?Have You Recently Had Any Thoughts About Hurting Yourself? No ? ?Are You Planning to Commit Suicide/Harm Yourself At This time? No ? ? ?Have you Recently Had Thoughts About [place]? No ? ?Explanation: No data recorded ? ?Have You Used Any Alcohol or Drugs in the Past 24 Hours? Yes ? ?How Long Ago Did You Use Drugs or Alcohol? No data recorded ?What Did You Use and How Much? Alcohol, "1 quart or a third of vodka" ? ? ?Do You Currently Have a Therapist/Psychiatrist? No ? ?Name of Therapist/Psychiatrist: No data recorded ? ?Have You Been Recently Discharged From Any Office Practice or Programs? No ? ?Explanation of Discharge From Practice/Program: No data recorded ? ?  ?CCA Screening Triage Referral Assessment ?Type of Contact: Face-to-Face ? ?Is this Initial or Reassessment? No data recorded ?Date Telepsych consult ordered in CHL:  No data recorded ?Time Telepsych consult ordered in CHL:  No data recorded ? ?Patient Reported Information Reviewed? No data recorded ?Patient Left Without Being Seen? No data recorded ?Reason for Not Completing Assessment: No data recorded ? ?Collateral Involvement: No data recorded ? ?Does Patient Have a Stage manager Guardian? No data recorded ?Name and Contact of Legal Guardian: No data recorded ?If Minor and Not Living with Parent(s), Who has Custody? No data recorded ?Is CPS involved or ever been involved? Never ? ?Is APS involved or ever been involved? Never ? ? ?Patient Determined To Be At Risk for Harm To Self or Others Based on Review of Patient Reported Information or Presenting Complaint? No ? ?Method: No data recorded ?  Availability of Means: No data recorded ?Intent: No data recorded ?Notification Required: No data recorded ?Additional Information for Danger to Others Potential: No data recorded ?Additional Comments for Danger to Others Potential: No data recorded ?Are There  Guns or Other Weapons in [place]? No data recorded ?Types of Guns/Weapons: No data recorded ?Are These Weapons Safely Secured?                            No data recorded ?Who Could Verify You Are Able To Have These Secured: No data recorded ?Do You Have any Outstanding Charges, Pending Court Dates, Parole/Probation? No data recorded ?Contacted To Inform of Risk of Harm To Self or Others: No data recorded ? ?Location of Assessment: [facility] ? ? ?Does Patient Present under Involuntary Commitment? No ? ?IVC Papers Initial File Date: No data recorded ? ?[place] of Residence: [place] ? ? ?Patient Currently Receiving the Following Services: No data recorded ? ?Determination of Need: Emergent (2 hours) ? ? ?Options For Referral: No data recorded ? ? ? ?CCA Biopsychosocial ?Intake/Chief Complaint:  No data recorded ?Current Symptoms/Problems: No data recorded ? ?Patient Reported Schizophrenia/Schizoaffective Diagnosis in Past: No ? ? ?Strengths: Patient is able to communicate ? ?Preferences: No data recorded ?Abilities: No data recorded ? ?Type of Services Patient Feels are Needed: No data recorded ? ?Initial Clinical Notes/Concerns: No data recorded ? ?Mental Health Symptoms ?Depression:   ?Change in energy/activity; Fatigue ?  ?Duration of Depressive symptoms:  ?Greater than two weeks ?  ?Mania:   ?None ?  ?Anxiety:    ?None ?  ?Psychosis:   ?None ?  ?Duration of Psychotic symptoms: No data recorded  ?Trauma:   ?None ?  ?Obsessions:   ?None ?  ?Compulsions:   ?None ?  ?Inattention:   ?None ?  ?Hyperactivity/Impulsivity:   ?None ?  ?Oppositional/Defiant Behaviors:   ?None ?  ?Emotional Irregularity:   ?None ?  ?Other Mood/Personality Symptoms:  No data recorded  ? ?Mental Status Exam ?Appearance and self-care  ?Stature:   ?Average ?  ?Weight:   ?Average weight ?  ?Clothing:   ?Casual ?  ?Grooming:   ?Normal ?  ?Cosmetic use:   ?None ?  ?Posture/gait:   ?Normal ?  ?Motor activity:   ?Not Remarkable ?  ?Sensorium   ?Attention:   ?Normal ?  ?Concentration:   ?Normal ?  ?Orientation:   ?X5 ?  ?Recall/memory:   ?Normal ?  ?Affect and Mood  ?Affect:   ?Appropriate ?  ?Mood:   ?Other (Comment) ?  ?Relating  ?Eye contact:   ?Normal ?  ?Facial expression:   ?Responsive ?  ?Attitude toward examiner:   ?Cooperative ?  ?Thought and Language  ?Speech flow:  ?Clear and Coherent ?  ?Thought content:   ?Appropriate to Mood and Circumstances ?  ?Preoccupation:   ?None ?  ?Hallucinations:   ?None ?  ?Organization:  No data recorded  ?Executive Functions  ?Fund of Knowledge:   ?Good ?  ?Intelligence:   ?Average ?  ?Abstraction:   ?Functional ?  ?Judgement:   ?Good ?  ?Reality Testing:   ?Realistic ?  ?Insight:   ?Good ?  ?Decision Making:   ?Normal ?  ?Social Functioning  ?Social Maturity:   ?Responsible ?  ?Social Judgement:   ?Normal ?  ?Stress  ?Stressors:   ?Other (Comment) ?  ?Coping Ability:   ?Normal ?  ?Skill Deficits:   ?None ?  ?Supports:   ?  Family ?  ? ? ?Religion: ?Religion/Spirituality ?Are You A Religious Person?: No ? ?Leisure/Recreation: ?Leisure / Recreation ?Do You Have Hobbies?: No ? ?Exercise/Diet: ?Exercise/Diet ?Do You Exercise?: No ?Have You Gained or Lost A Significant Amount of Weight in the Past Six Months?: No ?Do You Follow a Special Diet?: No ?Do You Have Any Trouble Sleeping?: No ? ? ?CCA Employment/Education ?Employment/Work Situation: ?Employment / Work Situation ?Employment Situation: Retired ?Patient's Job has Been Impacted by Current Illness: No ?Has Patient ever Been in the Military?: No ? ?Education: ?Education ?Is Patient Currently Attending School?: No ?Did You Attend College?: No ?Did You Have An Individualized Education Program (IIEP): No ?Did You Have Any Difficulty At School?: No ?Patient's Education Has Been Impacted by Current Illness: No ? ? ?CCA Family/Childhood History ?Family and Relationship History: ?Family history ?Marital status: Single ?Does patient have children?: Yes ?How many children?:  1 ?How is patient's relationship with their children?: Patient has a good relationship with her daugther ? ?Childhood History:  ?Childhood History ?Did patient suffer any verbal/emotional/physical/sexual ab

## 2021-09-14 LAB — HIV ANTIBODY (ROUTINE TESTING W REFLEX): HIV Screen 4th Generation wRfx: NONREACTIVE

## 2021-09-14 LAB — BASIC METABOLIC PANEL
Anion gap: 6 (ref 5–15)
BUN: 6 mg/dL — ABNORMAL LOW (ref 8–23)
CO2: 26 mmol/L (ref 22–32)
Calcium: 8.5 mg/dL — ABNORMAL LOW (ref 8.9–10.3)
Chloride: 107 mmol/L (ref 98–111)
Creatinine, Ser: 0.58 mg/dL (ref 0.44–1.00)
GFR, Estimated: >60 mL/min (ref >60)
Glucose, Bld: 176 mg/dL — ABNORMAL HIGH (ref 70–99)
Potassium: 3.1 mmol/L — ABNORMAL LOW (ref 3.5–5.1)
Sodium: 139 mmol/L (ref 135–145)

## 2021-09-14 LAB — GLUCOSE, CAPILLARY
Glucose-Capillary: 270 mg/dL — ABNORMAL HIGH (ref 70–99)
Glucose-Capillary: 226 mg/dL — ABNORMAL HIGH (ref 70–99)
Glucose-Capillary: 275 mg/dL — ABNORMAL HIGH (ref 70–99)
Glucose-Capillary: 126 mg/dL — ABNORMAL HIGH (ref 70–99)

## 2021-09-14 LAB — PHOSPHORUS: Phosphorus: 2.1 mg/dL — ABNORMAL LOW (ref 2.5–4.6)

## 2021-09-14 MED ORDER — INSULIN GLARGINE-YFGN 100 UNIT/ML ~~LOC~~ SOLN
25.0000 [IU] | Freq: Every day | SUBCUTANEOUS | Status: DC
  Administered 2021-09-15 – 2021-09-16 (×2): 25 [IU] via SUBCUTANEOUS
  Filled 2021-09-14 (×2): qty 0.25

## 2021-09-14 MED ORDER — POTASSIUM CHLORIDE CRYS ER 20 MEQ PO TBCR
40.0000 meq | EXTENDED_RELEASE_TABLET | Freq: Every day | ORAL | Status: DC
  Administered 2021-09-14 – 2021-09-16 (×3): 40 meq via ORAL
  Filled 2021-09-14 (×3): qty 2

## 2021-09-14 MED ORDER — INSULIN ASPART 100 UNIT/ML IJ SOLN
3.0000 [IU] | Freq: Three times a day (TID) | INTRAMUSCULAR | Status: DC
  Administered 2021-09-14 – 2021-09-16 (×6): 3 [IU] via SUBCUTANEOUS
  Filled 2021-09-14 (×6): qty 1

## 2021-09-14 NOTE — TOC Initial Note (Signed)
Transition of Care (TOC) - Initial/Assessment Note  ? ? ?Patient Details  ?Name: Sharon Melton ?MRN: [id number] ?Date of Birth: [birth year] ? ?Transition of Care (TOC) CM/SW Contact:    ?Beverly Sessions, RN ?Phone Number: ?09/14/2021, 2:48 PM ? ?Clinical Narrative:                 ? ?Admitted for: Alcohol detox ?Admitted from: home with husband ?WUJ:[id number] ?Pharmacy:[facility] ?Current home health/prior home health/DME:NA ? ?Patient states that she has been paying out of pocket for PCP, but is no longer able to do so.  Patient agreeable appointment at [facility].  Scheduled at 11/03/21 added to avs ? ?Provided application for [facility]ication Management and [facility] .  ? ?Pharmacy updated in system as [facility]  ? ?Provided patient charity glucometer kit  ? ?Patient agreeable to substance abuse resources for alcohol use.  Provided at bedside  ? ? ?Expected Discharge Plan: Home/Self Care ?Barriers to Discharge: Continued Medical Work up ? ? ?Patient Goals and CMS Choice ?  ?  ?  ? ?Expected Discharge Plan and Services ?Expected Discharge Plan: Home/Self Care ?  ?  ?  ?  ?                ?  ?  ?  ?  ?  ?  ?  ?  ?  ?  ? ?Prior Living Arrangements/Services ?  ?  ?  ?       ?  ?  ?  ?  ? ?Activities of Daily Living ?Home Assistive Devices/Equipment: None ?ADL Screening (condition at time of admission) ?Patient's cognitive ability adequate to safely complete daily activities?: Yes ?Is the patient deaf or have difficulty hearing?: No ?Does the patient have difficulty seeing, even when wearing glasses/contacts?: No ?Does the patient have difficulty concentrating, remembering, or making decisions?: No ?Patient able to express need for assistance with ADLs?: Yes ?Does the patient have difficulty dressing or bathing?: No ?Independently performs ADLs?: Yes (appropriate for developmental age) ?Does the patient have difficulty walking or climbing stairs?: No ?Weakness of Legs: None ?Weakness of Arms/Hands:  None ? ?Permission Sought/Granted ?  ?  ?   ?   ?   ?   ? ?Emotional Assessment ?  ?  ?  ?  ?  ?  ? ?Admission diagnosis:  Hypokalemia [E87.6] ?Alcohol abuse [F10.10] ?Prolonged Q-T interval on ECG [R94.31] ?Hyperglycemia [R73.9] ?Patient Active Problem List  ? Diagnosis Date Noted  ? Hypokalemia 09/13/2021  ? HTN (hypertension) 09/13/2021  ? HLD (hyperlipidemia) 09/13/2021  ? Alcohol withdrawal ([place]) 09/13/2021  ? Diabetes mellitus without complication ([place]) 47/82/9562  ? Abnormal LFTs 09/13/2021  ? Hepatitis 04/25/2021  ? Elevated liver enzymes 12/22/2017  ? Vitamin D deficiency 12/22/2017  ? Hyperlipidemia associated with type 2 diabetes mellitus ([place]) 04/11/2017  ? History of tobacco abuse 01/01/2014  ? Atherosclerosis of native artery of extremity with intermittent claudication ([place]) 09/04/2013  ? Encounter for preventive measure 11/24/2012  ? Screening for colon cancer 12/01/2011  ? Obesity 12/01/2011  ? Generalized anxiety disorder 08/01/2011  ? Essential hypertension 08/01/2011  ? Hyperlipidemia with target LDL less than 70 08/01/2011  ? Coronary artery disease 08/01/2011  ? ?PCP:  Crecencio Mc, MD ?Pharmacy:   ?[facility] [place], [place] AT [facility] [address] ?[place] ?[place] [address] ?Phone: [phone] Fax: [phone] ? ?[place] [phone] -  [place] [address] [place] - [address] ?[place] ?[place] ([place]) [place] [address] ?Phone: [phone] Fax: [phone] ? ? ? ? ?Social Determinants of Health (SDOH) Interventions ?  ? ?Readmission Risk Interventions ?   ? View : No data to display.  ?  ?  ?  ? ? ? ?

## 2021-09-14 NOTE — Plan of Care (Signed)

## 2021-09-14 NOTE — Progress Notes (Signed)
Inpatient Diabetes Program Recommendations ? ?AACE/ADA: New Consensus Statement on Inpatient Glycemic Control  ? ?Target Ranges:  Prepandial:   less than 140 mg/dL ?     Peak postprandial:   less than 180 mg/dL (1-2 hours) ?     Critically ill patients:  140 - 180 mg/dL  ? ? Latest Reference Range & Units 09/14/21 08:58 09/14/21 12:03  ?Glucose-Capillary 70 - 99 mg/dL 875 (H) 797 (H)  ? ? Latest Reference Range & Units 09/13/21 08:02 09/13/21 10:05 09/13/21 11:36 09/13/21 17:57 09/13/21 23:00  ?Glucose-Capillary 70 - 99 mg/dL 282 (H) 060 (H) 156 (H) 224 (H) 239 (H)  ? ?Review of Glycemic Control ? ?Diabetes history: DM2 ?Outpatient Diabetes medications: Tresiba 20 units daily, Metformin 500 mg BID (not taken since January b/c not able to afford) ?Current orders for Inpatient glycemic control: Semglee 20 units daily, Novolog 0-15 units TID with meals, Novolog 0-5 units QHS ? ?Inpatient Diabetes Program Recommendations:   ? ?Insulin: Please consider increasing Semglee to 25 units daily and adding Novolog 3 units TID with meals for meal coverage if patient eats at least 50% of meals. ? ?Thanks, ?Orlando Penner, RN, MSN, CDE ?Diabetes Coordinator ?Inpatient Diabetes Program ?[phone] (Team Pager from 8am to 5pm) ? ? ?

## 2021-09-14 NOTE — Progress Notes (Signed)
?PROGRESS NOTE ? ? ? ?Sharon Melton  ZOX:[id number] DOB: 10/16/1960 DOA: 09/12/2021 ?PCP: Sherlene Shamsullo, Sharon Melton, Sharon Melton  ? ? ?Brief Narrative:  ? 61 y.o. female with medical history significant of alcohol abuse, HLD, HTN, DM, CAD, who presents with alcohol withdraw. ?  ?Patient states that she has been heavily drinking off/on since her late 20's. Patient states she usually drinks 1/3 of a large bottle of Vodka daily. Her last drinking was yesterday afternoon.  She states that she currently feels tremulous, anxious, with mild dizziness, flushing and heart racing. No history of seizures.  She states that she wants alcohol detox treatment.  Patient denies chest pain, cough, shortness breath.  No fever or chills.  She has nausea, no vomiting, diarrhea or abdominal pain.  No symptoms of UTI.  ? ? ?Assessment & Plan: ?  ?Principal Problem: ?  Hypokalemia ?Active Problems: ?  Alcohol withdrawal (HCC) ?  Coronary artery disease ?  HTN (hypertension) ?  HLD (hyperlipidemia) ?  Diabetes mellitus without complication (HCC) ?  Abnormal LFTs ? ?* Hypokalemia ?Electrolyte deficiencies likely secondary to alcohol use and poor p.o. intake ?Plan: ?Daily BMP and magnesium ?Monitor potassium and magnesium and replace as necessary ?Continue telemetry monitoring for least the next 24 hours.  Consider discontinuation on 4/26 if patient has been stable ?  ?Alcohol withdrawal (HCC) ?Heavy alcohol use ?Has been drinking vodka daily ?Plan: ?Continue Librium 10 mg 3 times daily ?CIWA protocol with as needed Ativan ?Counseled on cessation ?Patient interested in detox including ?TOC consult for substance abuse resources  ?  ?Abnormal LFTs ?Patient has mild abnormal liver function.  Most likely due to alcohol abuse. ?-Avoid using Tylenol ?  ?Diabetes mellitus without complication (HCC) ?Recent A1c 11.4, poorly controlled.  Patient taking metformin and glargine insulin 20 units daily at home.  Initial blood sugar was 567, bicarbonate 23 with AG 17, which  improved to CBG of 258 and AG of 8 after giving IV fluid in ED. ?Plan: ?Semglee 25 units daily ?NovoLog 3 units 3 times daily with meals ?Goal blood sugar while hospitalized 140-180 ?Diabetes coordinator consult ?Carb modified diet ?  ?HLD (hyperlipidemia) ?- Lipitor ?  ?HTN (hypertension) ?- IV hydralazine as needed ?-Hold HCTZ ?-Amlodipine, Coreg, Cozaar ?  ?Coronary artery disease ?No chest pain ?-Continue Plavix and Lipitor ? ?DVT prophylaxis: SQ Lovenox ?Code Status: Full ?Family Communication: None today ?Disposition Plan: Status is: Observation ?The patient will require care spanning > 2 midnights and should be moved to inpatient because: Acute alcohol withdrawal on CIWA protocol ? ? ?Level of care: Telemetry Medical ? ?Consultants:  ?None ? ?Procedures:  ?None ? ?Antimicrobials: ?None ? ? ?Subjective: ?Patient resting in bed.  No visible distress.  Does endorse headache and mild tremulousness ? ?Objective: ?Vitals:  ? 09/13/21 1755 09/13/21 1959 09/14/21 0426 09/14/21 0900  ?BP: (!) 153/90 (!) 175/97 (!) 154/86 (!) 167/75  ?Pulse: 71 75 66   ?Resp: (!) 22 20 20    ?Temp: 99.1 ?F (37.3 ?C) 98.2 ?F (36.8 ?C) 97.9 ?F (36.6 ?C)   ?TempSrc:   Oral   ?SpO2: 96% 93% 97%   ?Weight:      ?Height:      ? ? ?Intake/Output Summary (Last 24 hours) at 09/14/2021 1342 ?Last data filed at 09/14/2021 1139 ?Gross per 24 hour  ?Intake 1768.78 ml  ?Output --  ?Net 1768.78 ml  ? ?Filed Weights  ? 09/12/21 2010  ?Weight: 72.1 kg  ? ? ?Examination: ? ?General exam: Mildly tremulous.  Appears fatigued ?Respiratory system: Lungs clear.  Normal work of breathing.  Room air ?Cardiovascular system: S1-S2, RRR, no murmurs, no pedal edema ?Gastrointestinal system: Soft, NT/ND, normal bowel sounds ?Central nervous system: Alert and oriented. No focal neurological deficits. ?Extremities: Symmetric 5 x 5 power. ?Skin: No rashes, lesions or ulcers ?Psychiatry: Judgement and insight appear normal. Mood & affect appropriate.  ? ? ? ?Data  Reviewed: I have personally reviewed following labs and imaging studies ? ?CBC: ?Recent Labs  ?Lab 09/12/21 ?2015  ?WBC 6.9  ?HGB 13.1  ?HCT 39.9  ?MCV 77.6*  ?PLT 225  ? ?Basic Metabolic Panel: ?Recent Labs  ?Lab 09/12/21 ?2015 09/12/21 ?2145 09/13/21 ?0200 09/13/21 ?1500 09/14/21 ?0335  ?NA 129*  --  135 136 139  ?K 2.4*  --  2.5* 3.1* 3.1*  ?CL 89*  --  101 104 107  ?CO2 23  --  26 24 26   ?GLUCOSE 567*  --  285* 186* 176*  ?BUN 12  --  9 8 6*  ?CREATININE 0.76  --  0.66 0.68 0.58  ?CALCIUM 10.3  --  8.7* 8.6* 8.5*  ?MG  --  1.7  --   --   --   ?PHOS  --   --   --  <1.0* 2.1*  ? ?GFR: ?Estimated Creatinine Clearance: 72.5 mL/min (by C-G formula based on SCr of 0.58 mg/dL). ?Liver Function Tests: ?Recent Labs  ?Lab 09/12/21 ?2015  ?AST 130*  ?ALT 49*  ?ALKPHOS 151*  ?BILITOT 0.8  ?PROT 7.3  ?ALBUMIN 3.8  ? ?No results for input(s): LIPASE, AMYLASE in the last 168 hours. ?No results for input(s): AMMONIA in the last 168 hours. ?Coagulation Profile: ?No results for input(s): INR, PROTIME in the last 168 hours. ?Cardiac Enzymes: ?No results for input(s): CKTOTAL, CKMB, CKMBINDEX, TROPONINI in the last 168 hours. ?BNP (last 3 results) ?No results for input(s): PROBNP in the last 8760 hours. ?HbA1C: ?No results for input(s): HGBA1C in the last 72 hours. ?CBG: ?Recent Labs  ?Lab 09/13/21 ?1136 09/13/21 ?1757 09/13/21 ?2300 09/14/21 ?0858 09/14/21 ?1203  ?GLUCAP 302* 224* 239* 270* 226*  ? ?Lipid Profile: ?No results for input(s): CHOL, HDL, LDLCALC, TRIG, CHOLHDL, LDLDIRECT in the last 72 hours. ?Thyroid Function Tests: ?No results for input(s): TSH, T4TOTAL, FREET4, T3FREE, THYROIDAB in the last 72 hours. ?Anemia Panel: ?No results for input(s): VITAMINB12, FOLATE, FERRITIN, TIBC, IRON, RETICCTPCT in the last 72 hours. ?Sepsis Labs: ?No results for input(s): PROCALCITON, LATICACIDVEN in the last 168 hours. ? ?Recent Results (from the past 240 hour(s))  ?Resp Panel by RT-PCR (Flu A&B, Covid) Nasopharyngeal Swab      Status: None  ? Collection Time: 09/12/21  9:38 PM  ? Specimen: Nasopharyngeal Swab; Nasopharyngeal(NP) swabs in vial transport medium  ?Result Value Ref Range Status  ? SARS Coronavirus 2 by RT PCR NEGATIVE NEGATIVE Final  ?  Comment: (NOTE) ?SARS-CoV-2 target nucleic acids are NOT DETECTED. ? ?The SARS-CoV-2 RNA is generally detectable in upper respiratory ?specimens during the acute phase of infection. The lowest ?concentration of SARS-CoV-2 viral copies this assay can detect is ?138 copies/mL. A negative result does not preclude SARS-Cov-2 ?infection and should not be used as the sole basis for treatment or ?other patient management decisions. A negative result may occur with  ?improper specimen collection/handling, submission of specimen other ?than nasopharyngeal swab, presence of viral mutation(s) within the ?areas targeted by this assay, and inadequate number of viral ?copies(<138 copies/mL). A negative result must be combined with ?clinical observations,  patient history, and epidemiological ?information. The expected result is Negative. ? ?Fact Sheet for Patients:  ?BloggerCourse.com ? ?Fact Sheet for Healthcare Providers:  ?SeriousBroker.it ? ?This test is no t yet approved or cleared by the [place] FDA and  ?has been authorized for detection and/or diagnosis of SARS-CoV-2 by ?FDA under an Emergency Use Authorization (EUA). This EUA will remain  ?in effect (meaning this test can be used) for the duration of the ?COVID-19 declaration under Section 564(b)(1) of the Act, 21 ?U.S.C.section 360bbb-3(b)(1), unless the authorization is terminated  ?or revoked sooner.  ? ? ?  ? Influenza A by PCR NEGATIVE NEGATIVE Final  ? Influenza B by PCR NEGATIVE NEGATIVE Final  ?  Comment: (NOTE) ?The Xpert Xpress SARS-CoV-2/FLU/RSV plus assay is intended as an aid ?in the diagnosis of influenza from Nasopharyngeal swab specimens and ?should not be used as a sole basis  for treatment. Nasal washings and ?aspirates are unacceptable for Xpert Xpress SARS-CoV-2/FLU/RSV ?testing. ? ?Fact Sheet for Patients: ?BloggerCourse.com ? ?Fact Sheet for Healthcare Provide

## 2021-09-15 DIAGNOSIS — F10930 Alcohol use, unspecified with withdrawal, uncomplicated: Secondary | ICD-10-CM

## 2021-09-15 DIAGNOSIS — E1169 Type 2 diabetes mellitus with other specified complication: Secondary | ICD-10-CM

## 2021-09-15 LAB — GLUCOSE, CAPILLARY
Glucose-Capillary: 141 mg/dL — ABNORMAL HIGH (ref 70–99)
Glucose-Capillary: 226 mg/dL — ABNORMAL HIGH (ref 70–99)
Glucose-Capillary: 149 mg/dL — ABNORMAL HIGH (ref 70–99)
Glucose-Capillary: 119 mg/dL — ABNORMAL HIGH (ref 70–99)

## 2021-09-15 LAB — POTASSIUM: Potassium: 3.2 mmol/L — ABNORMAL LOW (ref 3.5–5.1)

## 2021-09-15 LAB — MAGNESIUM: Magnesium: 1.6 mg/dL — ABNORMAL LOW (ref 1.7–2.4)

## 2021-09-15 LAB — PHOSPHORUS: Phosphorus: 2.1 mg/dL — ABNORMAL LOW (ref 2.5–4.6)

## 2021-09-15 MED ORDER — MAGNESIUM SULFATE 2 GM/50ML IV SOLN
2.0000 g | Freq: Once | INTRAVENOUS | Status: AC
  Administered 2021-09-15: 2 g via INTRAVENOUS
  Filled 2021-09-15: qty 50

## 2021-09-15 MED ORDER — K PHOS MONO-SOD PHOS DI & MONO 155-852-130 MG PO TABS
500.0000 mg | ORAL_TABLET | Freq: Three times a day (TID) | ORAL | Status: AC
  Administered 2021-09-15 (×3): 500 mg via ORAL
  Filled 2021-09-15 (×3): qty 2

## 2021-09-15 NOTE — Assessment & Plan Note (Addendum)
Phosphorus up to 2.6.  2 doses of Neutra-Phos upon going home ?

## 2021-09-15 NOTE — Assessment & Plan Note (Addendum)
Continue Lipitor.  Prescribed Basaglar insulin through medication management with short acting insulin prior to meals.  Plans for sugars 149, 119, 148 and 222 ?

## 2021-09-15 NOTE — Progress Notes (Signed)
Mobility Specialist - Progress Note ? ? ? 09/15/21 1500  ?Mobility  ?Activity Ambulated independently in hallway;Stood at bedside;Dangled on edge of bed  ?Level of Assistance Contact guard assist, steadying assist  ?Assistive Device None  ?Distance Ambulated (ft) 320 ft  ?Activity Response Tolerated well  ?$Mobility charge 1 Mobility  ? ? ?Pre-mobility: 77 HR, 98% SpO2 ?During mobility: 88 HR, 96% SpO2 ? ?Pt supine upon arrival using RA. Pt completes bed mobility to sit EOB indep and STS with SUPERVISION. Pt ambulates 160ft CGA due to 1 self corrected LOB and continues ambulation with SUPERVISION. Returns to bed with needs in reach. ? ?Clarisa Schools ?Mobility Specialist ?09/15/21, 3:03 PM ? ? ? ? ?

## 2021-09-15 NOTE — Chronic Care Management (AMB) (Signed)
?  Care Management  ? ?Note ? ?09/15/2021 ?Name: Sharon Melton MRN: [id number] DOB: [birth year] ? ?Sharon Melton is a 61 y.o. year old female who is a primary care patient of Darrick Huntsman, Mar Daring, MD and is actively engaged with the care management team. I reached out to Greggory Brandy by phone today to assist with re-scheduling an initial visit with the Licensed Clinical Social Worker ? ?Follow up plan: ?Unable to make contact on outreach attempts x 3. PCP Sherlene Shams, MD notified via routed documentation in medical record.  ? ?Penne Lash, RMA ?Care Guide, Embedded Care Coordination ?  Care Management  ?[place], [place] [address] ?Direct Dial: [phone] ?[email or web address] ?Website: [email or web address]  ? ?

## 2021-09-15 NOTE — Progress Notes (Signed)
?  Progress Note ? ? ?Patient: Sharon Melton [id number] DOB: Oct 25, 1960 DOA: 09/12/2021     0 ?DOS: the patient was seen and examined on 09/15/2021 ?  ? ?Assessment and Plan: ?* Hypomagnesemia ?Replace with IV magnesium today.  Will need oral magnesium upon discharge. ? ?Hypokalemia ?Potassium 3.1 today will replace with Neutra-Phos and also oral potassium. ? ?Hypophosphatemia ?Replace with Neutra-Phos today.  Recheck phosphorus tomorrow. ? ?Alcohol withdrawal ([place]) ?Patient interested in quitting alcohol.  Librium taper. ? ? ?Abnormal LFTs ?Patient has mild abnormal liver function.  Most likely due to alcohol abuse. ?-Avoid using Tylenol ? ? ?HTN (hypertension) ?Hold HCTZ with electrolyte abnormalities ?Continue amlodipine, Coreg, Cozaar ? ?Type 2 diabetes mellitus with hyperlipidemia ([place]) ?Continue Lipitor.  Continue Semglee insulin.  Continue short acting insulin plus sliding scale.  Last four sugars 275, 126, 141 and 226. ? ?Coronary artery disease ?Continue Plavix and Lipitor ? ? ? ? ?  ? ?Subjective: Patient feeling a little weak and a little unsteady with walking.  Eating a little bit better.  States she is going to quit alcohol.  Initially admitted with electrolyte abnormalities and alcohol withdrawal. ? ?Physical Exam: ?Vitals:  ? 09/14/21 2055 09/15/21 0001 09/15/21 0435 09/15/21 0721  ?BP: 140/84 138/81 (!) 152/78 (!) 148/85  ?Pulse: 70 76 61 68  ?Resp: 18 18 18 18   ?Temp: 98.9 ?F (37.2 ?C)  97.8 ?F (36.6 ?C) 98.1 ?F (36.7 ?C)  ?TempSrc: Oral  Oral Oral  ?SpO2: 90% 90% 94% 97%  ?Weight:      ?Height:      ? ?Physical Exam ?HENT:  ?   Head: Normocephalic.  ?   Mouth/Throat:  ?   Pharynx: No oropharyngeal exudate.  ?Eyes:  ?   General: Lids are normal.  ?   Conjunctiva/sclera: Conjunctivae normal.  ?Cardiovascular:  ?   Rate and Rhythm: Normal rate and regular rhythm.  ?   Heart sounds: Normal heart sounds, S1 normal and S2 normal.  ?Pulmonary:  ?   Breath sounds: No decreased breath sounds, wheezing,  rhonchi or rales.  ?Abdominal:  ?   Palpations: Abdomen is soft.  ?   Tenderness: There is no abdominal tenderness.  ?Musculoskeletal:  ?   Right lower leg: No swelling.  ?   Left lower leg: No swelling.  ?Skin: ?   General: Skin is warm.  ?   Findings: No rash.  ?Neurological:  ?   Mental Status: She is alert and oriented to person, place, and time.  ?  ?Data Reviewed: ?Magnesium 1.6, phosphorus 2.1 and potassium 3.2 ? ?Family Communication: Left message for husband on the phone. ? ?Disposition: ?Status is: Inpatient ?Remains inpatient appropriate because: Treating electrolyte abnormalities today ? ?Planned Discharge Destination: Home ? ?Author: ?Loletha Grayer, MD ?09/15/2021 3:25 PM ? ?For on call review [email or web address].  ?

## 2021-09-15 NOTE — Assessment & Plan Note (Addendum)
Replaced up to the level of 1.8.  We will continue oral magnesium upon discharge ?

## 2021-09-16 ENCOUNTER — Other Ambulatory Visit: Payer: Self-pay

## 2021-09-16 DIAGNOSIS — E871 Hypo-osmolality and hyponatremia: Secondary | ICD-10-CM

## 2021-09-16 LAB — BASIC METABOLIC PANEL
Anion gap: 8 (ref 5–15)
BUN: 8 mg/dL (ref 8–23)
CO2: 26 mmol/L (ref 22–32)
Calcium: 9 mg/dL (ref 8.9–10.3)
Chloride: 103 mmol/L (ref 98–111)
Creatinine, Ser: 0.58 mg/dL (ref 0.44–1.00)
GFR, Estimated: >60 mL/min (ref >60)
Glucose, Bld: 220 mg/dL — ABNORMAL HIGH (ref 70–99)
Potassium: 3.6 mmol/L (ref 3.5–5.1)
Sodium: 137 mmol/L (ref 135–145)

## 2021-09-16 LAB — GLUCOSE, CAPILLARY
Glucose-Capillary: 148 mg/dL — ABNORMAL HIGH (ref 70–99)
Glucose-Capillary: 222 mg/dL — ABNORMAL HIGH (ref 70–99)

## 2021-09-16 LAB — PHOSPHORUS: Phosphorus: 2.6 mg/dL (ref 2.5–4.6)

## 2021-09-16 LAB — MAGNESIUM: Magnesium: 1.8 mg/dL (ref 1.7–2.4)

## 2021-09-16 MED ORDER — LOSARTAN POTASSIUM 100 MG PO TABS
100.0000 mg | ORAL_TABLET | Freq: Every day | ORAL | 1 refills | Status: DC
  Filled 2021-09-16: qty 30, 30d supply, fill #0

## 2021-09-16 MED ORDER — VITAMIN B-1 100 MG PO TABS
100.0000 mg | ORAL_TABLET | Freq: Every day | ORAL | 1 refills | Status: DC
  Filled 2021-09-16: qty 30, 30d supply, fill #0

## 2021-09-16 MED ORDER — MAGNESIUM OXIDE 400 MG PO CAPS
400.0000 mg | ORAL_CAPSULE | Freq: Every day | ORAL | 1 refills | Status: AC
Start: 2021-09-16 — End: ?
  Filled 2021-09-16: qty 30, 30d supply, fill #0

## 2021-09-16 MED ORDER — ONE-DAILY MULTI VITAMINS PO TABS
1.0000 | ORAL_TABLET | Freq: Every day | ORAL | 1 refills | Status: AC
  Filled 2021-09-16: qty 30, 30d supply, fill #0

## 2021-09-16 MED ORDER — K PHOS MONO-SOD PHOS DI & MONO 155-852-130 MG PO TABS: 500.0000 mg | ORAL_TABLET | Freq: Two times a day (BID) | ORAL | 0 refills | Status: DC

## 2021-09-16 MED ORDER — PAROXETINE HCL 30 MG PO TABS
30.0000 mg | ORAL_TABLET | Freq: Every day | ORAL | 1 refills | Status: DC
  Filled 2021-09-16: qty 30, 30d supply, fill #0

## 2021-09-16 MED ORDER — CARVEDILOL 3.125 MG PO TABS
3.1250 mg | ORAL_TABLET | Freq: Two times a day (BID) | ORAL | 1 refills | Status: DC
Start: 2021-09-16 — End: 2021-11-04
  Filled 2021-09-16: qty 60, 30d supply, fill #0

## 2021-09-16 MED ORDER — CHLORDIAZEPOXIDE HCL 10 MG PO CAPS: ORAL_CAPSULE | ORAL | 0 refills | Status: DC

## 2021-09-16 MED ORDER — CLOPIDOGREL BISULFATE 75 MG PO TABS
75.0000 mg | ORAL_TABLET | Freq: Every day | ORAL | 1 refills | Status: DC
Start: 2021-09-16 — End: 2021-11-04
  Filled 2021-09-16: qty 30, 30d supply, fill #0

## 2021-09-16 MED ORDER — TRAZODONE HCL 50 MG PO TABS
25.0000 mg | ORAL_TABLET | Freq: Every evening | ORAL | 1 refills | Status: DC | PRN
  Filled 2021-09-16: qty 15, 30d supply, fill #0

## 2021-09-16 MED ORDER — NOVOLOG FLEXPEN 100 UNIT/ML ~~LOC~~ SOPN
5.0000 [IU] | PEN_INJECTOR | Freq: Three times a day (TID) | SUBCUTANEOUS | 0 refills | Status: DC
Start: 2021-09-16 — End: 2021-11-04
  Filled 2021-09-16: qty 15, 100d supply, fill #0

## 2021-09-16 MED ORDER — BASAGLAR KWIKPEN 100 UNIT/ML ~~LOC~~ SOPN
25.0000 [IU] | PEN_INJECTOR | Freq: Every day | SUBCUTANEOUS | 0 refills | Status: DC
  Filled 2021-09-16: qty 15, 60d supply, fill #0

## 2021-09-16 MED ORDER — INSULIN GLARGINE 100 UNIT/ML SOLOSTAR PEN
25.0000 [IU] | PEN_INJECTOR | Freq: Every day | SUBCUTANEOUS | 0 refills | Status: DC
  Filled 2021-09-16: qty 15, 60d supply, fill #0

## 2021-09-16 MED ORDER — K PHOS MONO-SOD PHOS DI & MONO 155-852-130 MG PO TABS
500.0000 mg | ORAL_TABLET | Freq: Two times a day (BID) | ORAL | Status: DC
Start: 2021-09-16 — End: 2021-09-16
  Administered 2021-09-16: 500 mg via ORAL
  Filled 2021-09-16: qty 4
  Filled 2021-09-16: qty 2

## 2021-09-16 MED ORDER — INSULIN PEN NEEDLE 32G X 4 MM MISC
0 refills | Status: DC
Start: 2021-09-16 — End: 2022-12-09
  Filled 2021-09-16: qty 100, 25d supply, fill #0

## 2021-09-16 MED ORDER — AMLODIPINE BESYLATE 5 MG PO TABS
5.0000 mg | ORAL_TABLET | Freq: Every day | ORAL | 1 refills | Status: DC
  Filled 2021-09-16: qty 30, 30d supply, fill #0

## 2021-09-16 MED ORDER — ATORVASTATIN CALCIUM 20 MG PO TABS
20.0000 mg | ORAL_TABLET | Freq: Every day | ORAL | 1 refills | Status: DC
  Filled 2021-09-16: qty 30, 30d supply, fill #0

## 2021-09-16 NOTE — Progress Notes (Signed)
Mobility Specialist - Progress Note ? ? ? 09/16/21 1200  ?Mobility  ?Activity Ambulated independently in hallway;Stood at bedside;Dangled on edge of bed  ?Level of Assistance Standby assist, set-up cues, supervision of patient - no hands on  ?Assistive Device None  ?Distance Ambulated (ft) 500 ft  ?Activity Response Tolerated well  ?$Mobility charge 1 Mobility  ? ? ? ?Pt supine upon arrival using RA. Pt completes bed mobility and STS indep and ambulates with supervision. Pt returns to bed with needs in reach. ? ?Clarisa Schools ?Mobility Specialist ?09/16/21, 12:22 PM ? ? ? ?

## 2021-09-16 NOTE — Discharge Summary (Signed)
?Physician Discharge Summary ?  ?Patient: Sharon Melton MRN: [id number] DOB: [birth year]  ?Admit date:     09/12/2021  ?Discharge date: 09/16/21  ?Discharge Physician: Loletha Grayer  ? ?PCP: Ranae Plumber, [place] ? ?Recommendations at discharge:  ? ?Follow-up with new PCP as scheduled ? ?Discharge Diagnoses: ?Principal Problem: ?  Hypomagnesemia ?Active Problems: ?  Hypokalemia ?  Hypophosphatemia ?  Alcohol withdrawal ([place]) ?  Coronary artery disease ?  Type 2 diabetes mellitus with hyperlipidemia ([place]) ?  HTN (hypertension) ?  HLD (hyperlipidemia) ?  Diabetes mellitus without complication ([place]) ?  Abnormal LFTs ?  Hyponatremia ? ?Hospital Course: ?The patient was admitted to the hospital on 09/13/2021 and discharged on 09/16/2021.  She came in with alcohol withdrawal and wanted alcohol detox.  She also had electrolyte abnormalities including hypokalemia hypomagnesemia and hypophosphatemia.  She was treated for alcohol withdrawal with Librium taper. ? ?Assessment and Plan: ?* Hypomagnesemia ?Replaced up to the level of 1.8.  We will continue oral magnesium upon discharge ? ?Hypokalemia ?Potassium replaced up to 3.6.  Patient was given 2 doses of Neutra-Phos upon discharge home ? ?Hypophosphatemia ?Phosphorus up to 2.6.  2 doses of Neutra-Phos upon going home ? ?Alcohol withdrawal ([place]) ?Patient interested in quitting alcohol.  Librium taper prescribed upon discharge ? ? ?Hyponatremia ?Sodium 129 on presentation and came up to 137 upon discharge.  Likely secondary to alcohol. ? ?Abnormal LFTs ?Patient has mild abnormal liver function.  Most likely due to alcohol abuse. ?-Avoid using Tylenol ? ? ?HTN (hypertension) ?Hold HCTZ with electrolyte abnormalities ?Continue amlodipine, Coreg, Cozaar ? ?Type 2 diabetes mellitus with hyperlipidemia ([place]) ?Continue Lipitor.  Prescribed Basaglar insulin through medication management with short acting insulin prior to meals.  Plans for sugars 149, 119, 148 and 222 ? ?Coronary artery  disease ?Continue Plavix and Lipitor ? ? ? ? ?  ? ? ?Consultants: None ?Procedures performed: None ?Disposition: Home ?Diet recommendation:  ?Cardiac and Carb modified diet ?DISCHARGE MEDICATION: ?Allergies as of 09/16/2021   ?No Known Allergies ?  ? ?  ?Medication List  ?  ? ?STOP taking these medications   ? ?ALKA-SELTZER PLS ALLERGY & CGH PO ?  ?losartan-hydrochlorothiazide 100-25 MG tablet ?Commonly known as: HYZAAR ?  ?metFORMIN 500 MG 24 hr tablet ?Commonly known as: GLUCOPHAGE-XR ?  ?Tyler Aas FlexTouch 100 UNIT/ML FlexTouch Pen ?Generic drug: insulin degludec ?  ? ?  ? ?TAKE these medications   ? ?amLODipine 5 MG tablet ?Commonly known as: NORVASC ?Take 1 tablet (5 mg total) by mouth once daily. ?What changed:  ?medication strength ?how much to take ?  ?atorvastatin 20 MG tablet ?Commonly known as: LIPITOR ?Take 1 tablet (20 mg total) by mouth once daily. ?  ?Basaglar KwikPen 100 UNIT/ML ?Inject 25 Units into the skin once daily. ?  ?carvedilol 3.125 MG tablet ?Commonly known as: COREG ?Take 1 tablet (3.125 mg total) by mouth 2 (two) times daily with a meal. ?  ?chlordiazePOXIDE 10 MG capsule ?Commonly known as: LIBRIUM ?One tab po twice a day for two days then one tab po daily for three days then stop ?  ?clopidogrel 75 MG tablet ?Commonly known as: PLAVIX ?Take 1 tablet (75 mg total) by mouth once daily. ?  ?Comfort EZ Pen Needles 32G X 4 MM Misc ?Generic drug: Insulin Pen Needle ?USE AS DIRECTED FOR INSULIN. ?What changed: additional instructions ?  ?losartan 100 MG tablet ?Commonly known as: COZAAR ?Take 1 tablet (100 mg total) by mouth once daily. ?Start taking  on: September 17, 2021 ?  ?Magnesium Oxide 400 MG Caps ?Take 1 capsule (400 mg total) by mouth daily. ?  ?multivitamin tablet ?Take 1 tablet by mouth daily. ?  ?NovoLOG FlexPen 100 UNIT/ML FlexPen ?Generic drug: insulin aspart ?Inject 5 Units into the skin 3 (three) times daily with meals. ?  ?PARoxetine 30 MG tablet ?Commonly known as: PAXIL ?Take 1  tablet (30 mg total) by mouth once daily. ?  ?phosphorus 155-852-130 MG tablet ?Commonly known as: K PHOS NEUTRAL ?Take 2 tablets (500 mg total) by mouth 2 (two) times daily. ?  ?Rightest GL300 Lancets Misc ?Use as directed to check blood sugar up to four times daily. ?  ?Rightest GM550 Blood Glucose w/Device Kit ?Use as directed to check blood sugar up to four times daily. ?  ?Rightest GS550 Blood Glucose test strip ?Generic drug: glucose blood ?Use as directed to check blood sugar up to four times daily. ?  ?thiamine 100 MG tablet ?Commonly known as: VITAMIN B-1 ?Take 1 tablet (100 mg total) by mouth once daily. ?Start taking on: September 17, 2021 ?  ?traZODone 50 MG tablet ?Commonly known as: DESYREL ?Take 1/2 tablet (25 mg total) by mouth once nightly at bedtime as needed for sleep. ?What changed: how much to take ?  ? ?  ? ? Follow-up Information   ? ? Ranae Plumber, [place]. Go on 11/03/2021.   ?Specialty: Family Medicine ?Why: Arrive at 2 pm ?Call the day before to confirm appointment ?Bring ID, proof of income and proof of address ?Contact information: ?[place] [address] ?[phone] ? ? ?  ?  ? ?  ?  ? ?  ? ?Discharge Exam: ?Danley Danker Weights  ? 09/12/21 2010  ?Weight: 72.1 kg  ? ?Physical Exam ?HENT:  ?   Head: Normocephalic.  ?   Mouth/Throat:  ?   Pharynx: No oropharyngeal exudate.  ?Eyes:  ?   General: Lids are normal.  ?   Conjunctiva/sclera: Conjunctivae normal.  ?Cardiovascular:  ?   Rate and Rhythm: Normal rate and regular rhythm.  ?   Heart sounds: Normal heart sounds, S1 normal and S2 normal.  ?Pulmonary:  ?   Breath sounds: No decreased breath sounds, wheezing, rhonchi or rales.  ?Abdominal:  ?   Palpations: Abdomen is soft.  ?   Tenderness: There is no abdominal tenderness.  ?Musculoskeletal:  ?   Right lower leg: No swelling.  ?   Left lower leg: No swelling.  ?Skin: ?   General: Skin is warm.  ?   Findings: No rash.  ?Neurological:  ?   Mental Status: She is alert and oriented to  person, place, and time.  ?  ? ?Condition at discharge: stable ? ?The results of significant diagnostics from this hospitalization (including imaging, microbiology, ancillary and laboratory) are listed below for reference.  ? ?Imaging Studies: ?No results found. ? ?Microbiology: ?Results for orders placed or performed during the hospital encounter of 09/12/21  ?Resp Panel by RT-PCR (Flu A&B, Covid) Nasopharyngeal Swab     Status: None  ? Collection Time: 09/12/21  9:38 PM  ? Specimen: Nasopharyngeal Swab; Nasopharyngeal(NP) swabs in vial transport medium  ?Result Value Ref Range Status  ? SARS Coronavirus 2 by RT PCR NEGATIVE NEGATIVE Final  ?  Comment: (NOTE) ?SARS-CoV-2 target nucleic acids are NOT DETECTED. ? ?The SARS-CoV-2 RNA is generally detectable in upper respiratory ?specimens during the acute phase of infection. The lowest ?concentration of SARS-CoV-2 viral copies this assay  can detect is ?138 copies/mL. A negative result does not preclude SARS-Cov-2 ?infection and should not be used as the sole basis for treatment or ?other patient management decisions. A negative result may occur with  ?improper specimen collection/handling, submission of specimen other ?than nasopharyngeal swab, presence of viral mutation(s) within the ?areas targeted by this assay, and inadequate number of viral ?copies(<138 copies/mL). A negative result must be combined with ?clinical observations, patient history, and epidemiological ?information. The expected result is Negative. ? ?Fact Sheet for Patients:  ?EntrepreneurPulse.com.au ? ?Fact Sheet for Healthcare Providers:  ?IncredibleEmployment.be ? ?This test is no t yet approved or cleared by the [place] FDA and  ?has been authorized for detection and/or diagnosis of SARS-CoV-2 by ?FDA under an Emergency Use Authorization (EUA). This EUA will remain  ?in effect (meaning this test can be used) for the duration of the ?COVID-19 declaration  under Section 564(b)(1) of the Act, 21 ?U.S.C.section 360bbb-3(b)(1), unless the authorization is terminated  ?or revoked sooner.  ? ? ?  ? Influenza A by PCR NEGATIVE NEGATIVE Final  ? Influenza B by [facility]

## 2021-09-16 NOTE — TOC Transition Note (Signed)
Transition of Care (TOC) - CM/SW Discharge Note ? ? ?Patient Details  ?Name: Sharon Melton ?MRN: [id number] ?Date of Birth: 11-08-1960 ? ?Transition of Care (TOC) CM/SW Contact:  ?Chapman Fitch, RN ?Phone Number: ?09/16/2021, 2:24 PM ? ? ?Clinical Narrative:    ? ? ?To pick up medications from Medication Management  at discharge  ?  ?Barriers to Discharge: Continued Medical Work up ? ? ?Patient Goals and CMS Choice ?  ?  ?  ? ?Discharge Placement ?  ?           ?  ?  ?  ?  ? ?Discharge Plan and Services ?  ?  ?           ?  ?  ?  ?  ?  ?  ?  ?  ?  ?  ? ?Social Determinants of Health (SDOH) Interventions ?  ? ? ?Readmission Risk Interventions ?   ? View : No data to display.  ?  ?  ?  ? ? ? ? ? ?

## 2021-09-16 NOTE — Progress Notes (Signed)
Ezequiel Ganser to be D/C'd Home per MD order.  Discussed prescriptions and follow up appointments with the patient. Prescriptions given to patient, medication list explained in detail. Pt verbalized understanding. ? ?Allergies as of 09/16/2021   ?No Known Allergies ?  ? ?  ?Medication List  ?  ? ?STOP taking these medications   ? ?ALKA-SELTZER PLS ALLERGY & CGH PO ?  ?losartan-hydrochlorothiazide 100-25 MG tablet ?Commonly known as: HYZAAR ?  ?metFORMIN 500 MG 24 hr tablet ?Commonly known as: GLUCOPHAGE-XR ?  ?Tyler Aas FlexTouch 100 UNIT/ML FlexTouch Pen ?Generic drug: insulin degludec ?  ? ?  ? ?TAKE these medications   ? ?amLODipine 5 MG tablet ?Commonly known as: NORVASC ?Take 1 tablet (5 mg total) by mouth once daily. ?What changed:  ?medication strength ?how much to take ?  ?atorvastatin 20 MG tablet ?Commonly known as: LIPITOR ?Take 1 tablet (20 mg total) by mouth once daily. ?  ?Basaglar Tempo Pen 100 UNIT/ML Sopn ?Generic drug: Insulin Glargine w/ Trans Port ?Inject 25 Units into the skin daily. ?  ?carvedilol 3.125 MG tablet ?Commonly known as: COREG ?Take 1 tablet (3.125 mg total) by mouth 2 (two) times daily with a meal. ?  ?chlordiazePOXIDE 10 MG capsule ?Commonly known as: LIBRIUM ?One tab po twice a day for two days then one tab po daily for three days then stop ?  ?clopidogrel 75 MG tablet ?Commonly known as: PLAVIX ?Take 1 tablet (75 mg total) by mouth once daily. ?  ?Insulin Pen Needle 32G X 4 MM Misc ?USE AS DIRECTED FOR INSULIN. ?What changed: additional instructions ?  ?losartan 100 MG tablet ?Commonly known as: COZAAR ?Take 1 tablet (100 mg total) by mouth once daily. ?Start taking on: September 17, 2021 ?  ?Magnesium Oxide 400 MG Caps ?Take 1 capsule (400 mg total) by mouth daily. ?  ?multivitamin tablet ?Take 1 tablet by mouth daily. ?  ?NovoLOG FlexPen 100 UNIT/ML FlexPen ?Generic drug: insulin aspart ?Inject 5 Units into the skin 3 (three) times daily with meals. ?  ?PARoxetine 30 MG  tablet ?Commonly known as: PAXIL ?Take 1 tablet (30 mg total) by mouth once daily. ?  ?phosphorus 155-852-130 MG tablet ?Commonly known as: K PHOS NEUTRAL ?Take 2 tablets (500 mg total) by mouth 2 (two) times daily. ?  ?Rightest GL300 Lancets Misc ?Use as directed to check blood sugar up to four times daily. ?  ?Rightest GM550 Blood Glucose w/Device Kit ?Use as directed to check blood sugar up to four times daily. ?  ?Rightest GS550 Blood Glucose test strip ?Generic drug: glucose blood ?Use as directed to check blood sugar up to four times daily. ?  ?thiamine 100 MG tablet ?Commonly known as: Vitamin B-1 ?Take 1 tablet (100 mg total) by mouth once daily. ?Start taking on: September 17, 2021 ?  ?traZODone 50 MG tablet ?Commonly known as: DESYREL ?Take 1/2 tablet (25 mg total) by mouth once nightly at bedtime as needed for sleep. ?What changed: how much to take ?  ? ?  ? ? ?Vitals:  ? 09/16/21 1049 09/16/21 1052  ?BP: (!) 163/80 (!) 163/84  ?Pulse: 65 60  ?Resp:    ?Temp:    ?SpO2: 93% 96%  ? ? ?Tele box removed and returned. Skin clean, dry and intact without evidence of skin break down, no evidence of skin tears noted. IV catheter discontinued intact. Site without signs and symptoms of complications. Dressing and pressure applied. Pt denies pain at this time. No complaints noted. ? ?  An After Visit Summary was printed and given to the patient. ?Patient escorted via [place], and D/C home via private auto. ? ?Rolley Sims  ?

## 2021-09-16 NOTE — Assessment & Plan Note (Signed)
Sodium 129 on presentation and came up to 137 upon discharge.  Likely secondary to alcohol. ?

## 2021-09-17 ENCOUNTER — Telehealth: Payer: Self-pay

## 2021-09-17 NOTE — Telephone Encounter (Signed)
Transition Care Management Unsuccessful Follow-up Telephone Call ? ?Date of discharge and from where:  09/16/21 [facility] ? ?Attempts:  1st Attempt ? ?Reason for unsuccessful TCM follow-up call:  Left voice message to call office as needed for hospital follow up.  ? ?  ?

## 2021-09-23 ENCOUNTER — Other Ambulatory Visit: Payer: Self-pay

## 2021-09-24 ENCOUNTER — Telehealth: Payer: Self-pay | Admitting: Pharmacy Technician

## 2021-09-24 NOTE — Telephone Encounter (Signed)
Patient signed [facility] Attestation.  Approved for [facility] medications.  Did not provide poi. ? ?Sherilyn Dacosta ?Care Manager ?[facility] ?

## 2021-10-09 ENCOUNTER — Other Ambulatory Visit: Payer: Self-pay | Admitting: Internal Medicine

## 2021-11-01 ENCOUNTER — Encounter: Payer: Self-pay | Admitting: Internal Medicine

## 2021-11-04 ENCOUNTER — Telehealth: Payer: Self-pay

## 2021-11-04 ENCOUNTER — Encounter: Payer: Self-pay | Admitting: Internal Medicine

## 2021-11-04 ENCOUNTER — Ambulatory Visit (INDEPENDENT_AMBULATORY_CARE_PROVIDER_SITE_OTHER): Payer: Self-pay | Admitting: Internal Medicine

## 2021-11-04 VITALS — BP 176/84 | HR 74 | Temp 97.8°F | Ht 65.0 in | Wt 146.2 lb

## 2021-11-04 DIAGNOSIS — K7010 Alcoholic hepatitis without ascites: Secondary | ICD-10-CM

## 2021-11-04 DIAGNOSIS — I10 Essential (primary) hypertension: Secondary | ICD-10-CM

## 2021-11-04 DIAGNOSIS — E876 Hypokalemia: Secondary | ICD-10-CM

## 2021-11-04 DIAGNOSIS — E1169 Type 2 diabetes mellitus with other specified complication: Secondary | ICD-10-CM

## 2021-11-04 DIAGNOSIS — F1021 Alcohol dependence, in remission: Secondary | ICD-10-CM

## 2021-11-04 DIAGNOSIS — E785 Hyperlipidemia, unspecified: Secondary | ICD-10-CM

## 2021-11-04 LAB — COMPREHENSIVE METABOLIC PANEL
ALT: 24 U/L (ref 0–35)
AST: 51 U/L — ABNORMAL HIGH (ref 0–37)
Albumin: 4.3 g/dL (ref 3.5–5.2)
Alkaline Phosphatase: 102 U/L (ref 39–117)
BUN: 9 mg/dL (ref 6–23)
CO2: 30 mEq/L (ref 19–32)
Calcium: 10.4 mg/dL (ref 8.4–10.5)
Chloride: 102 mEq/L (ref 96–112)
Creatinine, Ser: 0.8 mg/dL (ref 0.40–1.20)
GFR: 78.86 mL/min (ref >60.00)
Glucose, Bld: 161 mg/dL — ABNORMAL HIGH (ref 70–99)
Potassium: 3.2 mEq/L — ABNORMAL LOW (ref 3.5–5.1)
Sodium: 141 mEq/L (ref 135–145)
Total Bilirubin: 0.5 mg/dL (ref 0.2–1.2)
Total Protein: 7 g/dL (ref 6.0–8.3)

## 2021-11-04 LAB — MAGNESIUM: Magnesium: 1.5 mg/dL (ref 1.5–2.5)

## 2021-11-04 LAB — MICROALBUMIN / CREATININE URINE RATIO
Creatinine,U: 62.4 mg/dL
Microalb Creat Ratio: 2.9 mg/g (ref 0.0–30.0)
Microalb, Ur: 1.8 mg/dL (ref 0.0–1.9)

## 2021-11-04 LAB — LIPID PANEL
Cholesterol: 246 mg/dL — ABNORMAL HIGH (ref 0–200)
HDL: 42.1 mg/dL (ref >39.00)
LDL Cholesterol: 183 mg/dL — ABNORMAL HIGH (ref 0–99)
NonHDL: 204.04
Total CHOL/HDL Ratio: 6
Triglycerides: 105 mg/dL (ref 0.0–149.0)
VLDL: 21 mg/dL (ref 0.0–40.0)

## 2021-11-04 LAB — HEMOGLOBIN A1C: Hgb A1c MFr Bld: 10 % — ABNORMAL HIGH (ref 4.6–6.5)

## 2021-11-04 MED ORDER — CLOPIDOGREL BISULFATE 75 MG PO TABS: 75.0000 mg | ORAL_TABLET | Freq: Every day | ORAL | 1 refills | Status: DC

## 2021-11-04 MED ORDER — LOSARTAN POTASSIUM 100 MG PO TABS: 100.0000 mg | ORAL_TABLET | Freq: Every day | ORAL | 1 refills | Status: DC

## 2021-11-04 MED ORDER — GLIPIZIDE 5 MG PO TABS: 5.0000 mg | ORAL_TABLET | Freq: Two times a day (BID) | ORAL | 3 refills | Status: DC

## 2021-11-04 MED ORDER — TRAZODONE HCL 50 MG PO TABS: 50.0000 mg | ORAL_TABLET | Freq: Every evening | ORAL | 3 refills | Status: DC | PRN

## 2021-11-04 MED ORDER — CARVEDILOL 3.125 MG PO TABS: 3.1250 mg | ORAL_TABLET | Freq: Two times a day (BID) | ORAL | 1 refills | Status: DC

## 2021-11-04 MED ORDER — ATORVASTATIN CALCIUM 20 MG PO TABS: 20.0000 mg | ORAL_TABLET | Freq: Every day | ORAL | 1 refills | Status: DC

## 2021-11-04 NOTE — Progress Notes (Signed)
Subjective:  Patient ID: Sharon Melton, female    DOB: 1960/12/03  Age: 61 y.o. MRN: [id number]  CC: The primary encounter diagnosis was Type 2 diabetes mellitus with hyperlipidemia ([place]). Diagnoses of Hypomagnesemia, Hypokalemia, Hypertension, unspecified type, Hypophosphatemia, Alcoholic hepatitis without ascites, and Alcoholism in recovery [facility]) were also pertinent to this visit.   HPI Sharon Melton presents for follow up on multiple issues  Chief Complaint  Patient presents with   Follow-up    Medication refill    1) type 2 DM, HTN , CAD:  has not been taking any medications in several month  except insulin.    taking Basaglar 25 untis  and novolog  using twice daily 5 units per dose  .  Blood sugars have been 113 to 192 fasting mostly in the 150 , nothing above 200.     2) alcohol abuse:  started drinking heavily in January,  by the time she was admitted to [facility] for detox on April 28.  She was drinking vodka straight out of the bottle.  [facility] Hospitalization reviewed.   She states that she has been soer since discharge.  For r 7 weeks.  Not going to AA . Husband and family are supportive and no one is drinking in the house or around her.  Her relationshis with her daughter is "hit and miss" complicated by daughter's bipolar disorder, patient's husband is working.  Some financial strains regarding the insulin   3) Alcoholic hepatitis, electrolyte disturbances.:  reviewed labs.     Outpatient Medications Prior to Visit  Medication Sig Dispense Refill   blood glucose meter kit and supplies KIT Use as directed to check blood sugar up to four times daily. 1 each 0   glucose blood (RIGHTEST GS550 BLOOD GLUCOSE) test strip Use as directed to check blood sugar up to four times daily. 100 strip 0   Insulin Glargine (BASAGLAR KWIKPEN) 100 UNIT/ML Inject 25 Units into the skin once daily. 15 mL 0   Insulin Pen Needle 32G X 4 MM MISC USE AS DIRECTED FOR INSULIN. 100 each 0   Magnesium  Oxide 400 MG CAPS Take 1 capsule (400 mg total) by mouth daily. 30 capsule 1   Multiple Vitamin (MULTIVITAMIN) tablet Take 1 tablet by mouth daily. 30 tablet 1   Rightest GL300 Lancets MISC Use as directed to check blood sugar up to four times daily. 100 each 0   thiamine (VITAMIN B-1) 100 MG tablet Take 1 tablet (100 mg total) by mouth once daily. 30 tablet 1   amLODipine (NORVASC) 5 MG tablet Take 1 tablet (5 mg total) by mouth once daily. 30 tablet 1   atorvastatin (LIPITOR) 20 MG tablet Take 1 tablet (20 mg total) by mouth once daily. 30 tablet 1   carvedilol (COREG) 3.125 MG tablet Take 1 tablet (3.125 mg total) by mouth 2 (two) times daily with a meal. 60 tablet 1   chlordiazePOXIDE (LIBRIUM) 10 MG capsule One tab po twice a day for two days then one tab po daily for three days then stop 7 capsule 0   clopidogrel (PLAVIX) 75 MG tablet Take 1 tablet (75 mg total) by mouth once daily. 30 tablet 1   insulin aspart (NOVOLOG FLEXPEN) 100 UNIT/ML FlexPen Inject 5 Units into the skin 3 (three) times daily with meals. 15 mL 0   losartan (COZAAR) 100 MG tablet Take 1 tablet (100 mg total) by mouth once daily. 30 tablet 1   PARoxetine (PAXIL) 30  MG tablet Take 1 tablet (30 mg total) by mouth once daily. 30 tablet 1   phosphorus (K PHOS NEUTRAL) 155-852-130 MG tablet Take 2 tablets (500 mg total) by mouth 2 (two) times daily. 2 tablet 0   traZODone (DESYREL) 50 MG tablet Take 1/2 tablet (25 mg total) by mouth once nightly at bedtime as needed for sleep. 15 tablet 1   No facility-administered medications prior to visit.    Review of Systems;  Patient denies headache, fevers, malaise, unintentional weight loss, skin rash, eye pain, sinus congestion and sinus pain, sore throat, dysphagia,  hemoptysis , cough, dyspnea, wheezing, chest pain, palpitations, orthopnea, edema, abdominal pain, nausea, melena, diarrhea, constipation, flank pain, dysuria, hematuria, urinary  Frequency, nocturia, numbness,  tingling, seizures,  Focal weakness, Loss of consciousness,  Tremor, insomnia, depression, anxiety, and suicidal ideation.      Objective:  BP (!) 176/84 (BP Location: Left Arm, Patient Position: Sitting, Cuff Size: Normal)   Pulse 74   Temp 97.8 F (36.6 C) (Oral)   Ht $R'5\' 5"'Ee$  (1.651 m)   Wt 146 lb 3.2 oz (66.3 kg)   SpO2 95%   BMI 24.33 kg/m   BP Readings from Last 3 Encounters:  11/04/21 (!) 176/84  09/16/21 (!) 163/84  04/23/21 (!) 172/98    Wt Readings from Last 3 Encounters:  11/04/21 146 lb 3.2 oz (66.3 kg)  09/12/21 159 lb (72.1 kg)  05/21/21 146 lb (66.2 kg)    General appearance: alert, cooperative and appears stated age Ears: normal TM's and external ear canals both ears Throat: lips, mucosa, and tongue normal; teeth and gums normal Neck: no adenopathy, no carotid bruit, supple, symmetrical, trachea midline and thyroid not enlarged, symmetric, no tenderness/mass/nodules Back: symmetric, no curvature. ROM normal. No CVA tenderness. Lungs: clear to auscultation bilaterally Heart: regular rate and rhythm, S1, S2 normal, no murmur, click, rub or gallop Abdomen: soft, non-tender; bowel sounds normal; no masses,  no organomegaly Pulses: 2+ and symmetric Skin: Skin color, texture, turgor normal. No rashes or lesions Lymph nodes: Cervical, supraclavicular, and axillary nodes normal.  Lab Results  Component Value Date   HGBA1C 10.0 (H) 11/04/2021   HGBA1C 11.4 (A) 04/23/2021   HGBA1C 6.4 (A) 04/17/2018    Lab Results  Component Value Date   CREATININE 0.80 11/04/2021   CREATININE 0.58 09/16/2021   CREATININE 0.58 09/14/2021    Lab Results  Component Value Date   WBC 6.9 09/12/2021   HGB 13.1 09/12/2021   HCT 39.9 09/12/2021   PLT 225 09/12/2021   GLUCOSE 161 (H) 11/04/2021   CHOL 246 (H) 11/04/2021   TRIG 105.0 11/04/2021   HDL 42.10 11/04/2021   LDLDIRECT 164.0 07/22/2016   LDLCALC 183 (H) 11/04/2021   ALT 24 11/04/2021   AST 51 (H) 11/04/2021   NA  141 11/04/2021   K 3.2 (L) 11/04/2021   CL 102 11/04/2021   CREATININE 0.80 11/04/2021   BUN 9 11/04/2021   CO2 30 11/04/2021   TSH 0.87 12/07/2012   HGBA1C 10.0 (H) 11/04/2021   MICROALBUR 1.8 11/04/2021    No results found.  Assessment & Plan:   Problem List Items Addressed This Visit     Type 2 diabetes mellitus with hyperlipidemia ([place]) - Primary    Despite her elevated A1c,  Her current  BS are not over 200 using 5 units of  novolog tiwce daily  and Tresiba 25 units .  Will start Glipizide 2. Mg bid .  Samples of [place]  given. Advised to keep a  BS log .  Resume ARB and statin   Lab Results  Component Value Date   HGBA1C 10.0 (H) 11/04/2021   Lab Results  Component Value Date   CREATININE 0.80 11/04/2021   Lab Results  Component Value Date   MICROALBUR 1.8 11/04/2021   MICROALBUR 5.5 04/23/2021          Relevant Medications   glipiZIDE (GLUCOTROL) 5 MG tablet   atorvastatin (LIPITOR) 20 MG tablet   carvedilol (COREG) 3.125 MG tablet   losartan (COZAAR) 100 MG tablet   Other Relevant Orders   Hemoglobin A1c (Completed)   Comprehensive metabolic panel (Completed)   Lipid panel (Completed)   Microalbumin / creatinine urine ratio (Completed)   Hypophosphatemia    Critically low during hospitalization..  Low normal at discharge. Will continue another 7 days of K Phos       Hypomagnesemia   Relevant Orders   Magnesium (Completed)   Hypokalemia    Resume 20 meq daily.  Mag level is low normal at 1.5   Lab Results  Component Value Date   NA 141 11/04/2021   K 3.2 (L) 11/04/2021   CL 102 11/04/2021   CO2 30 11/04/2021   .      HTN (hypertension)    Advised to resume coreg and cozaar for now       Relevant Medications   atorvastatin (LIPITOR) 20 MG tablet   carvedilol (COREG) 3.125 MG tablet   losartan (COZAAR) 100 MG tablet   Alcoholism in recovery [facility])    Reviewed her plan for sobriety, which includes total abstinence but  unfortunately does  not include AA meetings because her family is supportive of her efforts. ..  Advised her to consider adding weekly meetings      Alcoholic hepatitis without ascites    Liver enzymes are normalizing with abstinence.  Lab Results  Component Value Date   ALT 24 11/04/2021   AST 51 (H) 11/04/2021   ALKPHOS 102 11/04/2021   BILITOT 0.5 11/04/2021         I spent a total of  30   minutes with this patient in a face to face visit on the date of this encounter reviewing the last office visit with me in December.  Her hospitailization in April for alcoholic hepatitis ,  patient's diet and eating habits, home blood sugars,  her plan for maintaining sobriety,  and post visit ordering of testing and therapeutics.    Follow-up: Return in about 4 weeks (around 12/02/2021).   Crecencio Mc, MD

## 2021-11-04 NOTE — Patient Instructions (Addendum)
Welcome back!    When you start the glipizide 5 mg twice daily with breakfast and dinner ,  you can  stop the novolog (mealtime insulin)  Follow  your  morning and 2 hr after  dinner sugars for now.    You can reduce  Tresiba from 25 units to 22 units  if your morning fasting sugar is < 120     Return in one month

## 2021-11-04 NOTE — Telephone Encounter (Signed)
Medication Samples have been provided to the patient.  Drug name: Sharon Melton       Strength: 100U        Qty: 1 box  LOT: [id number]  Exp.Date: 10/21/2023  Dosing instructions: Inject 25 units to 22 units  if your morning fasting sugar is < 120    The patient has been instructed regarding the correct time, dose, and frequency of taking this medication, including desired effects and most common side effects.   Sharon Melton 1:11 PM 11/04/2021

## 2021-11-05 ENCOUNTER — Encounter: Payer: Self-pay | Admitting: Internal Medicine

## 2021-11-05 DIAGNOSIS — F1021 Alcohol dependence, in remission: Secondary | ICD-10-CM | POA: Insufficient documentation

## 2021-11-05 MED ORDER — K PHOS MONO-SOD PHOS DI & MONO 155-852-130 MG PO TABS: 500.0000 mg | ORAL_TABLET | Freq: Every day | ORAL | 0 refills | Status: DC

## 2021-11-05 NOTE — Assessment & Plan Note (Signed)
Resume 20 meq daily.  Mag level is low normal at 1.5   Lab Results  Component Value Date   NA 141 11/04/2021   K 3.2 (L) 11/04/2021   CL 102 11/04/2021   CO2 30 11/04/2021   .

## 2021-11-05 NOTE — Assessment & Plan Note (Signed)
Advised to resume coreg and cozaar for now

## 2021-11-05 NOTE — Assessment & Plan Note (Signed)
Liver enzymes are normalizing with abstinence.  Lab Results  Component Value Date   ALT 24 11/04/2021   AST 51 (H) 11/04/2021   ALKPHOS 102 11/04/2021   BILITOT 0.5 11/04/2021

## 2021-11-05 NOTE — Assessment & Plan Note (Signed)
Despite her elevated A1c,  Her current  BS are not over 200 using 5 units of  novolog tiwce daily  and Tresiba 25 units .  Will start Glipizide 2. Mg bid .  Samples of [place] given. Advised to keep a  BS log .  Resume ARB and statin   Lab Results  Component Value Date   HGBA1C 10.0 (H) 11/04/2021   Lab Results  Component Value Date   CREATININE 0.80 11/04/2021   Lab Results  Component Value Date   MICROALBUR 1.8 11/04/2021   MICROALBUR 5.5 04/23/2021

## 2021-11-05 NOTE — Assessment & Plan Note (Addendum)
Reviewed her plan for sobriety, which includes total abstinence but  unfortunately does not include AA meetings because her family is supportive of her efforts. ..  Advised her to consider adding weekly meetings

## 2021-11-05 NOTE — Assessment & Plan Note (Addendum)
Critically low during hospitalization..  Low normal at discharge. Will continue another 7 days of K Phos

## 2021-11-19 IMAGING — CR DG CHEST 2V
2 series · 2 of 2 positions shown · non-contrast
Comparison: None.

CLINICAL DATA: Weakness.

EXAM:
CHEST - 2 VIEW

[chest pa]
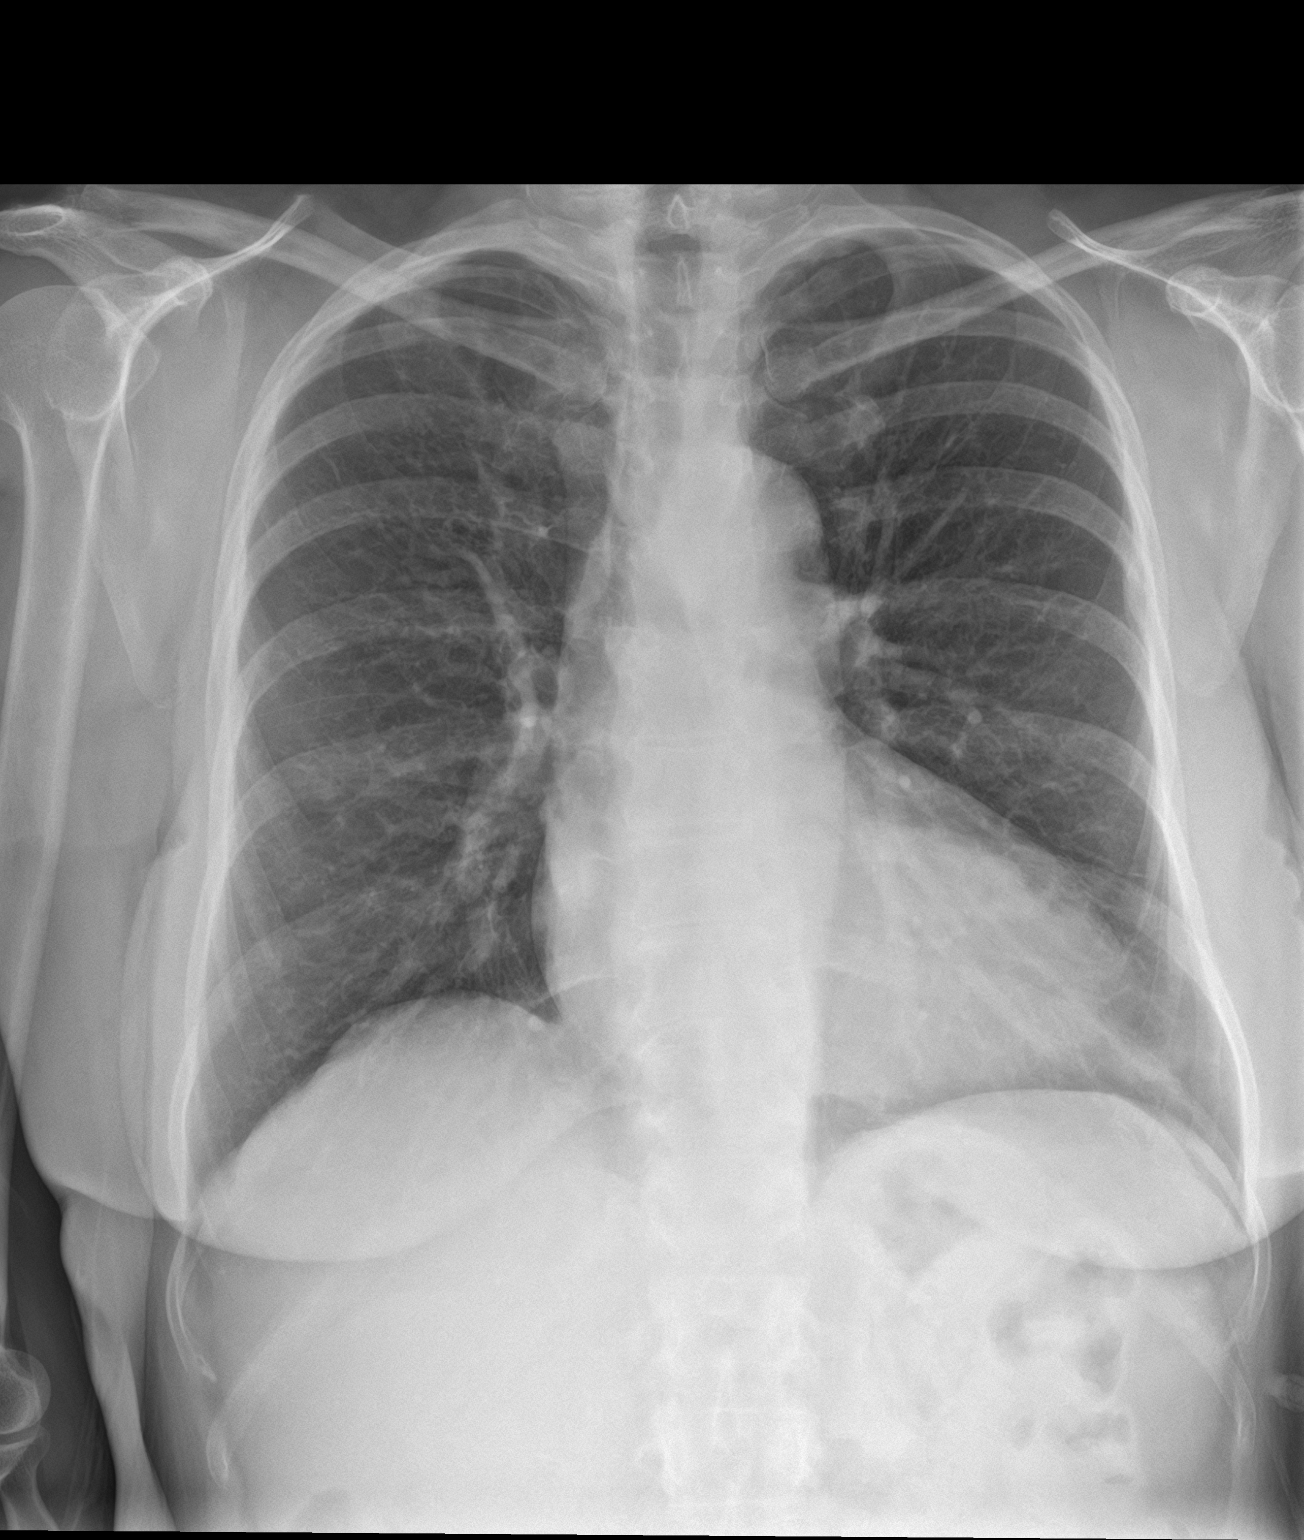

[chest lat]
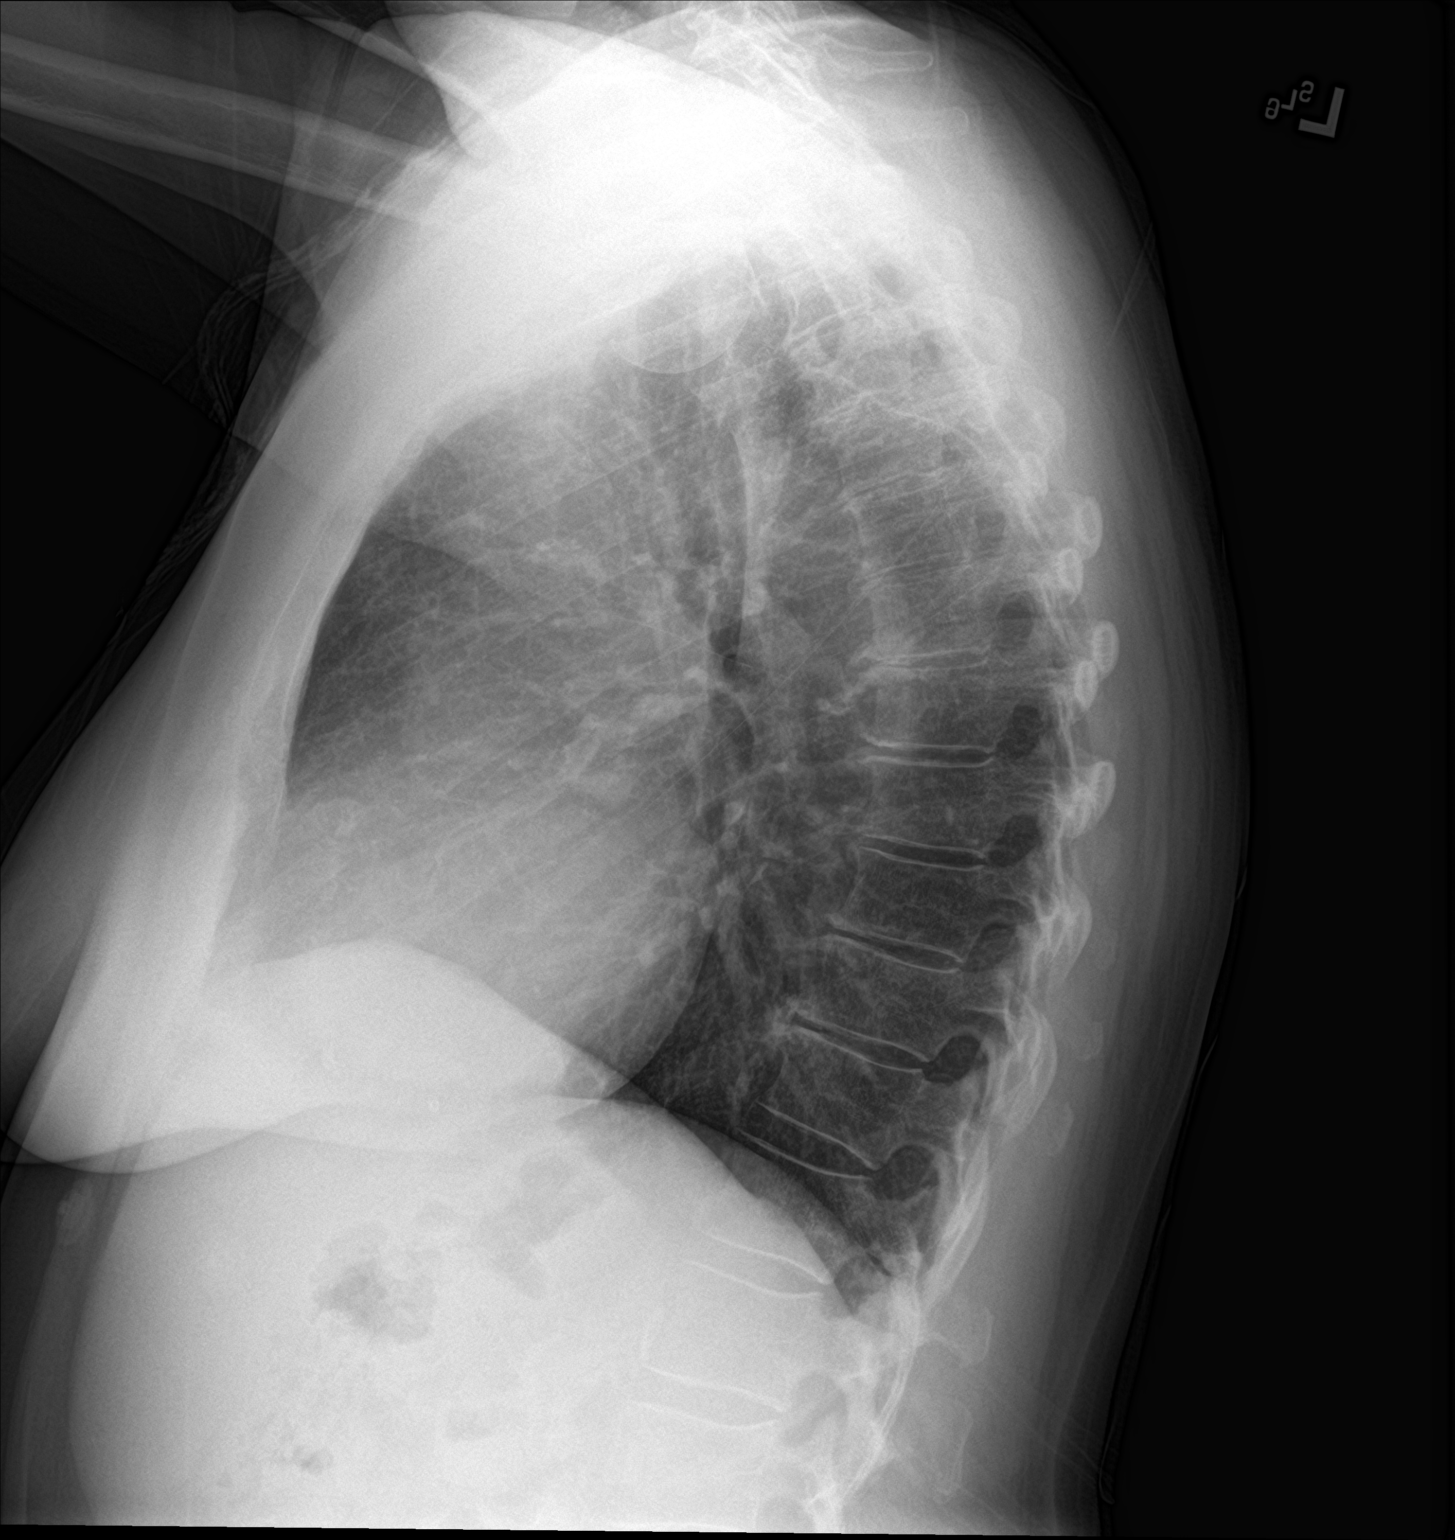

[2 of 2 positions shown; findings below may reference images not displayed]

FINDINGS: Enlarged cardiac silhouette. Streaky left basilar opacities. No
confluent consolidation. No visible pleural effusions or
pneumothorax. Mild to moderate multilevel thoracic degenerative disc
height loss. Osteopenia.
IMPRESSION: 1. Streaky left basilar opacities, favor atelectasis.
2. Cardiomegaly.

## 2021-12-31 ENCOUNTER — Other Ambulatory Visit: Payer: Self-pay | Admitting: Internal Medicine

## 2022-02-25 ENCOUNTER — Telehealth: Payer: Self-pay | Admitting: Internal Medicine

## 2022-02-25 NOTE — Telephone Encounter (Signed)
Pt need refill on losartan, glipiZIDE, carvedilol, clopidogrel sent to [facility]

## 2022-02-25 NOTE — Telephone Encounter (Signed)
Medication have been refilled and pt is aware.  

## 2022-02-28 ENCOUNTER — Other Ambulatory Visit: Payer: Self-pay

## 2022-02-28 MED ORDER — ATORVASTATIN CALCIUM 20 MG PO TABS: ORAL_TABLET | ORAL | 3 refills | Status: DC

## 2022-04-26 ENCOUNTER — Telehealth: Payer: Self-pay | Admitting: Internal Medicine

## 2022-04-27 NOTE — Telephone Encounter (Signed)
Pt need a refill on losartan sent to [facility] 

## 2022-04-28 NOTE — Telephone Encounter (Signed)
Medication was refilled yesterday.

## 2022-05-04 NOTE — Telephone Encounter (Signed)
MyChart messgae sent to patient. 

## 2022-06-25 ENCOUNTER — Telehealth: Payer: Self-pay | Admitting: Internal Medicine

## 2022-07-04 NOTE — Telephone Encounter (Addendum)
Pt need a refill on carvedilol, lorsartan and clopidogrel sent to [facility]

## 2022-07-05 MED ORDER — LOSARTAN POTASSIUM 100 MG PO TABS
ORAL_TABLET | ORAL | 1 refills | Status: DC
Start: 2022-07-05 — End: 2022-12-29

## 2022-07-05 MED ORDER — CLOPIDOGREL BISULFATE 75 MG PO TABS: ORAL_TABLET | ORAL | 1 refills | Status: DC

## 2022-07-05 MED ORDER — CARVEDILOL 3.125 MG PO TABS: ORAL_TABLET | ORAL | 1 refills | Status: DC

## 2022-07-05 NOTE — Telephone Encounter (Signed)
Medications have been refilled ?

## 2022-07-05 NOTE — Addendum Note (Signed)
Addended by: Adair Laundry on: 07/05/2022 08:52 AM   Modules accepted: Orders

## 2022-10-21 ENCOUNTER — Telehealth: Payer: Self-pay | Admitting: Internal Medicine

## 2022-10-21 MED ORDER — TRAZODONE HCL 50 MG PO TABS: 50.0000 mg | ORAL_TABLET | Freq: Every evening | ORAL | 0 refills | Status: DC | PRN

## 2022-10-21 NOTE — Addendum Note (Signed)
Addended by: Sandy Salaam on: 10/21/2022 11:16 AM   Modules accepted: Orders

## 2022-10-21 NOTE — Telephone Encounter (Signed)
Medication has been refilled for 30 days only. 

## 2022-10-21 NOTE — Telephone Encounter (Signed)
Prescription Request  10/21/2022  LOV: 11/04/2021  What is the name of the medication or equipment? traZODone (DESYREL) 50 MG tablet   Have you contacted your pharmacy to request a refill? Yes   Which pharmacy would you like this sent to?  [id number] [place], [place] - [address] AT [facility] OF [address] [place] [address] Phone: [phone] Fax: [phone]     Patient notified that their request is being sent to the clinical staff for review and that they should receive a response within 2 business days.   Please advise at Mobile [phone] (mobile)

## 2022-10-24 ENCOUNTER — Other Ambulatory Visit: Payer: Self-pay

## 2022-10-24 MED ORDER — TRAZODONE HCL 50 MG PO TABS: 50.0000 mg | ORAL_TABLET | Freq: Every evening | ORAL | 0 refills | Status: DC | PRN

## 2022-10-24 MED ORDER — GLIPIZIDE 5 MG PO TABS: ORAL_TABLET | ORAL | 0 refills | Status: DC

## 2022-11-23 ENCOUNTER — Other Ambulatory Visit: Payer: Self-pay | Admitting: Internal Medicine

## 2022-12-07 ENCOUNTER — Encounter: Payer: Self-pay | Admitting: Internal Medicine

## 2022-12-07 ENCOUNTER — Ambulatory Visit (INDEPENDENT_AMBULATORY_CARE_PROVIDER_SITE_OTHER): Payer: Self-pay | Admitting: Internal Medicine

## 2022-12-07 VITALS — BP 162/84 | HR 74 | Temp 98.0°F | Ht 65.0 in | Wt 152.2 lb

## 2022-12-07 DIAGNOSIS — E1140 Type 2 diabetes mellitus with diabetic neuropathy, unspecified: Secondary | ICD-10-CM

## 2022-12-07 DIAGNOSIS — R200 Anesthesia of skin: Secondary | ICD-10-CM

## 2022-12-07 DIAGNOSIS — I2510 Atherosclerotic heart disease of native coronary artery without angina pectoris: Secondary | ICD-10-CM

## 2022-12-07 DIAGNOSIS — E785 Hyperlipidemia, unspecified: Secondary | ICD-10-CM

## 2022-12-07 DIAGNOSIS — F1021 Alcohol dependence, in remission: Secondary | ICD-10-CM

## 2022-12-07 DIAGNOSIS — I10 Essential (primary) hypertension: Secondary | ICD-10-CM

## 2022-12-07 DIAGNOSIS — Z7984 Long term (current) use of oral hypoglycemic drugs: Secondary | ICD-10-CM

## 2022-12-07 DIAGNOSIS — E1169 Type 2 diabetes mellitus with other specified complication: Secondary | ICD-10-CM

## 2022-12-07 DIAGNOSIS — R202 Paresthesia of skin: Secondary | ICD-10-CM

## 2022-12-07 MED ORDER — GLIPIZIDE 5 MG PO TABS: ORAL_TABLET | ORAL | 0 refills | Status: DC

## 2022-12-07 MED ORDER — TRAZODONE HCL 50 MG PO TABS: 75.0000 mg | ORAL_TABLET | Freq: Every evening | ORAL | 5 refills | Status: DC | PRN

## 2022-12-07 MED ORDER — TRAZODONE HCL 50 MG PO TABS: 50.0000 mg | ORAL_TABLET | Freq: Every evening | ORAL | 0 refills | Status: DC | PRN

## 2022-12-07 MED ORDER — CLOPIDOGREL BISULFATE 75 MG PO TABS: ORAL_TABLET | ORAL | 1 refills | Status: DC

## 2022-12-07 MED ORDER — CARVEDILOL 3.125 MG PO TABS: ORAL_TABLET | ORAL | 2 refills | Status: DC

## 2022-12-07 NOTE — Patient Instructions (Signed)
Welcome back !  Good to see you!  Depending on the results of today's labs , there may be a few medication,  changes , but continue what you are doing now until yu hear from me

## 2022-12-07 NOTE — Progress Notes (Signed)
Subjective:  Patient ID: Sharon Melton, female    DOB: 27-Jul-1960  Age: 62 y.o. MRN: [id number]  CC: The primary encounter diagnosis was Type 2 diabetes mellitus with hyperlipidemia (HCC). Diagnoses of Essential hypertension, Hyperlipidemia with target LDL less than 70, Numbness and tingling in both hands, Hypertension, unspecified type, Alcoholism in recovery [facility]), Coronary artery disease involving native coronary artery of native heart without angina pectoris, Numbness and tingling of upper and lower extremities of both sides, and Type 2 diabetes mellitus with diabetic neuropathy, without long-term current use of insulin (HCC) were also pertinent to this visit.   HPI Sharon Melton presents for follow up on type  2 DM,  CAD,  and alcohol abuse . She was last seen one year ago.  Chief Complaint  Patient presents with   Annual Exam   1) Alcohol abuse.  :  she has been abstinent for 16 months .  She STOPPED drinking on her own,  did not use a 12 step program.   . TAKING CARE OF 2 GRANDCHILDREN AGES 5 AND 8 ,  AUTISTIC   HOME SCHOOLING THEM   FAMILY DYNAMICS  IMPROVING BETWEEN FATHER AND DAUGHTER  2) type 2 DM:   She  feels generally well,  is walking regularly , weight  is normal, . Checking  blood sugars once daily at variable times, usually only if she feels she may be having a hypoglycemic event. .  BS have been under 130 fasting and < 150 post prandially.  Aking glipizide,  no insulin. Denies any recent hypoglyemic events.  T Following a carbohydrate modified diet  days per week. Having  numbness and tingling of hands and fet:  has been occurring on an intermittent basis for the past 2 years.  Random,  no t always at night,  usually  during the day.  No neck pain .    Not vegetarian,  No prior b12 check eats  fruits vegetables and salads daily    3) CAD:  taking carvedilol and plavix  and atorvastatin.  Denies chest pain    Outpatient Medications Prior to Visit  Medication Sig  Dispense Refill   blood glucose meter kit and supplies KIT Use as directed to check blood sugar up to four times daily. 1 each 0   glucose blood (RIGHTEST GS550 BLOOD GLUCOSE) test strip Use as directed to check blood sugar up to four times daily. 100 strip 0   losartan (COZAAR) 100 MG tablet TAKE 1 TABLET(100 MG) BY MOUTH EVERY DAY 90 tablet 1   Multiple Vitamin (MULTIVITAMIN) tablet Take 1 tablet by mouth daily. 30 tablet 1   Rightest GL300 Lancets MISC Use as directed to check blood sugar up to four times daily. 100 each 0   atorvastatin (LIPITOR) 20 MG tablet TAKE 1 TABLET(20 MG) BY MOUTH EVERY DAY 90 tablet 3   carvedilol (COREG) 3.125 MG tablet TAKE 1 TABLET(3.125 MG) BY MOUTH TWICE DAILY WITH A MEAL 180 tablet 1   clopidogrel (PLAVIX) 75 MG tablet TAKE 1 TABLET(75 MG) BY MOUTH EVERY DAY 90 tablet 1   glipiZIDE (GLUCOTROL) 5 MG tablet TAKE 1 TABLET(5 MG) BY MOUTH TWICE DAILY BEFORE A MEAL 28 tablet 0   traZODone (DESYREL) 50 MG tablet Take 1 tablet (50 mg total) by mouth at bedtime as needed for sleep. 30 tablet 0   Magnesium Oxide 400 MG CAPS Take 1 capsule (400 mg total) by mouth daily. (Patient not taking: Reported on 12/07/2022) 30 capsule  1   Insulin Glargine (BASAGLAR KWIKPEN) 100 UNIT/ML Inject 25 Units into the skin once daily. (Patient not taking: Reported on 12/07/2022) 15 mL 0   Insulin Pen Needle 32G X 4 MM MISC USE AS DIRECTED FOR INSULIN. (Patient not taking: Reported on 12/07/2022) 100 each 0   phosphorus (K PHOS NEUTRAL) 155-852-130 MG tablet Take 2 tablets (500 mg total) by mouth daily. (Patient not taking: Reported on 12/07/2022) 14 tablet 0   thiamine (VITAMIN B-1) 100 MG tablet Take 1 tablet (100 mg total) by mouth once daily. (Patient not taking: Reported on 12/07/2022) 30 tablet 1   No facility-administered medications prior to visit.    Review of Systems;  Patient denies headache, fevers, malaise, unintentional weight loss, skin rash, eye pain, sinus congestion and sinus  pain, sore throat, dysphagia,  hemoptysis , cough, dyspnea, wheezing, chest pain, palpitations, orthopnea, edema, abdominal pain, nausea, melena, diarrhea, constipation, flank pain, dysuria, hematuria, urinary  Frequency, nocturia, numbness, tingling, seizures,  Focal weakness, Loss of consciousness,  Tremor, insomnia, depression, anxiety, and suicidal ideation.      Objective:  BP (!) 162/84   Pulse 74   Temp 98 F (36.7 C) (Oral)   Ht 5\' 5"  (1.651 m)   Wt 152 lb 3.2 oz (69 kg)   SpO2 95%   BMI 25.33 kg/m   BP Readings from Last 3 Encounters:  12/07/22 (!) 162/84  11/04/21 (!) 176/84  09/16/21 (!) 163/84    Wt Readings from Last 3 Encounters:  12/07/22 152 lb 3.2 oz (69 kg)  11/04/21 146 lb 3.2 oz (66.3 kg)  09/12/21 159 lb (72.1 kg)    Physical Exam Vitals reviewed.  Constitutional:      General: She is not in acute distress.    Appearance: Normal appearance. She is normal weight. She is not ill-appearing, toxic-appearing or diaphoretic.  HENT:     Head: Normocephalic.  Eyes:     General: No scleral icterus.       Right eye: No discharge.        Left eye: No discharge.     Conjunctiva/sclera: Conjunctivae normal.  Cardiovascular:     Rate and Rhythm: Normal rate and regular rhythm.     Heart sounds: Normal heart sounds.  Pulmonary:     Effort: Pulmonary effort is normal. No respiratory distress.     Breath sounds: Normal breath sounds.  Musculoskeletal:        General: Normal range of motion.  Skin:    General: Skin is warm and dry.  Neurological:     General: No focal deficit present.     Mental Status: She is alert and oriented to person, place, and time. Mental status is at baseline.  Psychiatric:        Mood and Affect: Mood normal.        Behavior: Behavior normal.        Thought Content: Thought content normal.        Judgment: Judgment normal.     Lab Results  Component Value Date   HGBA1C 6.7 (H) 12/07/2022   HGBA1C 10.0 (H) 11/04/2021    HGBA1C 11.4 (A) 04/23/2021    Lab Results  Component Value Date   CREATININE 0.97 12/07/2022   CREATININE 0.80 11/04/2021   CREATININE 0.58 09/16/2021    Lab Results  Component Value Date   WBC 6.9 09/12/2021   HGB 13.1 09/12/2021   HCT 39.9 09/12/2021   PLT 225 09/12/2021   GLUCOSE 111 (H)  12/07/2022   CHOL 177 12/07/2022   TRIG 125.0 12/07/2022   HDL 49.80 12/07/2022   LDLDIRECT 114.0 12/07/2022   LDLCALC 102 (H) 12/07/2022   ALT 9 12/07/2022   AST 21 12/07/2022   NA 139 12/07/2022   K 4.0 12/07/2022   CL 105 12/07/2022   CREATININE 0.97 12/07/2022   BUN 15 12/07/2022   CO2 25 12/07/2022   TSH 0.87 12/07/2012   HGBA1C 6.7 (H) 12/07/2022   MICROALBUR <0.7 12/07/2022    No results found.  Assessment & Plan:  .Type 2 diabetes mellitus with hyperlipidemia (HCC) Assessment & Plan: Well controlled on glipizide alone /  no proteinuria.  Continue statin at higher dose needed for goal  LDL of 70 , continue losartan 100 mg for BP control  Lab Results  Component Value Date   HGBA1C 6.7 (H) 12/07/2022   Lab Results  Component Value Date   CREATININE 0.97 12/07/2022   Lab Results  Component Value Date   MICROALBUR <0.7 12/07/2022   MICROALBUR 1.8 11/04/2021      Orders: -     Hemoglobin A1c -     Comprehensive metabolic panel -     Microalbumin / creatinine urine ratio -     Atorvastatin Calcium; Take 1 tablet (40 mg total) by mouth daily. TAKE 1 TABLET(20 MG) BY MOUTH EVERY DAY  Dispense: 90 tablet; Refill: 1  Essential hypertension -     Comprehensive metabolic panel -     Microalbumin / creatinine urine ratio  Hyperlipidemia with target LDL less than 70 -     Lipid panel -     LDL cholesterol, direct  Numbness and tingling in both hands -     B12 and Folate Panel  Hypertension, unspecified type Assessment & Plan: Advised to rCONTINUE coreg and cozaar and add 5 mg amlodipine   Lab Results  Component Value Date   CREATININE 0.97 12/07/2022   Lab  Results  Component Value Date   NA 139 12/07/2022   K 4.0 12/07/2022   CL 105 12/07/2022   CO2 25 12/07/2022      Alcoholism in recovery [facility]) Assessment & Plan: She has followed through with her plan for sobriety, which includes total abstinence  an dhas been abstinent for 16 months ; her family is supportive of her efforts. ..  She has declined to use AA   Coronary artery disease involving native coronary artery of native heart without angina pectoris Assessment & Plan: She is asymptomatic on  statin therapy beta blocker, and plavix . Marland Kitchen She is not smoking. Lab Results  Component Value Date   CHOL 177 12/07/2022   HDL 49.80 12/07/2022   LDLCALC 102 (H) 12/07/2022   LDLDIRECT 114.0 12/07/2022   TRIG 125.0 12/07/2022   CHOLHDL 4 12/07/2022   Lab Results  Component Value Date   ALT 9 12/07/2022   AST 21 12/07/2022   ALKPHOS 51 12/07/2022   BILITOT 0.5 12/07/2022      Numbness and tingling of upper and lower extremities of both sides Assessment & Plan: Carmelia Roller secondary to diabetes given normal B12/folate  Last vitamin B12 and Folate Lab Results  Component Value Date   VITAMINB12 560 12/07/2022   FOLATE >23.8 12/07/2022      Type 2 diabetes mellitus with diabetic neuropathy, without long-term current use of insulin (HCC) Assessment & Plan: Symptoms are subjective and  intermittent . Foot exam is normal.   Diabetes is currently controlled with sulfonylurea.  Other orders -     Clopidogrel Bisulfate; TAKE 1 TABLET(75 MG) BY MOUTH EVERY DAY  Dispense: 90 tablet; Refill: 1 -     traZODone HCl; Take 1.5 tablets (75 mg total) by mouth at bedtime as needed for sleep.  Dispense: 30 tablet; Refill: 5 -     Carvedilol; TAKE 1 TABLET(3.125 MG) BY MOUTH TWICE DAILY WITH A MEAL  Dispense: 60 tablet; Refill: 2 -     amLODIPine Besylate; Take 1 tablet (5 mg total) by mouth daily.  Dispense: 90 tablet; Refill: 1     I provided 33  minutes on the day of this visit in   face-to-face time , reviewing patient's last visit with me, patient's  most recent visit with cardiology,  previous surgical and non surgical procedures, previous  labs and imaging studies, counseling on currently addressed issues,  and post visit ordering to diagnostics and therapeutics .   Follow-up: Return in about 3 months (around 03/09/2023) for physical.   Sherlene Shams, MD

## 2022-12-08 LAB — HEMOGLOBIN A1C: Hgb A1c MFr Bld: 6.7 % — ABNORMAL HIGH (ref 4.6–6.5)

## 2022-12-08 LAB — LIPID PANEL
Cholesterol: 177 mg/dL (ref 0–200)
HDL: 49.8 mg/dL (ref >39.00)
LDL Cholesterol: 102 mg/dL — ABNORMAL HIGH (ref 0–99)
NonHDL: 127.13
Total CHOL/HDL Ratio: 4
Triglycerides: 125 mg/dL (ref 0.0–149.0)
VLDL: 25 mg/dL (ref 0.0–40.0)

## 2022-12-08 LAB — B12 AND FOLATE PANEL
Folate: >23.8 ng/mL (ref >5.9)
Vitamin B-12: 560 pg/mL (ref 211–911)

## 2022-12-08 LAB — COMPREHENSIVE METABOLIC PANEL
ALT: 9 U/L (ref 0–35)
AST: 21 U/L (ref 0–37)
Albumin: 4.6 g/dL (ref 3.5–5.2)
Alkaline Phosphatase: 51 U/L (ref 39–117)
BUN: 15 mg/dL (ref 6–23)
CO2: 25 mEq/L (ref 19–32)
Calcium: 10.3 mg/dL (ref 8.4–10.5)
Chloride: 105 mEq/L (ref 96–112)
Creatinine, Ser: 0.97 mg/dL (ref 0.40–1.20)
GFR: 62.1 mL/min (ref >60.00)
Glucose, Bld: 111 mg/dL — ABNORMAL HIGH (ref 70–99)
Potassium: 4 mEq/L (ref 3.5–5.1)
Sodium: 139 mEq/L (ref 135–145)
Total Bilirubin: 0.5 mg/dL (ref 0.2–1.2)
Total Protein: 7.2 g/dL (ref 6.0–8.3)

## 2022-12-08 LAB — LDL CHOLESTEROL, DIRECT: Direct LDL: 114 mg/dL

## 2022-12-08 LAB — MICROALBUMIN / CREATININE URINE RATIO
Creatinine,U: 37.4 mg/dL
Microalb Creat Ratio: 1.9 mg/g (ref 0.0–30.0)
Microalb, Ur: <0.7 mg/dL (ref 0.0–1.9)

## 2022-12-09 DIAGNOSIS — R200 Anesthesia of skin: Secondary | ICD-10-CM | POA: Insufficient documentation

## 2022-12-09 DIAGNOSIS — E1140 Type 2 diabetes mellitus with diabetic neuropathy, unspecified: Secondary | ICD-10-CM | POA: Insufficient documentation

## 2022-12-09 MED ORDER — ATORVASTATIN CALCIUM 40 MG PO TABS
40.0000 mg | ORAL_TABLET | Freq: Every day | ORAL | 1 refills | Status: DC
Start: 2022-12-09 — End: 2023-08-23

## 2022-12-09 MED ORDER — AMLODIPINE BESYLATE 5 MG PO TABS: 5.0000 mg | ORAL_TABLET | Freq: Every day | ORAL | 1 refills | Status: DC

## 2022-12-09 NOTE — Assessment & Plan Note (Signed)
Advised to rCONTINUE coreg and cozaar and add 5 mg amlodipine   Lab Results  Component Value Date   CREATININE 0.97 12/07/2022   Lab Results  Component Value Date   NA 139 12/07/2022   K 4.0 12/07/2022   CL 105 12/07/2022   CO2 25 12/07/2022

## 2022-12-09 NOTE — Assessment & Plan Note (Signed)
Liekly secondary to diabetes given normal B12/folate  Last vitamin B12 and Folate Lab Results  Component Value Date   VITAMINB12 560 12/07/2022   FOLATE >23.8 12/07/2022

## 2022-12-09 NOTE — Assessment & Plan Note (Addendum)
Well controlled on glipizide alone /  no proteinuria.  Continue statin at higher dose needed for goal  LDL of 70 , continue losartan 100 mg for BP control  Lab Results  Component Value Date   HGBA1C 6.7 (H) 12/07/2022   Lab Results  Component Value Date   CREATININE 0.97 12/07/2022   Lab Results  Component Value Date   MICROALBUR <0.7 12/07/2022   MICROALBUR 1.8 11/04/2021

## 2022-12-09 NOTE — Assessment & Plan Note (Addendum)
Symptoms are subjective and  intermittent . Foot exam is normal.   Diabetes is currently controlled with sulfonylurea.

## 2022-12-09 NOTE — Assessment & Plan Note (Signed)
She has followed through with her plan for sobriety, which includes total abstinence  an dhas been abstinent for 16 months ; her family is supportive of her efforts. ..  She has declined to use AA

## 2022-12-09 NOTE — Assessment & Plan Note (Signed)
She is asymptomatic on  statin therapy beta blocker, and plavix . Sharon Melton She is not smoking. Lab Results  Component Value Date   CHOL 177 12/07/2022   HDL 49.80 12/07/2022   LDLCALC 102 (H) 12/07/2022   LDLDIRECT 114.0 12/07/2022   TRIG 125.0 12/07/2022   CHOLHDL 4 12/07/2022   Lab Results  Component Value Date   ALT 9 12/07/2022   AST 21 12/07/2022   ALKPHOS 51 12/07/2022   BILITOT 0.5 12/07/2022

## 2022-12-28 ENCOUNTER — Other Ambulatory Visit: Payer: Self-pay | Admitting: Internal Medicine

## 2023-05-27 ENCOUNTER — Other Ambulatory Visit: Payer: Self-pay | Admitting: Internal Medicine

## 2023-05-30 ENCOUNTER — Other Ambulatory Visit: Payer: Self-pay | Admitting: Internal Medicine

## 2023-05-30 ENCOUNTER — Other Ambulatory Visit: Payer: Self-pay

## 2023-06-01 MED ORDER — GLIPIZIDE 5 MG PO TABS: ORAL_TABLET | ORAL | 1 refills | Status: DC

## 2023-06-02 ENCOUNTER — Other Ambulatory Visit: Payer: Self-pay

## 2023-06-02 MED ORDER — GLIPIZIDE 5 MG PO TABS: ORAL_TABLET | ORAL | 1 refills | Status: DC

## 2023-06-07 NOTE — Progress Notes (Signed)
Attempted to call Patient-no answer or voicemail 

## 2023-07-04 ENCOUNTER — Other Ambulatory Visit: Payer: Self-pay

## 2023-07-04 MED ORDER — TRAZODONE HCL 50 MG PO TABS: ORAL_TABLET | ORAL | 2 refills | Status: DC

## 2023-08-23 ENCOUNTER — Other Ambulatory Visit: Payer: Self-pay

## 2023-08-23 DIAGNOSIS — E1169 Type 2 diabetes mellitus with other specified complication: Secondary | ICD-10-CM

## 2023-08-23 MED ORDER — ATORVASTATIN CALCIUM 40 MG PO TABS
40.0000 mg | ORAL_TABLET | Freq: Every day | ORAL | 0 refills | Status: DC
Start: 2023-08-23 — End: 2023-09-27

## 2023-08-28 ENCOUNTER — Other Ambulatory Visit: Payer: Self-pay | Admitting: Internal Medicine

## 2023-09-18 ENCOUNTER — Ambulatory Visit: Payer: Self-pay | Admitting: Internal Medicine

## 2023-09-27 ENCOUNTER — Ambulatory Visit (INDEPENDENT_AMBULATORY_CARE_PROVIDER_SITE_OTHER): Payer: Self-pay | Admitting: Internal Medicine

## 2023-09-27 ENCOUNTER — Encounter: Payer: Self-pay | Admitting: Internal Medicine

## 2023-09-27 VITALS — BP 136/70 | HR 60 | Temp 98.1°F | Ht 65.0 in | Wt 150.0 lb

## 2023-09-27 DIAGNOSIS — I10 Essential (primary) hypertension: Secondary | ICD-10-CM

## 2023-09-27 DIAGNOSIS — D509 Iron deficiency anemia, unspecified: Secondary | ICD-10-CM

## 2023-09-27 DIAGNOSIS — E1140 Type 2 diabetes mellitus with diabetic neuropathy, unspecified: Secondary | ICD-10-CM

## 2023-09-27 DIAGNOSIS — F1021 Alcohol dependence, in remission: Secondary | ICD-10-CM

## 2023-09-27 DIAGNOSIS — I2510 Atherosclerotic heart disease of native coronary artery without angina pectoris: Secondary | ICD-10-CM

## 2023-09-27 DIAGNOSIS — E1169 Type 2 diabetes mellitus with other specified complication: Secondary | ICD-10-CM

## 2023-09-27 DIAGNOSIS — E785 Hyperlipidemia, unspecified: Secondary | ICD-10-CM

## 2023-09-27 DIAGNOSIS — R5383 Other fatigue: Secondary | ICD-10-CM

## 2023-09-27 LAB — POCT GLYCOSYLATED HEMOGLOBIN (HGB A1C): Hemoglobin A1C: 6.1 % — AB (ref 4.0–5.6)

## 2023-09-27 MED ORDER — CLOPIDOGREL BISULFATE 75 MG PO TABS: ORAL_TABLET | ORAL | 1 refills | Status: DC

## 2023-09-27 MED ORDER — GLIPIZIDE 5 MG PO TABS: ORAL_TABLET | ORAL | 1 refills | Status: AC

## 2023-09-27 MED ORDER — CARVEDILOL 3.125 MG PO TABS: ORAL_TABLET | ORAL | 0 refills | Status: DC

## 2023-09-27 MED ORDER — ATORVASTATIN CALCIUM 40 MG PO TABS: 40.0000 mg | ORAL_TABLET | Freq: Every day | ORAL | 1 refills | Status: DC

## 2023-09-27 MED ORDER — TRAZODONE HCL 50 MG PO TABS: ORAL_TABLET | ORAL | 11 refills | Status: AC

## 2023-09-27 NOTE — Patient Instructions (Signed)
 Good to see you!   Your diabetes is under excellent control.  I have refilled all of your medications .  You may need to resume the losartan  if your urine tests positive for protein

## 2023-09-27 NOTE — Progress Notes (Signed)
 Subjective:  Patient ID: Sharon Melton, female    DOB: 04/04/1961  Age: 63 y.o. MRN: [id number]  CC: The primary encounter diagnosis was Type 2 diabetes mellitus with hyperlipidemia (HCC). Diagnoses of Essential hypertension, Hyperlipidemia with target LDL less than 70, Other fatigue, Microcytic anemia, Alcoholism in recovery (HCC), Type 2 diabetes mellitus with diabetic neuropathy, without long-term current use of insulin  (HCC), and Coronary artery disease involving native heart without angina pectoris, unspecified vessel or lesion type were also pertinent to this visit.   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  She has been checking  blood sugars less than once daily at variable times, usually only if she feels she may be having a hypoglycemic event. .  BS have been under 130 fasting and < 150 post prandially.  She denies any recent hypoglyemic events and is taking her medications as directed. Following a carbohydrate modified diet 6 days per week. Denies numbness, burning and tingling of extremities. Appetite is good.    CAD:  she denies any recent history of  chest pain   Hypertension: patient checks blood pressure twice weekly at home.  Readings have been for the most part <130/80 at rest . Patient is following a reduced salt diet most days and is taking medications as prescribed but not taking losartan  because she was overdu for follow up.   Alcohol abuse history:  she reports that she has had  2 years of sobriety     Outpatient Medications Prior to Visit  Medication Sig Dispense Refill   amLODipine  (NORVASC ) 5 MG tablet TAKE 1 TABLET(5 MG) BY MOUTH DAILY 90  tablet 1   blood glucose meter kit and supplies KIT Use as directed to check blood sugar up to four times daily. 1 each 0   glucose blood (RIGHTEST GS550 BLOOD GLUCOSE) test strip Use as directed to check blood sugar up to four times daily. 100 strip 0   Magnesium  Oxide 400 MG CAPS Take 1 capsule (400 mg total) by mouth daily. 30 capsule 1   Multiple Vitamin (MULTIVITAMIN) tablet Take 1 tablet by mouth daily. 30 tablet 1   Rightest GL300 Lancets MISC Use as directed to check blood sugar up to four times daily. 100 each 0   atorvastatin  (LIPITOR) 40 MG tablet Take 1 tablet (40 mg total) by mouth daily. TAKE 1 TABLET(20 MG) BY MOUTH EVERY DAY 90 tablet 0   carvedilol  (COREG ) 3.125 MG tablet TAKE 1 TABLET(3.125 MG) BY MOUTH TWICE DAILY WITH A MEAL 60 tablet 0   clopidogrel  (PLAVIX ) 75 MG tablet TAKE 1 TABLET(75 MG) BY MOUTH EVERY DAY 90 tablet 1   glipiZIDE  (GLUCOTROL ) 5 MG tablet TAKE 1 TABLET(5 MG) BY MOUTH TWICE DAILY BEFORE A MEAL 180 tablet 1   losartan  (COZAAR ) 100 MG tablet TAKE 1 TABLET(100 MG) BY MOUTH EVERY DAY 90 tablet 0   traZODone  (DESYREL ) 50 MG tablet TAKE 1 AND 1/2 TABLETS(75 MG) BY MOUTH AT BEDTIME AS NEEDED FOR SLEEP 45 tablet 2   No facility-administered medications prior to visit.    Review of Systems;  Patient denies headache, fevers, malaise, unintentional  weight loss, skin rash, eye pain, sinus congestion and sinus pain, sore throat, dysphagia,  hemoptysis , cough, dyspnea, wheezing, chest pain, palpitations, orthopnea, edema, abdominal pain, nausea, melena, diarrhea, constipation, flank pain, dysuria, hematuria, urinary  Frequency, nocturia, numbness, tingling, seizures,  Focal weakness, Loss of consciousness,  Tremor, insomnia, depression, anxiety, and suicidal ideation.      Objective:  BP 136/70   Pulse 60   Temp 98.1 F (36.7 C) (Oral)   Ht 5\' 5"  (1.651 m)   Wt 150 lb (68 kg)   SpO2 96%   BMI 24.96 kg/m   BP Readings from Last 3 Encounters:  09/27/23 136/70   12/07/22 (!) 162/84  11/04/21 (!) 176/84    Wt Readings from Last 3 Encounters:  09/27/23 150 lb (68 kg)  12/07/22 152 lb 3.2 oz (69 kg)  11/04/21 146 lb 3.2 oz (66.3 kg)    Physical Exam Vitals reviewed.  Constitutional:      General: She is not in acute distress.    Appearance: Normal appearance. She is normal weight. She is not ill-appearing, toxic-appearing or diaphoretic.  HENT:     Head: Normocephalic.  Eyes:     General: No scleral icterus.       Right eye: No discharge.        Left eye: No discharge.     Conjunctiva/sclera: Conjunctivae normal.  Cardiovascular:     Rate and Rhythm: Normal rate and regular rhythm.     Heart sounds: Normal heart sounds.  Pulmonary:     Effort: Pulmonary effort is normal. No respiratory distress.     Breath sounds: Normal breath sounds.  Musculoskeletal:        General: Normal range of motion.  Skin:    General: Skin is warm and dry.  Neurological:     General: No focal deficit present.     Mental Status: She is alert and oriented to person, place, and time. Mental status is at baseline.  Psychiatric:        Mood and Affect: Mood normal.        Behavior: Behavior normal.        Thought Content: Thought content normal.        Judgment: Judgment normal.     Lab Results  Component Value Date   HGBA1C 6.1 (A) 09/27/2023   HGBA1C 6.7 (H) 12/07/2022   HGBA1C 10.0 (H) 11/04/2021    Lab Results  Component Value Date   CREATININE 1.02 09/27/2023   CREATININE 0.97 12/07/2022   CREATININE 0.80 11/04/2021    Lab Results  Component Value Date   WBC 7.9 09/27/2023   HGB 11.8 (L) 09/27/2023   HCT 36.2 09/27/2023   PLT 299.0 09/27/2023   GLUCOSE 69 (L) 09/27/2023   CHOL 145 09/27/2023   TRIG 143.0 09/27/2023   HDL 44.20 09/27/2023   LDLDIRECT 85.0 09/27/2023   LDLCALC 72 09/27/2023   ALT 7 09/27/2023   AST 17 09/27/2023   NA 139 09/27/2023   K 3.9 09/27/2023   CL 107 09/27/2023   CREATININE 1.02 09/27/2023   BUN 19  09/27/2023   CO2 23 09/27/2023   TSH 1.06 09/27/2023   HGBA1C 6.1 (A) 09/27/2023   MICROALBUR <0.7 09/27/2023    No results found.  Assessment & Plan:  .Type 2 diabetes mellitus with hyperlipidemia (HCC) -     POCT glycosylated hemoglobin (Hb A1C) -     Comprehensive metabolic panel with GFR -     Microalbumin / creatinine  urine ratio -     Atorvastatin  Calcium ; Take 1 tablet (40 mg total) by mouth daily. TAKE 1 TABLET(20 MG) BY MOUTH EVERY DAY  Dispense: 90 tablet; Refill: 1  Essential hypertension Assessment & Plan: She reports compliance with medication regimen. Renal function, electrolytes and screen for proteinuria are all normal  Lab Results  Component Value Date   CREATININE 1.02 09/27/2023   Lab Results  Component Value Date   NA 139 09/27/2023   K 3.9 09/27/2023   CL 107 09/27/2023   CO2 23 09/27/2023   Lab Results  Component Value Date   CREATININE 1.02 09/27/2023      Orders: -     Comprehensive metabolic panel with GFR -     Microalbumin / creatinine urine ratio  Hyperlipidemia with target LDL less than 70 -     Lipid panel -     LDL cholesterol, direct  Other fatigue -     CBC with Differential/Platelet -     TSH  Microcytic anemia Assessment & Plan: Pateint asked to return for iron studies and FOBT..  she has no prior colonoscopy  Lab Results  Component Value Date   WBC 7.9 09/27/2023   HGB 11.8 (L) 09/27/2023   HCT 36.2 09/27/2023   MCV 73.1 (L) 09/27/2023   PLT 299.0 09/27/2023     Orders: -     IBC + Ferritin; Future -     Fecal occult blood, imunochemical; Future  Alcoholism in recovery [facility]) Assessment & Plan: She reports  2 years of sobriety   Type 2 diabetes mellitus with diabetic neuropathy, without long-term current use of insulin  (HCC) Assessment & Plan: Symptoms are subjective and  intermittent . Foot exam is normal.   Diabetes is currently controlled with sulfonylurea.   Continue statin and resume ARB    Coronary  artery disease involving native heart without angina pectoris, unspecified vessel or lesion type Assessment & Plan: She is asymptomatic on  statin therapy , beta blocker, and plavix  . Aaron Aas She is not smoking.  Resume ARB.  SGLT 2 inhibitor considered but cost prohibitive  due to uninsured status  will repeat pharmacy consult to look for avenues of supply from pharma  Lab Results  Component Value Date   CHOL 145 09/27/2023   HDL 44.20 09/27/2023   LDLCALC 72 09/27/2023   LDLDIRECT 85.0 09/27/2023   TRIG 143.0 09/27/2023   CHOLHDL 3 09/27/2023   Lab Results  Component Value Date   ALT 7 09/27/2023   AST 17 09/27/2023   ALKPHOS 50 09/27/2023   BILITOT 0.3 09/27/2023      Other orders -     Clopidogrel  Bisulfate; TAKE 1 TABLET(75 MG) BY MOUTH EVERY DAY  Dispense: 90 tablet; Refill: 1 -     glipiZIDE ; TAKE 1 TABLET(5 MG) BY MOUTH TWICE DAILY BEFORE A MEAL  Dispense: 180 tablet; Refill: 1 -     Carvedilol ; TAKE 1 TABLET(3.125 MG) BY MOUTH TWICE DAILY WITH A MEAL  Dispense: 60 tablet; Refill: 0 -     traZODone  HCl; TAKE 1 AND 1/2 TABLETS(75 MG) BY MOUTH AT BEDTIME AS NEEDED FOR SLEEP  Dispense: 45 tablet; Refill: 11 -     Losartan  Potassium; Take 1 tablet (50 mg total) by mouth at bedtime.  Dispense: 90 tablet; Refill: 0     I spent 34 minutes on the day of this face to face encounter reviewing patient's  most recent visit with cardiology,  prior relevant surgical and non  surgical procedures, recent  labs and imaging studies, counseling on weight management,  reviewing the assessment and plan with patient, and post visit ordering and reviewing of  diagnostics and therapeutics with patient  .   Follow-up: Return in about 6 months (around 03/29/2024) for follow up diabetes, physical.   Thersia Flax, MD

## 2023-09-28 LAB — LIPID PANEL
Cholesterol: 145 mg/dL (ref 0–200)
HDL: 44.2 mg/dL (ref >39.00)
LDL Cholesterol: 72 mg/dL (ref 0–99)
NonHDL: 100.8
Total CHOL/HDL Ratio: 3
Triglycerides: 143 mg/dL (ref 0.0–149.0)
VLDL: 28.6 mg/dL (ref 0.0–40.0)

## 2023-09-28 LAB — COMPREHENSIVE METABOLIC PANEL WITH GFR
ALT: 7 U/L (ref 0–35)
AST: 17 U/L (ref 0–37)
Albumin: 4.5 g/dL (ref 3.5–5.2)
Alkaline Phosphatase: 50 U/L (ref 39–117)
BUN: 19 mg/dL (ref 6–23)
CO2: 23 meq/L (ref 19–32)
Calcium: 9.5 mg/dL (ref 8.4–10.5)
Chloride: 107 meq/L (ref 96–112)
Creatinine, Ser: 1.02 mg/dL (ref 0.40–1.20)
GFR: 58.14 mL/min — ABNORMAL LOW (ref >60.00)
Glucose, Bld: 69 mg/dL — ABNORMAL LOW (ref 70–99)
Potassium: 3.9 meq/L (ref 3.5–5.1)
Sodium: 139 meq/L (ref 135–145)
Total Bilirubin: 0.3 mg/dL (ref 0.2–1.2)
Total Protein: 7.2 g/dL (ref 6.0–8.3)

## 2023-09-28 LAB — CBC WITH DIFFERENTIAL/PLATELET
Basophils Absolute: 0.1 10*3/uL (ref 0.0–0.1)
Basophils Relative: 0.9 % (ref 0.0–3.0)
Eosinophils Absolute: 0.8 10*3/uL — ABNORMAL HIGH (ref 0.0–0.7)
Eosinophils Relative: 10.1 % — ABNORMAL HIGH (ref 0.0–5.0)
HCT: 36.2 % (ref 36.0–46.0)
Hemoglobin: 11.8 g/dL — ABNORMAL LOW (ref 12.0–15.0)
Lymphocytes Relative: 30.3 % (ref 12.0–46.0)
Lymphs Abs: 2.4 10*3/uL (ref 0.7–4.0)
MCHC: 32.5 g/dL (ref 30.0–36.0)
MCV: 73.1 fl — ABNORMAL LOW (ref 78.0–100.0)
Monocytes Absolute: 0.6 10*3/uL (ref 0.1–1.0)
Monocytes Relative: 7.4 % (ref 3.0–12.0)
Neutro Abs: 4 10*3/uL (ref 1.4–7.7)
Neutrophils Relative %: 51.3 % (ref 43.0–77.0)
Platelets: 299 10*3/uL (ref 150.0–400.0)
RBC: 4.96 Mil/uL (ref 3.87–5.11)
RDW: 14.9 % (ref 11.5–15.5)
WBC: 7.9 10*3/uL (ref 4.0–10.5)

## 2023-09-28 LAB — TSH: TSH: 1.06 u[IU]/mL (ref 0.35–5.50)

## 2023-09-28 LAB — MICROALBUMIN / CREATININE URINE RATIO
Creatinine,U: 34.5 mg/dL
Microalb Creat Ratio: UNDETERMINED mg/g (ref 0.0–30.0)
Microalb, Ur: <0.7 mg/dL

## 2023-09-28 LAB — LDL CHOLESTEROL, DIRECT: Direct LDL: 85 mg/dL

## 2023-09-29 ENCOUNTER — Encounter: Payer: Self-pay | Admitting: Internal Medicine

## 2023-09-29 DIAGNOSIS — D509 Iron deficiency anemia, unspecified: Secondary | ICD-10-CM | POA: Insufficient documentation

## 2023-09-29 MED ORDER — LOSARTAN POTASSIUM 50 MG PO TABS: 50.0000 mg | ORAL_TABLET | Freq: Every day | ORAL | 0 refills | Status: DC

## 2023-09-29 NOTE — Assessment & Plan Note (Signed)
 She reports  2 years of sobriety

## 2023-09-29 NOTE — Assessment & Plan Note (Signed)
 Pateint asked to return for iron studies and FOBT..  she has no prior colonoscopy  Lab Results  Component Value Date   WBC 7.9 09/27/2023   HGB 11.8 (L) 09/27/2023   HCT 36.2 09/27/2023   MCV 73.1 (L) 09/27/2023   PLT 299.0 09/27/2023

## 2023-09-29 NOTE — Assessment & Plan Note (Signed)
 Symptoms are subjective and  intermittent . Foot exam is normal.   Diabetes is currently controlled with sulfonylurea.   Continue statin and resume ARB

## 2023-09-29 NOTE — Assessment & Plan Note (Signed)
 She is asymptomatic on  statin therapy , beta blocker, and plavix  . . She is not smoking.  Resume ARB.  SGLT 2 inhibitor considered but cost prohibitive  due to uninsured status  will repeat pharmacy consult to look for avenues of supply from pharma  Lab Results  Component Value Date   CHOL 145 09/27/2023   HDL 44.20 09/27/2023   LDLCALC 72 09/27/2023   LDLDIRECT 85.0 09/27/2023   TRIG 143.0 09/27/2023   CHOLHDL 3 09/27/2023   Lab Results  Component Value Date   ALT 7 09/27/2023   AST 17 09/27/2023   ALKPHOS 50 09/27/2023   BILITOT 0.3 09/27/2023

## 2023-09-29 NOTE — Assessment & Plan Note (Signed)
 She reports compliance with medication regimen. Renal function, electrolytes and screen for proteinuria are all normal  Lab Results  Component Value Date   CREATININE 1.02 09/27/2023   Lab Results  Component Value Date   NA 139 09/27/2023   K 3.9 09/27/2023   CL 107 09/27/2023   CO2 23 09/27/2023   Lab Results  Component Value Date   CREATININE 1.02 09/27/2023

## 2023-10-02 ENCOUNTER — Other Ambulatory Visit (INDEPENDENT_AMBULATORY_CARE_PROVIDER_SITE_OTHER): Payer: Self-pay

## 2023-10-02 DIAGNOSIS — D509 Iron deficiency anemia, unspecified: Secondary | ICD-10-CM

## 2023-10-03 LAB — IBC + FERRITIN
Ferritin: 13.7 ng/mL (ref 10.0–291.0)
Iron: 58 ug/dL (ref 42–145)
Saturation Ratios: 15.1 % — ABNORMAL LOW (ref 20.0–50.0)
TIBC: 383.6 ug/dL (ref 250.0–450.0)
Transferrin: 274 mg/dL (ref 212.0–360.0)

## 2023-10-04 ENCOUNTER — Ambulatory Visit: Payer: Self-pay | Admitting: Internal Medicine

## 2023-10-04 ENCOUNTER — Other Ambulatory Visit: Payer: Self-pay

## 2023-10-04 MED ORDER — IRON (FERROUS SULFATE) 325 (65 FE) MG PO TABS: 325.0000 mg | ORAL_TABLET | Freq: Every day | ORAL | 2 refills | Status: DC

## 2023-10-04 MED ORDER — CARVEDILOL 3.125 MG PO TABS: ORAL_TABLET | ORAL | 1 refills | Status: DC

## 2023-10-04 NOTE — Addendum Note (Signed)
 Addended by: Thersia Flax on: 10/04/2023 12:46 PM   Modules accepted: Orders

## 2023-11-24 ENCOUNTER — Other Ambulatory Visit: Payer: Self-pay | Admitting: Internal Medicine

## 2023-11-24 DIAGNOSIS — E1169 Type 2 diabetes mellitus with other specified complication: Secondary | ICD-10-CM

## 2023-12-05 ENCOUNTER — Encounter: Payer: Self-pay | Admitting: Internal Medicine

## 2023-12-27 ENCOUNTER — Other Ambulatory Visit: Payer: Self-pay | Admitting: Internal Medicine

## 2023-12-28 ENCOUNTER — Other Ambulatory Visit: Payer: Self-pay | Admitting: Internal Medicine

## 2024-01-02 ENCOUNTER — Other Ambulatory Visit: Payer: Self-pay

## 2024-01-02 MED ORDER — CARVEDILOL 3.125 MG PO TABS: ORAL_TABLET | ORAL | 0 refills | Status: DC

## 2024-01-02 MED ORDER — AMLODIPINE BESYLATE 5 MG PO TABS: ORAL_TABLET | ORAL | 0 refills | Status: DC

## 2024-01-27 ENCOUNTER — Other Ambulatory Visit: Payer: Self-pay | Admitting: Internal Medicine

## 2024-04-02 ENCOUNTER — Other Ambulatory Visit: Payer: Self-pay | Admitting: Internal Medicine

## 2024-05-11 ENCOUNTER — Encounter: Payer: Self-pay | Admitting: Internal Medicine

## 2024-05-13 ENCOUNTER — Other Ambulatory Visit: Payer: Self-pay | Admitting: Internal Medicine

## 2024-05-13 MED ORDER — AMLODIPINE BESYLATE 5 MG PO TABS: ORAL_TABLET | ORAL | 0 refills | Status: DC

## 2024-05-13 NOTE — Telephone Encounter (Unsigned)
 Copied from CRM #[id number]. Topic: Clinical - Medication Refill >> May 13, 2024  9:57 AM Charolett L wrote: Medication: amLODipine  (NORVASC ) 5 MG tablet  Has the patient contacted their pharmacy? Yes (Agent: If no, request that the patient contact the pharmacy for the refill. If patient does not wish to contact the pharmacy document the reason why and proceed with request.) (Agent: If yes, when and what did the pharmacy advise?)  This is the patient's preferred pharmacy:  [id number] [place], [place] - [address] AT [facility] OF [address] [place] [address] [place] [address] Phone: [phone] Fax: [phone]   Is this the correct pharmacy for this prescription? Yes If no, delete pharmacy and type the correct one.   Has the prescription been filled recently? Yes  Is the patient out of the medication? Yes  Has the patient been seen for an appointment in the last year OR does the patient have an upcoming appointment? Yes  Can we respond through MyChart? Yes  Agent: Please be advised that Rx refills may take up to 3 business days. We ask that you follow-up with your pharmacy.

## 2024-06-19 ENCOUNTER — Ambulatory Visit: Admitting: Internal Medicine

## 2024-06-19 ENCOUNTER — Encounter: Payer: Self-pay | Admitting: Internal Medicine

## 2024-06-19 VITALS — BP 138/70 | HR 72 | Temp 98.2°F | Ht 65.0 in | Wt 156.4 lb

## 2024-06-19 DIAGNOSIS — Z23 Encounter for immunization: Secondary | ICD-10-CM | POA: Diagnosis not present

## 2024-06-19 DIAGNOSIS — I10 Essential (primary) hypertension: Secondary | ICD-10-CM | POA: Diagnosis not present

## 2024-06-19 DIAGNOSIS — I70213 Atherosclerosis of native arteries of extremities with intermittent claudication, bilateral legs: Secondary | ICD-10-CM

## 2024-06-19 DIAGNOSIS — E1169 Type 2 diabetes mellitus with other specified complication: Secondary | ICD-10-CM | POA: Diagnosis not present

## 2024-06-19 DIAGNOSIS — F1021 Alcohol dependence, in remission: Secondary | ICD-10-CM

## 2024-06-19 DIAGNOSIS — Z780 Asymptomatic menopausal state: Secondary | ICD-10-CM | POA: Diagnosis not present

## 2024-06-19 DIAGNOSIS — E1140 Type 2 diabetes mellitus with diabetic neuropathy, unspecified: Secondary | ICD-10-CM

## 2024-06-19 DIAGNOSIS — E785 Hyperlipidemia, unspecified: Secondary | ICD-10-CM | POA: Diagnosis not present

## 2024-06-19 DIAGNOSIS — Z1231 Encounter for screening mammogram for malignant neoplasm of breast: Secondary | ICD-10-CM

## 2024-06-19 DIAGNOSIS — Z1211 Encounter for screening for malignant neoplasm of colon: Secondary | ICD-10-CM

## 2024-06-19 NOTE — Assessment & Plan Note (Signed)
 She reports  3 years of sobriety

## 2024-06-19 NOTE — Assessment & Plan Note (Addendum)
 Not at goal.  Will increase amlodipine  to 10mg   and continue 50 mg losartan .  She has no proteinuria   Lab Results  Component Value Date   MICROALBUR 0.7 06/19/2024   MICROALBUR <0.7 09/27/2023

## 2024-06-19 NOTE — Progress Notes (Addendum)
 "  Subjective:  Patient ID: Sharon Melton, female    DOB: [birth year]  Age: 64 y.o. MRN: [id number]  CC: The primary encounter diagnosis was Encounter for screening mammogram for malignant neoplasm of breast. Diagnoses of Alcoholism in recovery (HCC), Type 2 diabetes mellitus with hyperlipidemia (HCC), Postmenopausal estrogen deficiency, Colon cancer screening, Need for pneumococcal 20-valent conjugate vaccination, Atherosclerosis of native artery of both lower extremities with intermittent claudication, Type 2 diabetes mellitus with diabetic neuropathy, without long-term current use of insulin  (HCC), Essential hypertension, and Hyperlipidemia with target LDL less than 70 were also pertinent to this visit.   HPI Sharon Melton presents for  Chief Complaint  Patient presents with   Medical Management of Chronic Issues    *follow up   Last seen May 2025 : lost to follow up due to lack of insurance   1) type 2 DM with neuropathy:  Sharon Melton  feels generally well, is  walking for exercise 3  times per week and checking blood sugars once daily at variable times.  BS have been under 130 fasting and < 150 post prandially.  Denies any recent hypoglyemic events.  Taking glipizide  5 mg  twice daily as directed. Following a carbohydrate modified diet 6 days per week. She has Has  numbness of feet that has improved over the past several years.     2) Alcoholism : she has been in recovery and will be celebrating 3 years of sobriety in May. Her family has been very supportive    3)hypertension:  home readings have been elevated on several occasions,  never below 130/90   Social:  she states that she is enjoying home schooling her 2 grandchildren  ages 85 and 24 . Sharon Melton being on the autism spectrum age 39 .  Getting along with husband.  Sleeping with trazodone   75 mg at bedtime .husband diagnosed with ADHD recently;  they are getting along better   Outpatient Medications Prior to Visit  Medication Sig  Dispense Refill   amLODipine  (NORVASC ) 5 MG tablet TAKE 1 TABLET(5 MG) BY MOUTH DAILY 37 tablet 0   atorvastatin  (LIPITOR) 40 MG tablet Take 1 tablet (40 mg total) by mouth daily. TAKE 1 TABLET(20 MG) BY MOUTH EVERY DAY 90 tablet 1   blood glucose meter kit and supplies KIT Use as directed to check blood sugar up to four times daily. 1 each 0   carvedilol  (COREG ) 3.125 MG tablet TAKE 1 TABLET(3.125 MG) BY MOUTH TWICE DAILY WITH A MEAL 180 tablet 0   clopidogrel  (PLAVIX ) 75 MG tablet TAKE 1 TABLET(75 MG) BY MOUTH EVERY DAY 90 tablet 0   cyanocobalamin (VITAMIN B12) 500 MCG tablet Take 500 mcg by mouth daily.     ferrous sulfate  (FEROSUL) 325 (65 FE) MG tablet TAKE 1 TABLET BY MOUTH DAILY 90 tablet 1   glipiZIDE  (GLUCOTROL ) 5 MG tablet TAKE 1 TABLET(5 MG) BY MOUTH TWICE DAILY BEFORE A MEAL 180 tablet 1   glucose blood (RIGHTEST GS550 BLOOD GLUCOSE) test strip Use as directed to check blood sugar up to four times daily. 100 strip 0   losartan  (COZAAR ) 50 MG tablet TAKE 1 TABLET(50 MG) BY MOUTH AT BEDTIME 90 tablet 1   Magnesium  Oxide 400 MG CAPS Take 1 capsule (400 mg total) by mouth daily. 30 capsule 1   Multiple Vitamin (MULTIVITAMIN) tablet Take 1 tablet by mouth daily. 30 tablet 1   Rightest GL300 Lancets MISC Use as directed to check blood sugar up to four  times daily. 100 each 0   traZODone  (DESYREL ) 50 MG tablet TAKE 1 AND 1/2 TABLETS(75 MG) BY MOUTH AT BEDTIME AS NEEDED FOR SLEEP 45 tablet 11   No facility-administered medications prior to visit.    Review of Systems;  Patient denies headache, fevers, malaise, unintentional weight loss, skin rash, eye pain, sinus congestion and sinus pain, sore throat, dysphagia,  hemoptysis , cough, dyspnea, wheezing, chest pain, palpitations, orthopnea, edema, abdominal pain, nausea, melena, diarrhea, constipation, flank pain, dysuria, hematuria, urinary  Frequency, nocturia, numbness, tingling, seizures,  Focal weakness, Loss of consciousness,  Tremor,  insomnia, depression, anxiety, and suicidal ideation.      Objective:  BP 138/70 (BP Location: Left Arm)   Pulse 72   Temp 98.2 F (36.8 C)   Ht 5' 5 (1.651 m)   Wt 156 lb 6.4 oz (70.9 kg)   SpO2 97%   BMI 26.03 kg/m   BP Readings from Last 3 Encounters:  06/19/24 138/70  09/27/23 136/70  12/07/22 (!) 162/84    Wt Readings from Last 3 Encounters:  06/19/24 156 lb 6.4 oz (70.9 kg)  09/27/23 150 lb (68 kg)  12/07/22 152 lb 3.2 oz (69 kg)    Physical Exam Vitals reviewed.  Constitutional:      General: She is not in acute distress.    Appearance: Normal appearance. She is normal weight. She is not ill-appearing, toxic-appearing or diaphoretic.  HENT:     Head: Normocephalic.  Eyes:     General: No scleral icterus.       Right eye: No discharge.        Left eye: No discharge.     Conjunctiva/sclera: Conjunctivae normal.  Cardiovascular:     Rate and Rhythm: Normal rate and regular rhythm.     Heart sounds: Normal heart sounds.  Pulmonary:     Effort: Pulmonary effort is normal. No respiratory distress.     Breath sounds: Normal breath sounds.  Musculoskeletal:        General: Normal range of motion.  Skin:    General: Skin is warm and dry.  Neurological:     General: No focal deficit present.     Mental Status: She is alert and oriented to person, place, and time. Mental status is at baseline.  Psychiatric:        Mood and Affect: Mood normal.        Behavior: Behavior normal.        Thought Content: Thought content normal.        Judgment: Judgment normal.     Lab Results  Component Value Date   HGBA1C 7.4 (H) 06/19/2024   HGBA1C 6.1 (A) 09/27/2023   HGBA1C 6.7 (H) 12/07/2022    Lab Results  Component Value Date   CREATININE 1.06 06/19/2024   CREATININE 1.02 09/27/2023   CREATININE 0.97 12/07/2022    Lab Results  Component Value Date   WBC 7.9 09/27/2023   HGB 11.8 (L) 09/27/2023   HCT 36.2 09/27/2023   PLT 299.0 09/27/2023   GLUCOSE 86  06/19/2024   CHOL 209 (H) 06/19/2024   TRIG 359.0 (H) 06/19/2024   HDL 44.40 06/19/2024   LDLDIRECT 131.0 06/19/2024   LDLCALC 93 06/19/2024   ALT 10 06/19/2024   AST 22 06/19/2024   NA 139 06/19/2024   K 3.8 06/19/2024   CL 103 06/19/2024   CREATININE 1.06 06/19/2024   BUN 17 06/19/2024   CO2 27 06/19/2024   TSH 1.06 09/27/2023  HGBA1C 7.4 (H) 06/19/2024   MICROALBUR 0.7 06/19/2024    No results found.  Assessment & Plan:  .Encounter for screening mammogram for malignant neoplasm of breast -     3D Screening Mammogram, Left and Right; Future  Alcoholism in recovery [facility]) Assessment & Plan: She reports  3 years of sobriety   Type 2 diabetes mellitus with hyperlipidemia (HCC) -     Hemoglobin A1c -     Microalbumin / creatinine urine ratio -     Lipid panel -     LDL cholesterol, direct -     Comprehensive metabolic panel with GFR  Postmenopausal estrogen deficiency -     DG Bone Density; Future  Colon cancer screening -     Cologuard  Need for pneumococcal 20-valent conjugate vaccination -     Pneumococcal conjugate vaccine 20-valent  Atherosclerosis of native artery of both lower extremities with intermittent claudication Assessment & Plan: She has deferred peripheral vascular evaluation in the past due to lack of insurance.  she has resumed statin and asa and has warm feet and palpable pulses today .    Type 2 diabetes mellitus with diabetic neuropathy, without long-term current use of insulin  [facility]) Assessment & Plan:  Foot exam is normal.   Diabetes is not  currently controlled with sulfonylurea.   Adding SGT2 inhibitor.  For concurrent CAD  Continue statin (dose increased to 80 mg and resume ARB   This SmartLink has not been configured with any valid records.   Lab Results  Component Value Date   CHOL 209 (H) 06/19/2024   HDL 44.40 06/19/2024   LDLCALC 93 06/19/2024   LDLDIRECT 131.0 06/19/2024   TRIG 359.0 (H) 06/19/2024   CHOLHDL 5 06/19/2024       Essential hypertension Assessment & Plan: Not at goal.  Will increase amlodipine  to 10mg   and continue 50 mg losartan .  She has no proteinuria   Lab Results  Component Value Date   MICROALBUR 0.7 06/19/2024   MICROALBUR <0.7 09/27/2023        Hyperlipidemia with target LDL less than 70 Assessment & Plan: Not at goal on current atorvastatin  dose. Will increase to 80 mg   Lab Results  Component Value Date   CHOL 209 (H) 06/19/2024   HDL 44.40 06/19/2024   LDLCALC 93 06/19/2024   LDLDIRECT 131.0 06/19/2024   TRIG 359.0 (H) 06/19/2024   CHOLHDL 5 06/19/2024         Follow-up: Return in about 6 months (around 12/17/2024) for physical WITH PAP SMEAR .   Sharon LITTIE Kettering, MD  "

## 2024-06-19 NOTE — Assessment & Plan Note (Addendum)
"   Foot exam is normal.   Diabetes is not  currently controlled with sulfonylurea.   Adding SGT2 inhibitor.  For concurrent CAD  Continue statin (dose increased to 80 mg and resume ARB   This SmartLink has not been configured with any valid records.   Lab Results  Component Value Date   CHOL 209 (H) 06/19/2024   HDL 44.40 06/19/2024   LDLCALC 93 06/19/2024   LDLDIRECT 131.0 06/19/2024   TRIG 359.0 (H) 06/19/2024   CHOLHDL 5 06/19/2024    "

## 2024-06-19 NOTE — Assessment & Plan Note (Addendum)
 She has deferred peripheral vascular evaluation in the past due to lack of insurance.  she has resumed statin and asa and has warm feet and palpable pulses today .

## 2024-06-19 NOTE — Patient Instructions (Addendum)
 Your annual mammogram AND  DEXA  SCAN have been ordered.  Please call [facility] to call to make your appointments  .  The phone number for Sharon Melton is  [phone]    You received the Prevnar  20 vaccine today  Your blood pressure is still high.  Depending on the result of today's urine screen for protein,  I will either increase the amlodipine  OR the losartan .  You will still need both medications, however.   Please check your blood pressure  ONCE A WEEK at home and send me the readings  in one month so I can determine if you need to start an additional  medication to lower your blood pressure .

## 2024-06-20 LAB — COMPREHENSIVE METABOLIC PANEL WITH GFR
ALT: 10 U/L (ref 3–35)
AST: 22 U/L (ref 5–37)
Albumin: 4.7 g/dL (ref 3.5–5.2)
Alkaline Phosphatase: 55 U/L (ref 39–117)
BUN: 17 mg/dL (ref 6–23)
CO2: 27 meq/L (ref 19–32)
Calcium: 10.4 mg/dL (ref 8.4–10.5)
Chloride: 103 meq/L (ref 96–112)
Creatinine, Ser: 1.06 mg/dL (ref 0.40–1.20)
GFR: 55.23 mL/min — ABNORMAL LOW
Glucose, Bld: 86 mg/dL (ref 70–99)
Potassium: 3.8 meq/L (ref 3.5–5.1)
Sodium: 139 meq/L (ref 135–145)
Total Bilirubin: 0.4 mg/dL (ref 0.2–1.2)
Total Protein: 7 g/dL (ref 6.0–8.3)

## 2024-06-20 LAB — LIPID PANEL
Cholesterol: 209 mg/dL — ABNORMAL HIGH (ref 28–200)
HDL: 44.4 mg/dL
LDL Cholesterol: 93 mg/dL (ref 10–99)
NonHDL: 164.78
Total CHOL/HDL Ratio: 5
Triglycerides: 359 mg/dL — ABNORMAL HIGH (ref 10.0–149.0)
VLDL: 71.8 mg/dL — ABNORMAL HIGH (ref 0.0–40.0)

## 2024-06-20 LAB — HEMOGLOBIN A1C: Hgb A1c MFr Bld: 7.4 % — ABNORMAL HIGH (ref 4.6–6.5)

## 2024-06-20 LAB — LDL CHOLESTEROL, DIRECT: Direct LDL: 131 mg/dL

## 2024-06-20 LAB — MICROALBUMIN / CREATININE URINE RATIO
Creatinine,U: 100.4 mg/dL
Microalb Creat Ratio: 7.2 mg/g (ref 0.0–30.0)
Microalb, Ur: 0.7 mg/dL (ref 0.7–1.9)

## 2024-06-23 ENCOUNTER — Ambulatory Visit: Payer: Self-pay | Admitting: Internal Medicine

## 2024-06-23 DIAGNOSIS — E785 Hyperlipidemia, unspecified: Secondary | ICD-10-CM

## 2024-06-23 DIAGNOSIS — I10 Essential (primary) hypertension: Secondary | ICD-10-CM

## 2024-06-23 DIAGNOSIS — E1140 Type 2 diabetes mellitus with diabetic neuropathy, unspecified: Secondary | ICD-10-CM

## 2024-06-23 MED ORDER — AMLODIPINE BESYLATE 10 MG PO TABS: ORAL_TABLET | ORAL | 1 refills | Status: AC

## 2024-06-23 MED ORDER — DAPAGLIFLOZIN PROPANEDIOL 5 MG PO TABS: 5.0000 mg | ORAL_TABLET | Freq: Every day | ORAL | 3 refills | Status: AC

## 2024-06-23 MED ORDER — ATORVASTATIN CALCIUM 80 MG PO TABS: 80.0000 mg | ORAL_TABLET | Freq: Every day | ORAL | 1 refills | Status: AC

## 2024-06-23 NOTE — Assessment & Plan Note (Signed)
 Not at goal on current atorvastatin  dose. Will increase to 80 mg   Lab Results  Component Value Date   CHOL 209 (H) 06/19/2024   HDL 44.40 06/19/2024   LDLCALC 93 06/19/2024   LDLDIRECT 131.0 06/19/2024   TRIG 359.0 (H) 06/19/2024   CHOLHDL 5 06/19/2024

## 2024-06-23 NOTE — Addendum Note (Signed)
 Addended by: MARYLYNN VERNEITA CROME on: 06/23/2024 02:57 PM   Modules accepted: Orders

## 2024-07-24 ENCOUNTER — Other Ambulatory Visit

## 2024-07-24 ENCOUNTER — Encounter

## 2024-09-25 ENCOUNTER — Other Ambulatory Visit

## 2024-10-02 ENCOUNTER — Ambulatory Visit: Admitting: Internal Medicine
